# Patient Record
Sex: Male | Born: 1975 | Race: Black or African American | Hispanic: No | Marital: Married | Smoking: Former smoker
Health system: Southern US, Community
[De-identification: ages and names within clinical notes are randomized; demographics above are authoritative.]

## PROBLEM LIST (undated history)

## (undated) DIAGNOSIS — K920 Hematemesis: Secondary | ICD-10-CM

## (undated) DIAGNOSIS — R0602 Shortness of breath: Secondary | ICD-10-CM

## (undated) DIAGNOSIS — K219 Gastro-esophageal reflux disease without esophagitis: Secondary | ICD-10-CM

## (undated) DIAGNOSIS — J439 Emphysema, unspecified: Secondary | ICD-10-CM

## (undated) DIAGNOSIS — K921 Melena: Secondary | ICD-10-CM

## (undated) DIAGNOSIS — G709 Myoneural disorder, unspecified: Secondary | ICD-10-CM

## (undated) DIAGNOSIS — I10 Essential (primary) hypertension: Principal | ICD-10-CM

## (undated) DIAGNOSIS — Q8501 Neurofibromatosis, type 1: Secondary | ICD-10-CM

## (undated) DIAGNOSIS — F1010 Alcohol abuse, uncomplicated: Secondary | ICD-10-CM

## (undated) DIAGNOSIS — J45909 Unspecified asthma, uncomplicated: Secondary | ICD-10-CM

## (undated) DIAGNOSIS — R42 Dizziness and giddiness: Secondary | ICD-10-CM

## (undated) DIAGNOSIS — J449 Chronic obstructive pulmonary disease, unspecified: Secondary | ICD-10-CM

## (undated) DIAGNOSIS — J40 Bronchitis, not specified as acute or chronic: Secondary | ICD-10-CM

## (undated) DIAGNOSIS — Z87891 Personal history of nicotine dependence: Secondary | ICD-10-CM

## (undated) DIAGNOSIS — G479 Sleep disorder, unspecified: Secondary | ICD-10-CM

## (undated) HISTORY — DX: Essential (primary) hypertension: I10

## (undated) HISTORY — DX: Chronic obstructive pulmonary disease, unspecified: J44.9

## (undated) HISTORY — DX: Neurofibromatosis, type 1: Q85.01

## (undated) HISTORY — DX: Bronchitis, not specified as acute or chronic: J40

## (undated) HISTORY — PX: UPPER GASTROINTESTINAL ENDOSCOPY: SHX188

## (undated) HISTORY — DX: Personal history of nicotine dependence: Z87.891

## (undated) HISTORY — DX: Unspecified asthma, uncomplicated: J45.909

## (undated) HISTORY — DX: Emphysema, unspecified: J43.9

## (undated) HISTORY — PX: OTHER SURGICAL HISTORY: SHX169

## (undated) HISTORY — DX: Myoneural disorder, unspecified: G70.9

---

## 1998-10-23 ENCOUNTER — Emergency Department (HOSPITAL_COMMUNITY): Admission: EM | Admit: 1998-10-23 | Discharge: 1998-10-23 | Payer: Self-pay | Admitting: Emergency Medicine

## 1999-06-29 ENCOUNTER — Emergency Department (HOSPITAL_COMMUNITY): Admission: EM | Admit: 1999-06-29 | Discharge: 1999-06-30 | Payer: Self-pay

## 2002-07-30 ENCOUNTER — Emergency Department (HOSPITAL_COMMUNITY): Admission: EM | Admit: 2002-07-30 | Discharge: 2002-07-30 | Payer: Self-pay | Admitting: Emergency Medicine

## 2004-11-05 ENCOUNTER — Emergency Department (HOSPITAL_COMMUNITY): Admission: EM | Admit: 2004-11-05 | Discharge: 2004-11-05 | Payer: Self-pay | Admitting: *Deleted

## 2005-01-30 ENCOUNTER — Emergency Department (HOSPITAL_COMMUNITY): Admission: EM | Admit: 2005-01-30 | Discharge: 2005-01-30 | Payer: Self-pay | Admitting: Emergency Medicine

## 2006-04-04 ENCOUNTER — Emergency Department (HOSPITAL_COMMUNITY): Admission: EM | Admit: 2006-04-04 | Discharge: 2006-04-04 | Payer: Self-pay | Admitting: Emergency Medicine

## 2006-04-06 ENCOUNTER — Emergency Department (HOSPITAL_COMMUNITY): Admission: EM | Admit: 2006-04-06 | Discharge: 2006-04-06 | Payer: Self-pay | Admitting: Emergency Medicine

## 2011-09-04 ENCOUNTER — Other Ambulatory Visit: Payer: Self-pay

## 2011-09-04 ENCOUNTER — Encounter (HOSPITAL_COMMUNITY): Payer: Self-pay

## 2011-09-04 ENCOUNTER — Emergency Department (HOSPITAL_COMMUNITY)
Admission: EM | Admit: 2011-09-04 | Discharge: 2011-09-04 | Disposition: A | Discharge status: Home / Self Care | Payer: Self-pay | Attending: Emergency Medicine | Admitting: Emergency Medicine

## 2011-09-04 ENCOUNTER — Emergency Department (HOSPITAL_COMMUNITY): Payer: Self-pay

## 2011-09-04 DIAGNOSIS — F17200 Nicotine dependence, unspecified, uncomplicated: Secondary | ICD-10-CM | POA: Insufficient documentation

## 2011-09-04 DIAGNOSIS — L909 Atrophic disorder of skin, unspecified: Secondary | ICD-10-CM | POA: Insufficient documentation

## 2011-09-04 DIAGNOSIS — R0789 Other chest pain: Secondary | ICD-10-CM

## 2011-09-04 DIAGNOSIS — R071 Chest pain on breathing: Secondary | ICD-10-CM | POA: Insufficient documentation

## 2011-09-04 LAB — COMPREHENSIVE METABOLIC PANEL
Alkaline Phosphatase: 93 U/L (ref 39–117)
BUN: 8 mg/dL (ref 6–23)
Chloride: 98 mEq/L (ref 96–112)
Creatinine, Ser: 0.83 mg/dL (ref 0.50–1.35)
GFR calc Af Amer: >90 mL/min (ref >90)
GFR calc non Af Amer: >90 mL/min (ref >90)
Glucose, Bld: 105 mg/dL — ABNORMAL HIGH (ref 70–99)
Potassium: 4.7 mEq/L (ref 3.5–5.1)
Total Bilirubin: 0.4 mg/dL (ref 0.3–1.2)

## 2011-09-04 LAB — POCT I-STAT TROPONIN I: Troponin i, poc: 0 ng/mL (ref 0.00–0.08)

## 2011-09-04 LAB — CBC
HCT: 46.6 % (ref 39.0–52.0)
Hemoglobin: 16.2 g/dL (ref 13.0–17.0)
MCH: 32.3 pg (ref 26.0–34.0)
MCHC: 34.8 g/dL (ref 30.0–36.0)
RBC: 5.02 MIL/uL (ref 4.22–5.81)

## 2011-09-04 LAB — DIFFERENTIAL
Lymphs Abs: 1.9 10*3/uL (ref 0.7–4.0)
Monocytes Absolute: 0.8 10*3/uL (ref 0.1–1.0)
Monocytes Relative: 11 % (ref 3–12)
Neutro Abs: 3.9 10*3/uL (ref 1.7–7.7)
Neutrophils Relative %: 55 % (ref 43–77)

## 2011-09-04 MED ORDER — NAPROXEN 500 MG PO TABS
500.0000 mg | ORAL_TABLET | Freq: Once | ORAL | Status: AC
  Administered 2011-09-04: 500 mg via ORAL
  Filled 2011-09-04: qty 1

## 2011-09-04 MED ORDER — NAPROXEN 500 MG PO TABS: 500.0000 mg | ORAL_TABLET | Freq: Two times a day (BID) | ORAL | Status: DC

## 2011-09-04 MED ORDER — HYDROCODONE-ACETAMINOPHEN 5-325 MG PO TABS: ORAL_TABLET | ORAL | Status: AC

## 2011-09-04 NOTE — ED Notes (Signed)
Pt in from home with left side chest pain states ongoing the past 2 days describes as tightness denies n/v denies sob denies pain radiating to the arm

## 2011-09-04 NOTE — ED Provider Notes (Signed)
History     CSN: [id number]  Arrival date & time 09/04/11  1159   First MD Initiated Contact with Patient 09/04/11 1611      Chief Complaint  Patient presents with  . Chest Pain    stataes left side of ribs    (Consider location/radiation/quality/duration/timing/severity/associated sxs/prior treatment) HPI Comments: Patient presents with constant, reproducible, aching chest pain to the middle of his chest for the past 2 days.  No injury or initiating event at onset. The pain is worse with movement and with deep breathing. Patient also complains of left rib pain as well. No history of high blood pressure, high cholesterol, diabetes, family history. Patient smokes cigarettes. Patient denies history of blood clots, recent immobilizations or surgeries, lower extremity edema. No fever, cough, shortness of breath, abdominal pain, nausea/vomiting/diarrhea, urinary symptoms. No blood in urine. Denies elicit drug or cocaine use.  Patient is a 36 y.o. male presenting with chest pain. The history is provided by the patient.  Chest Pain The chest pain began 2 days ago. Chest pain occurs constantly. The chest pain is unchanged. Associated with: nothing. The severity of the pain is mild. The quality of the pain is described as aching. The pain does not radiate. Pertinent negatives for primary symptoms include no fever, no shortness of breath, no cough, no palpitations, no abdominal pain, no nausea and no vomiting.  Pertinent negatives for associated symptoms include no diaphoresis. Risk factors include male gender.     History reviewed. No pertinent past medical history.  History reviewed. No pertinent past surgical history.  No family history on file.  History  Substance Use Topics  . Smoking status: Current Everyday Smoker  . Smokeless tobacco: Not on file  . Alcohol Use: No      Review of Systems  Constitutional: Negative for fever and diaphoresis.  HENT: Negative for neck pain.     Eyes: Negative for redness.  Respiratory: Negative for cough and shortness of breath.   Cardiovascular: Positive for chest pain. Negative for palpitations and leg swelling.  Gastrointestinal: Negative for nausea, vomiting and abdominal pain.  Genitourinary: Negative for dysuria and hematuria.  Musculoskeletal: Negative for back pain.  Skin: Negative for rash.  Neurological: Negative for syncope and light-headedness.    Allergies  Review of patient's allergies indicates no known allergies.  Home Medications  No current outpatient prescriptions on file.  There were no vitals taken for this visit.  Physical Exam  Nursing note and vitals reviewed. Constitutional: He is oriented to person, place, and time. He appears well-developed and well-nourished.  HENT:  Head: Normocephalic and atraumatic.  Mouth/Throat: Mucous membranes are normal. Mucous membranes are not dry.  Eyes: Conjunctivae are normal.  Neck: Trachea normal and normal range of motion. Neck supple. Normal carotid pulses and no JVD present. No muscular tenderness present. Carotid bruit is not present. No tracheal deviation present.  Cardiovascular: Normal rate, regular rhythm, S1 normal, S2 normal, normal heart sounds and intact distal pulses.  Exam reveals no distant heart sounds and no decreased pulses.   No murmur heard. Pulses:      Radial pulses are 2+ on the right side, and 2+ on the left side.  Pulmonary/Chest: Effort normal and breath sounds normal. No respiratory distress. He has no wheezes. He exhibits tenderness.       Reproducible tenderness over sternum. No trauma noted. Multiple skin tags noted.   Abdominal: Soft. Normal aorta and bowel sounds are normal. There is no tenderness. There is no  rebound, no guarding and no CVA tenderness.  Musculoskeletal: He exhibits no edema.  Neurological: He is alert and oriented to person, place, and time.  Skin: Skin is warm and dry. He is not diaphoretic. No cyanosis. No  pallor.  Psychiatric: He has a normal mood and affect.    ED Course  Procedures (including critical care time)  Labs Reviewed  DIFFERENTIAL - Abnormal; Notable for the following:    Eosinophils Relative 6 (*)    All other components within normal limits  COMPREHENSIVE METABOLIC PANEL - Abnormal; Notable for the following:    Sodium 134 (*)    Glucose, Bld 105 (*)    AST 60 (*)    All other components within normal limits  CBC  POCT I-STAT TROPONIN I   Dg Chest 2 View  09/04/2011  *RADIOLOGY REPORT*  Clinical Data: Left-sided chest pain, smoker  CHEST - 2 VIEW  Comparison: None.  Findings: Normal cardiac silhouette and mediastinal contours.  The lungs are mildly hyperinflated.  There is mild diffuse increased conspicuity the pulmonary interstitium. No focal airspace opacities.  No pleural effusion or pneumothorax.  No acute osseous abnormalities.  IMPRESSION: Hyperexpanded lungs with increased interstitial thickening suggestive of airways disease/bronchitis. No focal airspace opacities to suggest pneumonia.  Original Report Authenticated By: Waynard Reeds, M.D.     1. Chest wall pain     4:22 PM Patient seen and examined. Work-up reviewed. CXR ordered. Medications ordered.    Date: 09/04/2011  Rate: 87  Rhythm: normal sinus rhythm  QRS Axis: normal  Intervals: normal  ST/T Wave abnormalities: normal  Conduction Disutrbances:none  Narrative Interpretation:   Old EKG Reviewed: none available  Do not suspect cardiac etiology.   Chest x-ray was reviewed by myself. No pneumothorax. Patient informed of results. Will give trial of anti-inflammatories and pain medication.   Patient was counseled to return with severe chest pain, especially if the pain is crushing or pressure-like and spreads to the arms, back, neck, or jaw, or if they have sweating, nausea, or shortness of breath with the pain. They were encouraged to call 911 with these symptoms.   They were also told to return  if their chest pain gets worse and does not go away with rest, they have an attack of chest pain lasting longer than usual despite rest and treatment with the medications their caregiver has prescribed, if they wake from sleep with chest pain or shortness of breath, if they feel dizzy or faint, if they have chest pain not typical of their usual pain, or if they have any other emergent concerns regarding their health.  The patient verbalized understanding and agreed.    MDM  Patient with chest reproducible chest wall pain, normal EKG, normal vitals.  Pain constant for 2 days. Trop neg. Do not suspect pericarditis or PNA. No concern for cardiac etiology. Do not suspect mild elevation in AST is significant or related.         Renne Crigler, [place] 09/04/11 2023

## 2011-09-04 NOTE — ED Provider Notes (Signed)
Medical screening examination/treatment/procedure(s) were performed by non-physician practitioner and as supervising physician I was immediately available for consultation/collaboration.  Talon Regala R Sher Hellinger, MD 09/04/11 2255 

## 2011-09-04 NOTE — ED Notes (Signed)
EKG GIVEN TO DR. HUNT

## 2011-09-04 NOTE — Discharge Instructions (Signed)
Please read and follow all provided instructions.  Your diagnoses today include:  1. Chest wall pain     Tests performed today include:  An EKG of your heart  A chest x-ray  Cardiac enzymes - a blood test for heart muscle damage  Blood counts and electrolytes  Vital signs. See below for your results today.   Medications prescribed:   Vicodin (hydrocodone/acetaminophen) for pain - narcotic pain medication  If you have been prescribed narcotic pain medication such as Vicodin or Percocet: DO NOT drive or perform any activities that require you to be awake and alert because this medicine can make you drowsy. Do not take any other medications that contain Tylenol (also called acetaminophen) while taking this medication because you might take too much.    Naproxen - anti-inflammatory pain medicine  Do not exceed 2 tablets (1000mg ) in 24 hours  Take any prescribed medications only as directed.  Follow-up instructions: Please follow-up with your primary care provider as soon as you can for further evaluation of your symptoms. If you do not have a primary care doctor -- see below for referral information.   Return instructions:  SEEK IMMEDIATE MEDICAL ATTENTION IF:  You have severe chest pain, especially if the pain is crushing or pressure-like and spreads to the arms, back, neck, or jaw, or if you have sweating, nausea (feeling sick to your stomach), or shortness of breath. THIS IS AN EMERGENCY. Don't wait to see if the pain will go away. Get medical help at once. Call 911 or 0 (operator). DO NOT drive yourself to the hospital.   Your chest pain gets worse and does not go away with rest.   You have an attack of chest pain lasting longer than usual, despite rest and treatment with the medications your caregiver has prescribed.   You wake from sleep with chest pain or shortness of breath.  You feel dizzy or faint.  You have chest pain not typical of your usual pain for which you  originally saw your caregiver.   You have any other emergent concerns regarding your health.  Additional Information: Chest pain comes from many different causes. Your caregiver has diagnosed you as having chest pain that is not specific for one problem, but does not require admission.  You are at low risk for an acute heart condition or other serious illness.   Your vital signs today were: BP 141/80  Pulse 71  Resp 18  SpO2 98% If your blood pressure (BP) was elevated above 135/85 this visit, please have this repeated by your doctor within one month. -------------- No Primary Care Doctor Call [facility]  [phone] Other agencies that provide inexpensive medical care    [facility]  [phone]    [facility]  [phone]    [facility]  [phone]    [facility]  [phone]    [facility]  [phone]    [facility]  [phone] -------------- RESOURCE GUIDE:  Dental Problems  Patients with Medicaid: [facility] [phone] [address]                                            [phone] [address] Phone:  [phone]  Phone:  [phone]  If unable to pay or uninsured, contact:  [facility] or [facility]. to become qualified for the adult dental clinic.  Chronic Pain Problems Contact [facility]  [phone] Patients need to be referred by their primary care doctor.  Insufficient Money for Medicine Contact [employer]:  call "211" or [facility] [phone].  Psychological Services [facility]  [phone] [facility]  [phone] [facility]   [phone] (emergency services [phone])  Substance Abuse Resources [facility]  [phone] [facility] [phone] [facility] [place] [phone] Floydene Flock  [phone] Residential & Outpatient Substance Abuse Program  [phone]  Abuse/Neglect [facility] Child Abuse Hotline [phone] [facility] Child Abuse Hotline [phone] (After Hours)  Emergency Shelter [facility] [phone]  Maternity Homes [facility] at [facility] [phone] [place] [facility] [phone]  [facility] Resources  [facility]     [employer]                          [facility] Dept. [address] [place]                       [address]      [address]  Blondell Reveal Phone:  [phone]                                   Phone:  [phone]                 Phone:  [phone]  [facility] Phone:  [phone]  [facility] Child Abuse Hotline 2482117224 [phone] (After Hours)

## 2011-10-29 ENCOUNTER — Emergency Department (HOSPITAL_COMMUNITY): Payer: Self-pay

## 2011-10-29 ENCOUNTER — Encounter (HOSPITAL_COMMUNITY): Payer: Self-pay

## 2011-10-29 ENCOUNTER — Emergency Department (HOSPITAL_COMMUNITY)
Admission: EM | Admit: 2011-10-29 | Discharge: 2011-10-29 | Disposition: A | Discharge status: Home / Self Care | Payer: Self-pay | Attending: Emergency Medicine | Admitting: Emergency Medicine

## 2011-10-29 DIAGNOSIS — X58XXXA Exposure to other specified factors, initial encounter: Secondary | ICD-10-CM | POA: Insufficient documentation

## 2011-10-29 DIAGNOSIS — Q8500 Neurofibromatosis, unspecified: Secondary | ICD-10-CM | POA: Insufficient documentation

## 2011-10-29 DIAGNOSIS — S4490XA Injury of unspecified nerve at shoulder and upper arm level, unspecified arm, initial encounter: Secondary | ICD-10-CM | POA: Insufficient documentation

## 2011-10-29 DIAGNOSIS — J438 Other emphysema: Secondary | ICD-10-CM | POA: Insufficient documentation

## 2011-10-29 DIAGNOSIS — F17200 Nicotine dependence, unspecified, uncomplicated: Secondary | ICD-10-CM | POA: Insufficient documentation

## 2011-10-29 DIAGNOSIS — R0789 Other chest pain: Secondary | ICD-10-CM

## 2011-10-29 DIAGNOSIS — R071 Chest pain on breathing: Secondary | ICD-10-CM | POA: Insufficient documentation

## 2011-10-29 DIAGNOSIS — R079 Chest pain, unspecified: Secondary | ICD-10-CM | POA: Insufficient documentation

## 2011-10-29 LAB — POCT I-STAT, CHEM 8
Calcium, Ion: 1.23 mmol/L (ref 1.12–1.32)
Glucose, Bld: 85 mg/dL (ref 70–99)
HCT: 48 % (ref 39.0–52.0)
Hemoglobin: 16.3 g/dL (ref 13.0–17.0)
TCO2: 22 mmol/L (ref 0–100)

## 2011-10-29 MED ORDER — GADOBENATE DIMEGLUMINE 529 MG/ML IV SOLN
15.0000 mL | Freq: Once | INTRAVENOUS | Status: AC
  Administered 2011-10-29: 15 mL via INTRAVENOUS

## 2011-10-29 MED ORDER — IOHEXOL 300 MG/ML  SOLN
80.0000 mL | Freq: Once | INTRAMUSCULAR | Status: AC | PRN
  Administered 2011-10-29: 80 mL via INTRAVENOUS

## 2011-10-29 MED ORDER — HYDROCODONE-ACETAMINOPHEN 5-325 MG PO TABS: 1.0000 | ORAL_TABLET | ORAL | Status: AC | PRN

## 2011-10-29 MED ORDER — KETOROLAC TROMETHAMINE 60 MG/2ML IM SOLN
60.0000 mg | Freq: Once | INTRAMUSCULAR | Status: AC
  Administered 2011-10-29: 60 mg via INTRAMUSCULAR
  Filled 2011-10-29: qty 2

## 2011-10-29 NOTE — ED Provider Notes (Addendum)
History  This chart was scribed for James Racer, MD by [name]. This patient was seen in room STRE3/STRE3 and the patient's care was started at 1:21PM.  CSN: [id number]  Arrival date & time 10/29/11  1135   First MD Initiated Contact with Patient 10/29/11 1321      Chief Complaint  Patient presents with  . Chest Pain    Patient is a 36 y.o. male presenting with chest pain. The history is provided by the patient. No language interpreter was used.  Chest Pain The chest pain began 1 - 2 weeks ago. Chest pain occurs constantly. The chest pain is worsening. At its most intense, the pain is at 8/10. The pain is currently at 5/10. The pain does not radiate. Pertinent negatives for primary symptoms include no fever, no shortness of breath, no cough, no nausea and no vomiting.  Associated symptoms include numbness and weakness. Risk factors include smoking/tobacco exposure.  Pertinent negatives for past medical history include no CAD and no MI.     James [name] is a 35 y.o. male with no h/o chronic medical conditions who presents to the Emergency Department complaining of 2 weeks of gradual onset, gradually worsening, constant left-sided chest pain with associated intermittent left arm numbness that pt states he woke up with. The pain is worse with touch and pressure. Pt states that the arm numbness is worse with lifting up his left arm. He states that he was recently diagnosed with bronchitis by his PCP. He denies injury as the cause of the symptoms. Pt states that he was seen at [facility] and by his PCP for the symptoms. He states that he was prescribed Vicodin after trying Naproxen and Prednisone with no improvement in his symptoms. He reports that he has been taking Vicodin with moderate improvement in symptoms.  He denies nausea, diaphoresis and weakness as associated symptoms. He is a current everyday smoker but denies alcohol use.  No past medical history on file.  No past surgical  history on file.  No family history on file.  History  Substance Use Topics  . Smoking status: Current Everyday Smoker  . Smokeless tobacco: Not on file  . Alcohol Use: No      Review of Systems  Constitutional: Negative for fever and chills.  Respiratory: Negative for cough and shortness of breath.   Cardiovascular: Positive for chest pain.  Gastrointestinal: Negative for nausea and vomiting.  Neurological: Positive for weakness and numbness.    Allergies  Review of patient's allergies indicates no known allergies.  Home Medications   Current Outpatient Rx  Name Route Sig Dispense Refill  . HYDROCODONE-ACETAMINOPHEN 5-500 MG PO TABS Oral Take 1 tablet by mouth 2 (two) times daily as needed. For pain      Triage Vitals: BP 139/91  Pulse 85  Temp(Src) 98.2 F (36.8 C) (Oral)  Resp 20  SpO2 96%  Physical Exam  Nursing note and vitals reviewed. Constitutional: He is oriented to person, place, and time. He appears well-developed and well-nourished. No distress.  HENT:  Head: Normocephalic and atraumatic.  Eyes: EOM are normal.  Neck: Neck supple. No tracheal deviation present.  Cardiovascular: Normal rate and regular rhythm.   Pulmonary/Chest: Effort normal and breath sounds normal. No respiratory distress. He exhibits tenderness (left parasternal chest tenderness to palpation).  Musculoskeletal: Normal range of motion.       4/5 weakness in left grip strength   Neurological: He is alert and oriented to person, place, and  time.       Generalized decreased sensation in LUE  Skin: Skin is warm and dry.       Lesions on the arms that appear to be neurofibromas   Psychiatric: He has a normal mood and affect. His behavior is normal.    ED Course  Procedures (including critical care time)  DIAGNOSTIC STUDIES: Oxygen Saturation is 96% on room air, adequate by my interpretation.    COORDINATION OF CARE: 1:30PM-Discussed treatment plan which includes chest x-ray with  pt and pt agreed to plan. Advised pt to try ibuprofen for the symptoms. Pt is requesting medication now for the pain. 2:21PM-Informed pt of CT scan and pt agreed.  2:41PM-Pt rechecked and states that he has a brother with a h/o neurofibromatosis.  5:08PM-Informed pt of radiology results and of radiologist's recommendation to have a MRI performed. Pt agreed to MRI.  7:41PM-Informed pt of MRI results. Discussed discharge plan with pt and pt agreed to plan. Advised pt to follow up with PCP.   Labs Reviewed  POCT I-STAT, CHEM 8 - Abnormal; Notable for the following:    BUN 4 (*)    All other components within normal limits  POCT I-STAT TROPONIN I   Dg Chest 2 View  10/29/2011  *RADIOLOGY REPORT*  Clinical Data: Chest pain.  CHEST - 2 VIEW  Comparison: Chest radiograph 09/04/2011  Findings: Lung expansion is upper normal to mildly increased.  This could be due to good inspiratory effort versus mild hyperinflation. The lungs are clear.  There is no pleural effusion or pneumothorax. The bones appear within normal limits.  IMPRESSION: Mild hyperexpansion versus good inspiratory effort on the part of the patient.  The lungs are clear.  Original Report Authenticated By: Britta Mccreedy, M.D.   Ct Chest W Contrast  10/29/2011  *RADIOLOGY REPORT*  Clinical Data: Chest pain  CT CHEST WITH CONTRAST  Technique:  Multidetector CT imaging of the chest was performed following the standard protocol during bolus administration of intravenous contrast.  Contrast: 80mL OMNIPAQUE IOHEXOL 300 MG/ML  SOLN  Comparison: Chest radiograph 06/03/2013and CT abdomen 04/06/2006  Findings: Bilateral gynecomastia.  Heart size is normal. Negative for visible coronary artery calcifications.  Thoracic aorta is normal in caliber and enhancement.  Negative for aortic dissection. Great vessels, visualized portion of the thyroid gland, and esophagus have normal appearance.  Negative for hiatal hernia.  Negative for pleural pericardial effusion,  or lymphadenopathy in the chest.  On image #13 of the soft tissue windows is a pleural-based and smoothly marginated soft tissue nodule that measures 10 mm greatest diameter.  There is a focal area of skin thickening or focal skin lesion in the upper right chest, inferior to the clavicle.  Question skin mole. This is seen on image #6 of the axial images.  Suggest direct visualization.  There is paraseptal emphysema, with bullous changes at the lung apices, right greater than left. Additionally, there is mild centrilobular emphysema in the upper lobes bilaterally, age advanced.  There is no airspace disease, pulmonary mass, or pulmonary nodule.  There is some paraspinous soft tissue/fat density prominence, predominately to the left of midline, spanning from the T1-T2 disc level through the T2-T3 disc level. The left aspect of the T1 and T2 vertebral bodies have focal erosions with sclerotic margins, that have chronic appearances.  No acute bony abnormality is identified.  The imaged portion of the liver, adrenal glands, spleen, both kidneys, and pancreas are within normal limits.  IMPRESSION: 1.  Mild  centrilobular emphysema and paracentral emphysema. Emphysematous changes are age-advanced with the 35 the patient. 2.  Abnormal paraspinous soft tissue prominence centered at the T2 level.  The adjacent upper thoracic spine vertebral bodies have erosions with sclerotic margins. Findings are nonspecific, but potential considerations include a neurofibroma or schwannoma. Malignancy cannot be excluded.  Further evaluation with thoracic spine MRI with and without contrast is suggested. 3. Small, nonspecific left pleural based nodule adjacent to the third rib anteriorly. Question if this could have a similar etiology as the paraspinous soft tissue density.    3. Gynecomastia. 4.  Focal anterior right upper chest exophytic skin lesion. Suggest direct visualization.  Original Report Authenticated By: Britta Mccreedy, M.D.   Mr  Thoracic Spine W Wo Contrast  10/29/2011  *RADIOLOGY REPORT*  Clinical Data: Chest pain.  Left hand numbness. Multiple cutaneous skin tags.  History of a brother with neurofibromatosis.  MRI THORACIC SPINE WITHOUT AND WITH CONTRAST  Technique:  Multiplanar and multiecho pulse sequences of the thoracic spine were obtained without and with intravenous contrast.  Contrast: 15mL MULTIHANCE GADOBENATE DIMEGLUMINE 529 MG/ML IV SOLN  Comparison: Chest x-ray and CT chest earlier today.  Findings: As  demonstrated on prior imaging, there is a medial left paratracheal/apical lesion with scalloping of the adjacent vertebral bodies T1-T4.  The lesion surrounds the upper ribs but does not clearly result in osseous destruction or remodeling of those ribs. The lesion measures approximately 27 x 37 x 44 mm in cross section and demonstrates avid contrast enhancement. Slight enhancement can be seen in the T1-2 and T2-3 neural foramina, but there is no clear-cut foraminal widening.  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  No evidence for lateral meningocele, intraspinal meningioma, or dumbbell schwannoma.  Slight 2 mm anterolisthesis T1 and T2.  Mild annular bulging T1-2 disc space and T2-3 disc space.  No intraspinal lesions or neural foraminal narrowing. No intraspinal meningioma.  Apical bullous change in both lung fields.  No mediastinal masses.  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  No cord compression or intraspinal mass lesion.  It is possible that due to the proximity of this lesion to the brachial plexus, there may be some tethering of the lower cervical roots or trunks related to involvement of this plexiform  lesion.  Findings discussed with EDP.  Original Report Authenticated By: Elsie Stain, M.D.   Dg Shoulder Left  10/29/2011  *RADIOLOGY REPORT*  Clinical Data: Left chest and shoulder pain.  LEFT SHOULDER - 2+ VIEW  Comparison: Chest radiograph 09/04/2011  Findings: Three views of the left shoulder are negative for acute fracture or dislocation.  There may be a smooth curved pleural- based density near the left lateral 3rd rib.  This was present on the previous chest radiograph.  Negative for a pneumothorax.  IMPRESSION: No acute bony abnormality in the left shoulder.  There is a smooth density near the left lateral 3rd rib. It is unclear if this is extrapleural or pleural-based.  This could represent a lipoma but nonspecific.  If this is an area of clinical concern, recommend chest CT.  Original Report Authenticated By: Richarda Overlie, M.D.     No diagnosis found.   Date: 12/01/2011  Rate: 82  Rhythm: normal sinus rhythm  QRS Axis: normal  Intervals: normal  ST/T Wave abnormalities: nonspecific ST changes  Conduction Disutrbances:none  Narrative Interpretation:   Old EKG Reviewed: none available    MDM  I personally performed the services described in this documentation, which was scribed in my presence. The recorded information has been reviewed and considered.  Pt advised to F/u with PMD for specialist referrals as needed  James Racer, MD 10/29/11 1952  James Racer, MD 12/01/11 [phone]

## 2011-10-29 NOTE — ED Notes (Signed)
Pt complains of chest pain and left side numbness, seen at [facility] and pmd for same and no treatnement is helping.

## 2011-10-29 NOTE — Discharge Instructions (Signed)
Neurofibromatosis Neurofibromatosis is a genetic disorder of the nervous system. They mostly affect the development and growth of nerve cell tissues. This disorder causes tumors to grow on nerves. It can also cause skin changes and bone deformities. Scientists have classified the disorders as neurofibromatosis type 1 (NF1) and neurofibromatosis type 2 (NF2). Of the two, NF1 is the more common type. CAUSES  Many affected persons inherit the disorder. But between 30 and 50 percent of new cases arise through change (mutation) in an individual's genes. Once this change has taken place, the mutant gene can be passed on to future generations. SYMPTOMS  Early symptoms may include:  Ringing noise in the ear (tinnitus).   Poor balance.  Problems caused by pressure from the tumors include:  Headache.   Facial pain or numbness.   Symptoms of NF1, particularly those on the skin, are often evident at birth or during infancy. They almost always appear by the time a child is about 33 years old.   NF2 has bilateral (occurring on both sides of the body) tumors on the eighth cranial nerve. The tumors cause pressure damage to neighboring nerves.  DIAGNOSIS  To diagnose NF1, a caregiver looks for:  Changes in skin appearance.   Tumors.   Bone abnormalities.   A parent, sibling, or child with NF1.  To diagnose NF2, a caregiver looks for:  Bilateral eighth nerve tumors.   Similar signs and symptoms in a parent, sibling, or child. Affected individuals may notice hearing loss as early as the teen years.  TREATMENT  Treatments for both NF1 and NF2 are presently aimed at controlling symptoms.  Surgery can help some NF1 bone malformations. It can remove painful or disfiguring tumors. However, there is a chance that the tumors may grow back and in greater numbers. In the rare instances when tumors become malignant (3 to 5 percent of all cases), treatment may include surgery, radiation, or chemotherapy.    For NF2, improved diagnostic technologies, such as MRI, can reveal tumors as small as a few millimeters in diameter. This allows for early treatment. Surgery to remove tumors completely is one option. But it may result in hearing loss. Other options include partial removal of tumors or radiation. If the tumors are not progressing rapidly, the conservative approach is watchful waiting.   Genetic testing is available for families with documented cases of NF1 and NF2.   New (spontaneous) mutations cannot be confirmed genetically.   Prenatal diagnosis of familial NF1 or NF2 is also possible. Amniocentesis or chorionic villus sampling procedures would be used.  Document Released: 05/04/2002 Document Revised: 05/03/2011 Document Reviewed: 05/14/2005 [facility] Patient Information 2012 [place], [place].

## 2011-10-29 NOTE — ED Notes (Signed)
Also reports htn

## 2011-11-11 ENCOUNTER — Emergency Department (HOSPITAL_COMMUNITY)
Admission: EM | Admit: 2011-11-11 | Discharge: 2011-11-11 | Disposition: A | Discharge status: Home / Self Care | Payer: Medicaid Other | Attending: Emergency Medicine | Admitting: Emergency Medicine

## 2011-11-11 ENCOUNTER — Encounter (HOSPITAL_COMMUNITY): Payer: Self-pay | Admitting: Emergency Medicine

## 2011-11-11 ENCOUNTER — Emergency Department (HOSPITAL_COMMUNITY): Payer: Medicaid Other

## 2011-11-11 DIAGNOSIS — K2970 Gastritis, unspecified, without bleeding: Secondary | ICD-10-CM

## 2011-11-11 DIAGNOSIS — F17200 Nicotine dependence, unspecified, uncomplicated: Secondary | ICD-10-CM | POA: Insufficient documentation

## 2011-11-11 DIAGNOSIS — R109 Unspecified abdominal pain: Secondary | ICD-10-CM | POA: Insufficient documentation

## 2011-11-11 LAB — HEPATIC FUNCTION PANEL
ALT: 61 U/L — ABNORMAL HIGH (ref 0–53)
Alkaline Phosphatase: 89 U/L (ref 39–117)
Bilirubin, Direct: <0.1 mg/dL (ref 0.0–0.3)

## 2011-11-11 LAB — URINALYSIS, ROUTINE W REFLEX MICROSCOPIC
Bilirubin Urine: NEGATIVE
Hgb urine dipstick: NEGATIVE
Ketones, ur: 15 mg/dL — AB
Nitrite: NEGATIVE
Specific Gravity, Urine: 1.007 (ref 1.005–1.030)
Urobilinogen, UA: 0.2 mg/dL (ref 0.0–1.0)

## 2011-11-11 LAB — TYPE AND SCREEN
ABO/RH(D): O POS
Antibody Screen: NEGATIVE

## 2011-11-11 LAB — BASIC METABOLIC PANEL
BUN: 5 mg/dL — ABNORMAL LOW (ref 6–23)
Calcium: 10 mg/dL (ref 8.4–10.5)
GFR calc Af Amer: >90 mL/min (ref >90)
GFR calc non Af Amer: >90 mL/min (ref >90)
Glucose, Bld: 111 mg/dL — ABNORMAL HIGH (ref 70–99)
Sodium: 132 mEq/L — ABNORMAL LOW (ref 135–145)

## 2011-11-11 LAB — DIFFERENTIAL
Basophils Relative: 1 % (ref 0–1)
Eosinophils Absolute: 0.3 10*3/uL (ref 0.0–0.7)
Eosinophils Relative: 4 % (ref 0–5)
Lymphs Abs: 2 10*3/uL (ref 0.7–4.0)
Monocytes Relative: 13 % — ABNORMAL HIGH (ref 3–12)
Neutrophils Relative %: 54 % (ref 43–77)

## 2011-11-11 LAB — CBC
MCH: 32.3 pg (ref 26.0–34.0)
MCHC: 35.6 g/dL (ref 30.0–36.0)
MCV: 90.9 fL (ref 78.0–100.0)
Platelets: 244 10*3/uL (ref 150–400)
RBC: 4.95 MIL/uL (ref 4.22–5.81)

## 2011-11-11 MED ORDER — PANTOPRAZOLE SODIUM 20 MG PO TBEC: 20.0000 mg | DELAYED_RELEASE_TABLET | Freq: Every day | ORAL | Status: DC

## 2011-11-11 MED ORDER — SUCRALFATE 1 G PO TABS: 1.0000 g | ORAL_TABLET | Freq: Four times a day (QID) | ORAL | Status: DC

## 2011-11-11 MED ORDER — PANTOPRAZOLE SODIUM 40 MG IV SOLR
40.0000 mg | Freq: Once | INTRAVENOUS | Status: AC
  Administered 2011-11-11: 40 mg via INTRAVENOUS
  Filled 2011-11-11: qty 40

## 2011-11-11 NOTE — ED Provider Notes (Signed)
History     CSN: [id number]  Arrival date & time 11/11/11  2005   First MD Initiated Contact with Patient 11/11/11 2046      Chief Complaint  Patient presents with  . Hematemesis  . Abdominal Pain    (Consider location/radiation/quality/duration/timing/severity/associated sxs/prior treatment) Patient is a 36 y.o. male presenting with abdominal pain. The history is provided by the patient.  Abdominal Pain The primary symptoms of the illness include abdominal pain.  pt here with vomiting 25cc of bright red blood. Denies coffee ground emesis or dark stools. Admits to heavy daily etoh use. No fever. No prior h/o same. No tx don't prior to arrival. Slight sharp epigastric pain without radiation.   No past medical history on file.  No past surgical history on file.  No family history on file.  History  Substance Use Topics  . Smoking status: Current Everyday Smoker -- 0.5 packs/day  . Smokeless tobacco: Not on file  . Alcohol Use: No      Review of Systems  Gastrointestinal: Positive for abdominal pain.  All other systems reviewed and are negative.    Allergies  Review of patient's allergies indicates no known allergies.  Home Medications   Current Outpatient Rx  Name Route Sig Dispense Refill  . HYDROCODONE-ACETAMINOPHEN 5-500 MG PO TABS Oral Take 1 tablet by mouth 2 (two) times daily as needed. For pain      BP 135/79  Pulse 75  Temp 98.2 F (36.8 C) (Oral)  SpO2 100%  Physical Exam  Nursing note and vitals reviewed. Constitutional: He is oriented to person, place, and time. He appears well-developed and well-nourished.  Non-toxic appearance. No distress.  HENT:  Head: Normocephalic and atraumatic.  Eyes: Conjunctivae, EOM and lids are normal. Pupils are equal, round, and reactive to light.  Neck: Normal range of motion. Neck supple. No tracheal deviation present. No mass present.  Cardiovascular: Normal rate, regular rhythm and normal heart sounds.  Exam  reveals no gallop.   No murmur heard. Pulmonary/Chest: Effort normal and breath sounds normal. No stridor. No respiratory distress. He has no decreased breath sounds. He has no wheezes. He has no rhonchi. He has no rales.  Abdominal: Soft. Normal appearance and bowel sounds are normal. He exhibits no distension. There is no tenderness. There is no rigidity, no rebound, no guarding and no CVA tenderness.  Musculoskeletal: Normal range of motion. He exhibits no edema and no tenderness.  Neurological: He is alert and oriented to person, place, and time. He has normal strength. No cranial nerve deficit or sensory deficit. GCS eye subscore is 4. GCS verbal subscore is 5. GCS motor subscore is 6.  Skin: Skin is warm and dry. No abrasion and no rash noted.  Psychiatric: He has a normal mood and affect. His speech is normal and behavior is normal.    ED Course  Procedures (including critical care time)  Labs Reviewed  DIFFERENTIAL - Abnormal; Notable for the following:    Monocytes Relative 13 (*)     All other components within normal limits  CBC  TYPE AND SCREEN  URINALYSIS, ROUTINE W REFLEX MICROSCOPIC  BASIC METABOLIC PANEL  LIPASE, BLOOD  ABO/RH  HEPATIC FUNCTION PANEL   No results found.   No diagnosis found.    MDM  Pt given iv fluids and protonix--suspect pt has gastritis from his etoh use, will place on ppi and carafate        Toy Baker, MD 11/11/11 2315

## 2011-11-11 NOTE — Discharge Instructions (Signed)
Gastritis Gastritis is an inflammation (the body's way of reacting to injury and/or infection) of the stomach. It is often caused by viral or bacterial (germ) infections. It can also be caused by chemicals (including alcohol) and medications. This illness may be associated with generalized malaise (feeling tired, not well), cramps, and fever. The illness may last 2 to 7 days. If symptoms of gastritis continue, gastroscopy (looking into the stomach with a telescope-like instrument), biopsy (taking tissue samples), and/or blood tests may be necessary to determine the cause. Antibiotics will not affect the illness unless there is a bacterial infection present. One common bacterial cause of gastritis is an organism known as H. Pylori. This can be treated with antibiotics. Other forms of gastritis are caused by too much acid in the stomach. They can be treated with medications such as H2 blockers and antacids. Home treatment is usually all that is needed. Young children will quickly become dehydrated (loss of body fluids) if vomiting and diarrhea are both present. Medications may be given to control nausea. Medications are usually not given for diarrhea unless especially bothersome. Some medications slow the removal of the virus from the gastrointestinal tract. This slows down the healing process. HOME CARE INSTRUCTIONS Home care instructions for nausea and vomiting:  For adults: drink small amounts of fluids often. Drink at least 2 quarts a day. Take sips frequently. Do not drink large amounts of fluid at one time. This may worsen the nausea.   Only take over-the-counter or prescription medicines for pain, discomfort, or fever as directed by your caregiver.   Drink clear liquids only. Those are anything you can see through such as water, broth, or soft drinks.   Once you are keeping clear liquids down, you may start full liquids, soups, juices, and ice cream or sherbet. Slowly add bland (plain, not spicy)  foods to your diet.  Home care instructions for diarrhea:  Diarrhea can be caused by bacterial infections or a virus. Your condition should improve with time, rest, fluids, and/or anti-diarrheal medication.   Until your diarrhea is under control, you should drink clear liquids often in small amounts. Clear liquids include: water, broth, jell-o water and weak tea.  Avoid:  Milk.   Fruits.   Tobacco.   Alcohol.   Extremely hot or cold fluids.   Too much intake of anything at one time.  When your diarrhea stops you may add the following foods, which help the stool to become more formed:  Rice.   Bananas.   Apples without skin.   Dry toast.  Once these foods are tolerated you may add low-fat yogurt and low-fat cottage cheese. They will help to restore the normal bacterial balance in your bowel. Wash your hands well to avoid spreading bacteria (germ) or virus. SEEK IMMEDIATE MEDICAL CARE IF:   You are unable to keep fluids down.   Vomiting or diarrhea become persistent (constant).   Abdominal pain develops, increases, or localizes. (Right sided pain can be appendicitis. Left sided pain in adults can be diverticulitis.)   You develop a fever (an oral temperature above 102 F (38.9 C)).   Diarrhea becomes excessive or contains blood or mucus.   You have excessive weakness, dizziness, fainting or extreme thirst.   You are not improving or you are getting worse.   You have any other questions or concerns.  Document Released: 05/08/2001 Document Revised: 05/03/2011 Document Reviewed: 05/14/2005 [facility] Patient Information 2012 [place], [place].Hematemesis This condition is the vomiting of blood. CAUSES  This can happen if you have a peptic ulcer or an irritation of the throat, stomach, or small bowel. Vomiting over and over again or swallowing blood from a nosebleed, coughing or facial injury can also result in bloody vomit. Anti-inflammatory pain medicines are a common  cause of this potentially dangerous condition. The most serious causes of vomiting blood include:  Ulcers (a bacteria called H. pylori is common cause of ulcers).   Clotting problems.   Alcoholism.   Cirrhosis.  TREATMENT  Treatment depends on the cause and the severity of the bleeding. Small amounts of blood streaks in the vomit is not the same as vomiting large amounts of bloody or dark, coffee grounds-like material. Weakness, fainting, dehydration, anemia, and continued alcohol or drug use increase the risk. Examination may include blood, vomit, or stool tests. The presence of bloody or dark stool that tests positive for blood (Hemoccult) means the bleeding has been going on for some time. Endoscopy and imaging studies may be done. Emergency treatment may include:  IV medicines or fluids.   Blood transfusions.   Surgery.  Hospital care is required for high risk patients or when IV fluids or blood is needed. Upper GI bleeding can cause shock and death if not controlled. HOME CARE INSTRUCTIONS   Your treatment does not require hospital care at this time.   Remain at rest until your condition improves.   Drink clear liquids as tolerated.   Avoid:   Alcohol.   Nicotine.   Aspirin.   Any other anti-inflammatory medicine (ibuprofen, naproxen, and many others).   Medications to suppress stomach acid or vomiting may be needed. Take all your medicine as prescribed.   Be sure to see your caregiver for follow-up as recommended.  SEEK IMMEDIATE MEDICAL CARE IF:   You have repeated vomiting, dehydration, fainting, or extreme weakness.   You are vomiting large amounts of bloody or dark material.   You pass large, dark or bloody stools.  Document Released: 06/21/2004 Document Revised: 05/03/2011 Document Reviewed: 07/07/2008 [facility] Patient Information 2012 [place], [place].     Behavioral Health Resources in the [facility]  Intensive Outpatient Programs: [facility]      [address] [place], [place] [phone] Both a day and evening program       [facility] Outpatient     [address]        [place], [place] [address] [phone]         ADS: [facility] [address] [place] [phone]  [facility] Mental Health ACCESS LINE: [phone] or [phone] [address] [place], [place] [address] [email or web address]  Mobile Crisis Teams:                                        [facility]         Mobile Crisis Care Unit [phone]             [facility] [address] [place]in[place] Otto [phone]                                         [facility] [address] [place] [phone]  Self-Help/Support Groups: [facility] [employer] of support  groups [phone] (call for more info)  Narcotics Anonymous (NA) Caring [facility] [address] [place] - 2 meetings at this location  Residential Treatment Programs:  [facility]      [address]        [place]       [phone]         [facility] [address] [place] [address] [place], [place]  [address] [phone]  [facility]  [address] [place], [place] [address] [phone] Admissions: 8am-3pm M-F  [facility]     [address]        [place], [place] [address]       [phone]         [facility] [address] [place], [place] [phone]  [facility] [address] [place], [place] [phone]  [facility] - Intensive Outpatient      [address]     [place], [place]       [phone]         [facility] ([facility]     [address] [place], [place] [phone] or [phone]  [facility]idential Treatment  Services ([facility])  [address] [place], [place] [phone]  [facility] [address]                                               [address] [place] [phone]  [facility] Resources: [phone]               General Therapy                                                Angie Fava, PhD        [address] [place], [place] [address]         [phone]   Insurance  [facility]al   [address] [place], [place] [address] [phone]  [facility] [address] [place], [place] [address] [phone] Insurance/Medicaid/sponsorship through [facility]                                              [address]                                        [place], [place] [address]    Therapy/tele-psych/case         [phone]          [facility] [address] [place]  [address]  Adolescent/group home/case management [phone]  Creola Corn PhD       General therapy       Insurance   [phone]         Dr. Lolly Mustache Insurance [phone] M-F  [facility], sponsorship [phone]

## 2011-11-11 NOTE — ED Notes (Signed)
AVW:UJWJ<XB> Expected date:11/11/11<BR> Expected time:<BR> Means of arrival:<BR> Comments:<BR> EMS 261 GC - gi bleed/vomiting

## 2011-11-11 NOTE — ED Notes (Signed)
AS per EMS, pt first vomited at 1pm , then vomited at 6 wit about 25cc of bright red blood. General abd pain.

## 2011-11-11 NOTE — ED Notes (Signed)
MD at bedside. 

## 2011-12-04 ENCOUNTER — Encounter (HOSPITAL_COMMUNITY): Payer: Self-pay | Admitting: Physical Medicine and Rehabilitation

## 2011-12-04 DIAGNOSIS — F17200 Nicotine dependence, unspecified, uncomplicated: Secondary | ICD-10-CM | POA: Insufficient documentation

## 2011-12-04 DIAGNOSIS — R079 Chest pain, unspecified: Secondary | ICD-10-CM | POA: Insufficient documentation

## 2011-12-04 DIAGNOSIS — R0602 Shortness of breath: Secondary | ICD-10-CM | POA: Insufficient documentation

## 2011-12-04 DIAGNOSIS — J438 Other emphysema: Secondary | ICD-10-CM | POA: Insufficient documentation

## 2011-12-04 LAB — CBC
MCHC: 34.4 g/dL (ref 30.0–36.0)
Platelets: 211 10*3/uL (ref 150–400)
RDW: 11.2 % — ABNORMAL LOW (ref 11.5–15.5)
WBC: 5.8 10*3/uL (ref 4.0–10.5)

## 2011-12-04 LAB — POCT I-STAT TROPONIN I: Troponin i, poc: 0 ng/mL (ref 0.00–0.08)

## 2011-12-04 NOTE — ED Notes (Signed)
Pt presents to department for evaluation of midsternal non radiating chest pain. Ongoing x1 month. Also states SOB. 6/10 pain, describes as intermittent pressure. Pain becomes worse with movement and lying flat. Skin warm and dry. Respirations unlabored.

## 2011-12-05 ENCOUNTER — Emergency Department (HOSPITAL_COMMUNITY)
Admission: EM | Admit: 2011-12-05 | Discharge: 2011-12-05 | Disposition: A | Discharge status: Home / Self Care | Payer: Medicaid Other | Attending: Emergency Medicine | Admitting: Emergency Medicine

## 2011-12-05 ENCOUNTER — Emergency Department (HOSPITAL_COMMUNITY): Payer: Medicaid Other

## 2011-12-05 ENCOUNTER — Encounter (HOSPITAL_COMMUNITY): Payer: Self-pay | Admitting: Radiology

## 2011-12-05 DIAGNOSIS — R079 Chest pain, unspecified: Secondary | ICD-10-CM

## 2011-12-05 LAB — COMPREHENSIVE METABOLIC PANEL
ALT: 19 U/L (ref 0–53)
AST: 20 U/L (ref 0–37)
Albumin: 3.8 g/dL (ref 3.5–5.2)
Alkaline Phosphatase: 82 U/L (ref 39–117)
Potassium: 3.9 mEq/L (ref 3.5–5.1)
Sodium: 143 mEq/L (ref 135–145)
Total Protein: 7.2 g/dL (ref 6.0–8.3)

## 2011-12-05 MED ORDER — OXYCODONE-ACETAMINOPHEN 5-325 MG PO TABS: 1.0000 | ORAL_TABLET | Freq: Four times a day (QID) | ORAL | Status: DC | PRN

## 2011-12-05 MED ORDER — IOHEXOL 350 MG/ML SOLN
100.0000 mL | Freq: Once | INTRAVENOUS | Status: AC | PRN
  Administered 2011-12-05: 100 mL via INTRAVENOUS

## 2011-12-05 MED ORDER — OXYCODONE-ACETAMINOPHEN 5-325 MG PO TABS
1.0000 | ORAL_TABLET | Freq: Once | ORAL | Status: AC
  Administered 2011-12-05: 1 via ORAL
  Filled 2011-12-05: qty 1

## 2011-12-05 NOTE — ED Notes (Signed)
Patient returned from xray.

## 2011-12-05 NOTE — ED Notes (Signed)
Patient with on and off chest pain for the last month.  Patient does have some shortness of breath associated, worse when lying down.  Patient is CAOx3.

## 2011-12-05 NOTE — ED Provider Notes (Signed)
Medical screening examination/treatment/procedure(s) were conducted as a shared visit with non-physician practitioner(s) and myself.  I personally evaluated the patient during the encounter   James Numbers, MD 12/05/11 [phone]

## 2011-12-05 NOTE — ED Provider Notes (Signed)
CT NG returned with no new significant findings. Patient does not have any pulmonary embolus. No further workup necessary today. He will followup at family practice as previously scheduled on Thursday. A prescription for 10 tabs of Percocet worse given for pain control until that time. Patient was discharged in good condition.  . Diagnosis: Chest pain  Cyndra Numbers, MD 12/05/11 [phone]

## 2011-12-05 NOTE — ED Provider Notes (Addendum)
History     CSN: [id number]  Arrival date & time 12/04/11  2214   First MD Initiated Contact with Patient 12/05/11 0011      Chief Complaint  Patient presents with  . Chest Pain  . Shortness of Breath    (Consider location/radiation/quality/duration/timing/severity/associated sxs/prior treatment) HPI Comments: Patient is a 36 year old male with a history a recent diagnosis of neurofibroma that presents emergency department with a chief complaint of substernal chest pain.  Pain onset began about one month ago when patient was diagnosed and is described as a sharp pleuritic chest pain associated with shortness of breath that does not radiate and is located mid sternum.  Pain is worsened with certain movements including lying flat and is only minimally relieved by ibuprofen.  Severity 6/10.  Patient reports dyspnea on exertion but denies any leg swelling, cough, hemoptysis, claudication, palpitations, lightheadedness, fever, night sweats or chills.  Patient has a followup appointment scheduled with family practice this Thursday.  No other complaints at this time.   Patient is a 36 y.o. male presenting with chest pain and shortness of breath.  Chest Pain Primary symptoms include shortness of breath. Pertinent negatives for primary symptoms include no fever, no abdominal pain and no dizziness.  Pertinent negatives for associated symptoms include no numbness and no weakness.    Shortness of Breath  Associated symptoms include chest pain and shortness of breath. Pertinent negatives include no fever.    No past medical history on file.  No past surgical history on file.  History reviewed. No pertinent family history.  History  Substance Use Topics  . Smoking status: Current Everyday Smoker -- 0.5 packs/day    Types: Cigarettes  . Smokeless tobacco: Not on file  . Alcohol Use: No      Review of Systems  Constitutional: Negative for fever, chills and appetite change.  HENT:  Negative for congestion.   Eyes: Negative for visual disturbance.  Respiratory: Positive for shortness of breath.   Cardiovascular: Positive for chest pain. Negative for leg swelling.  Gastrointestinal: Negative for abdominal pain.  Genitourinary: Negative for dysuria, urgency and frequency.  Neurological: Negative for dizziness, syncope, weakness, light-headedness, numbness and headaches.  Psychiatric/Behavioral: Negative for confusion.    Allergies  Review of patient's allergies indicates no known allergies.  Home Medications  No current outpatient prescriptions on file.  BP 154/96  Pulse 74  Temp 98.6 F (37 C) (Oral)  Resp 19  SpO2 98%  Physical Exam  Nursing note and vitals reviewed. Constitutional: He appears well-developed and well-nourished. No distress.  HENT:  Head: Normocephalic and atraumatic.  Eyes: Conjunctivae and EOM are normal. Pupils are equal, round, and reactive to light.  Neck: Normal range of motion. Neck supple. Normal carotid pulses and no JVD present. Carotid bruit is not present. No rigidity. Normal range of motion present.  Cardiovascular: Normal rate, regular rhythm, S1 normal, S2 normal, normal heart sounds, intact distal pulses and normal pulses.  Exam reveals no gallop and no friction rub.   No murmur heard.      No pitting edema bilaterally, RRR, no aberrant sounds on auscultations, distal pulses intact, no carotid bruit or JVD.   Pulmonary/Chest: Effort normal and breath sounds normal. No accessory muscle usage or stridor. No respiratory distress. He exhibits tenderness. He exhibits no mass, no crepitus and no deformity.         LCAB, no acute respiratory distress. Pt able to speak full sentences without difficulty.   Abdominal: Bowel sounds  are normal.       Soft non tender. Non pulsatile aorta.   Skin: Skin is warm, dry and intact. No rash noted. He is not diaphoretic. No cyanosis. Nails show no clubbing.    ED Course  Procedures  (including critical care time)  Labs Reviewed  CBC - Abnormal; Notable for the following:    RDW 11.2 (*)     All other components within normal limits  COMPREHENSIVE METABOLIC PANEL - Abnormal; Notable for the following:    BUN 5 (*)     Total Bilirubin 0.2 (*)     All other components within normal limits  POCT I-STAT TROPONIN I   No results found.   No diagnosis found.  CT chest 10/29/2011 IMPRESSION:  1. Mild centrilobular emphysema and paracentral emphysema. Emphysematous changes are age-advanced with the 35 the patient.  2. Abnormal paraspinous soft tissue prominence centered at the T2 level. The adjacent upper thoracic spine vertebral bodies have erosions with sclerotic margins. Findings are nonspecific, but  potential considerations include a neurofibroma or schwannoma. Malignancy cannot be excluded. Further evaluation with thoracic spine MRI with and without contrast is suggested.  3. Small, nonspecific left pleural based nodule adjacent to the third rib anteriorly. Question if this could have a similar etiology as the paraspinous soft tissue density. 3. Gynecomastia.  4. Focal anterior right upper chest exophytic skin lesion. Suggest direct visualization.   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 No cord compression or intraspinal mass lesion. It is possible that due to the proximity of this lesion to the brachial plexus, there may be some tethering of the lower cervical roots or trunks related to involvement of this plexiform lesion. Findings discussed with EDP.   Date: 12/04/2011  Rate: 71  Rhythm: normal sinus rhythm and premature ventricular contractions (PVC)  QRS Axis: normal  Intervals: normal  ST/T Wave abnormalities: normal  Conduction Disutrbances:none  Narrative Interpretation: poor R wave progression in pericordial leads  Old EKG  Reviewed: changes noted    MDM  CP, SOB, neurofibroma  Pt care resumed by Dr. Alto Denver. Pt is stable in NAD.  F/u has been scheduled with PMD (family medicain tomorrow, 7/11) for specialist referrals as needed CT angio chest pending dispo.       Jaci Carrel, PA-C 12/05/11 [address] PA-C 01/02/12 [phone]

## 2011-12-06 ENCOUNTER — Encounter: Payer: Self-pay | Admitting: Family Medicine

## 2011-12-06 ENCOUNTER — Ambulatory Visit (INDEPENDENT_AMBULATORY_CARE_PROVIDER_SITE_OTHER): Payer: Self-pay | Admitting: Family Medicine

## 2011-12-06 VITALS — BP 141/90 | HR 68 | Temp 98.0°F | Ht 68.0 in | Wt 152.0 lb

## 2011-12-06 DIAGNOSIS — J449 Chronic obstructive pulmonary disease, unspecified: Secondary | ICD-10-CM

## 2011-12-06 DIAGNOSIS — Q8500 Neurofibromatosis, unspecified: Secondary | ICD-10-CM

## 2011-12-06 DIAGNOSIS — R0602 Shortness of breath: Secondary | ICD-10-CM | POA: Insufficient documentation

## 2011-12-06 DIAGNOSIS — R0789 Other chest pain: Secondary | ICD-10-CM

## 2011-12-06 DIAGNOSIS — Z87891 Personal history of nicotine dependence: Secondary | ICD-10-CM

## 2011-12-06 MED ORDER — ALBUTEROL SULFATE HFA 108 (90 BASE) MCG/ACT IN AERS: 2.0000 | INHALATION_SPRAY | Freq: Four times a day (QID) | RESPIRATORY_TRACT | Status: DC | PRN

## 2011-12-06 MED ORDER — TRAMADOL HCL 50 MG PO TABS: 50.0000 mg | ORAL_TABLET | Freq: Three times a day (TID) | ORAL | Status: DC | PRN

## 2011-12-06 NOTE — Patient Instructions (Addendum)
Dear James [name],   It was great to see you today. Thank you for coming to clinic. Please read below regarding the issues that we discussed.   1. I want you to try albuterol to see if it helps with your chest discomfort and shortness of breath given your COPD.  2. For your pain, you can try tramadol 3. I am going to review your records further including records from your previous doctor.   Please follow up in clinic in 2 weeks (after you have gotten your orange card) . Please call earlier if you have any questions or concerns.   Sincerely,  Dr. Tana Conch

## 2011-12-10 ENCOUNTER — Encounter: Payer: Self-pay | Admitting: Family Medicine

## 2011-12-10 DIAGNOSIS — Q8501 Neurofibromatosis, type 1: Secondary | ICD-10-CM | POA: Insufficient documentation

## 2011-12-10 DIAGNOSIS — F17210 Nicotine dependence, cigarettes, uncomplicated: Secondary | ICD-10-CM | POA: Insufficient documentation

## 2011-12-10 NOTE — Assessment & Plan Note (Signed)
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 Referrals are difficult with orange card only but will discuss with preceptor's most valuable resources for patient.

## 2011-12-10 NOTE — Assessment & Plan Note (Signed)
Trial of albuterol for SOB that is associated with CP. Reassuring that patient had a CTA without PE as well POC troponin one day ago. No recent weight loss.   COPD seems atypical at this age even given 15-20 pack years. Patient praised for his efforts to quit smoking. Will obtain alpha 1 antitrypsin and HIV at next visit due to these being causes of potential early emphysema.

## 2011-12-10 NOTE — Assessment & Plan Note (Signed)
Follow up each visit. Once again, praised quitting efforts.

## 2011-12-10 NOTE — Assessment & Plan Note (Signed)
Patient with a very tender subcutaneous likely neurofibroma. No fevers. Patient says vicodin did not help his pain. Willing to try tramadol. Also told patient to continue use of NSAIDs.   Obtaining records from PCP to see if they will help with workup of any of these problems.

## 2011-12-10 NOTE — Progress Notes (Signed)
Subjective:   Patient presents for new patient visit with primary concern of recurrent chest pain over 2-3 months. History obtained both from patient and through medical record  Patient describes chest pain for the last 3 months. He had it checked out the first time it occurred but states after that it was present but did not bother him as much. Over the last month, the pain has worsened and is concerning him again. Pain up to 8/10. No fevers, chills, weight loss. Quit smoking on Wednesday due to being told he had COPD. No alcohol since mid June. Patient does have some persistent feelings of SOB throughout the day over the last several weeks which worsens on exertion. His chest pain occurs approximately twice a day and lasts an hour up to 8/10 described as a sharp pain which is pleuritic. Can occur in any setting-resting, going up stairs, sleeping. Lasts for 1 hour. Pain best controlled by aspirin or NSAIDs. Patient says no relief with Vicodin.   09/04/11 ED-Presented for CP over 2 days worse with movement, deep breathing. POC troponin negative. EKG NSR no st/t wave changes. Normal vitals. Reproducible chest wall pain noted-sent home on antiinflammatories  10/29/11 ED-CP for 1-2 weeks constant, worsening 8/10 at worst. Assc. Intermittent left arm numbness. Recent diagnosis of bronchitis by PCP. Pt reported already having a trial of vicodin after prednisone and naproxen did not relieve pain. Discovered patient had a family history of neurofibromatosis.  Ct and MRI performed as below. Patient advised to f/u with PCP for referral to appropriate specialists.   CT showed-1. Mild centrilobular emphysema and paracentral emphysema.  2. Abnormal paraspinous soft tissue prominence centered at the T2 level. The adjacent upper thoracic spine vertebral bodies have erosions with sclerotic margins. Findings are nonspecific, but potential considerations include a neurofibroma or schwannoma. Malignancy cannot be excluded.  Further evaluation with thoracic spine MRI with and without contrast is suggested. 3. Small, nonspecific left pleural based nodule adjacent to the third rib anteriorly. Question if this could have a similar etiology as the paraspinous soft tissue density. 3.  Focal anterior right upper chest exophytic skin lesion. Suggest direct visualization.  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 No cord compression or intraspinal mass lesion. It is possible that due to the proximity of this lesion to the brachial plexus, there may be some tethering of the lower cervical roots or trunks related to involvement of this plexiform lesion.   11/11/11 ED- epigastric abdominal pain without radiation, vomited 25 cc of bright red blood. No melena or coffee ground. Heavy ETOH use patient admits to. Never experienced before. IVF, protonix rx, told to avoid alcohol, and given carafate.  Patient states that this pain has not reoccurred despite running out almost 2 weeks ago on meds. Patient reports no alcohol use since this visit.   12/05/11 ED-describes pain for 1 month. Sharp pleuritic 6/10. Reportedly DOE. Also SOB. Chest pain reproducible throughout sternum. CT angio without pulmonary embolism. Emphysematous changes noted.  No specific treatment nor diagnosis-deferred to PCP ans patient stable in NAD.   I have personally entered and updated the following information in epic: Past Medical History  Diagnosis Date  . COPD (chronic obstructive pulmonary disease)   . Bronchitis     11/2011  . Neurofibromatosis     diagnosed 2013    Patient Active Problem List  Diagnosis  . COPD (  chronic obstructive pulmonary disease)  . Chest pain, atypical   Medications: Vicodin 5-325 1 tablet by mouth q6  Allergies: NKDA  Family History  Problem Relation Age of Onset  . Diabetes Mother     and father  .  Hypertension Mother     and father  . Neurofibromatosis Brother     and 2 daughters    Social History  . Marital Status: Married    Number of Children: 2  . Years of Education: Some high school   Occupational History  . unemployed    Social History Main Topics  . Smoking status: Former Smoker -- 0.5 packs/day for 17 years    Types: Cigarettes    Quit date: 12/05/2011   Social History Narrative   Unemployed. Recent patient of Quitman Livings, MD of [facility] transferred to our practice after being in jail and losing insurance. Lives in his parents home wit his wife and 2 kids. Completed some High School. Best contact [phone] and ok to leave a message. Quit smoking early 7/13. Quit ETOH abuse early 203 after being told he had an upset stomach lining.     Objective:  BP 141/90  Pulse 68  Temp 98 F (36.7 C) (Oral)  Ht 5\' 8"  (1.727 m)  Wt 152 lb (68.947 kg)  BMI 23.11 kg/m2 Gen: NAD, appears stated age HEENT: MMM, PERRLA, jaundiced appearance to eyes (patient states baseline) Neck: supple, no lymphadenopathy, no JVD CV: RRR, no murmurs rubs or gallops Lungs: diminished breath sounds bilaterally and throughout. Occasional diffuse wheeze Abdomen: soft, nontender, nondistended Extremities: 2+ pulses, no edema Skin: multiple fleshy skin colored or more deeply pigmented nodules diffusely over skin most prominently on chest and back MSK: subcutaneous firm nodule along the sternum which is exquisitely tender to touch

## 2011-12-20 ENCOUNTER — Telehealth: Payer: Self-pay | Admitting: *Deleted

## 2011-12-20 ENCOUNTER — Telehealth: Payer: Self-pay | Admitting: Family Medicine

## 2011-12-20 NOTE — Telephone Encounter (Signed)
Advised that I did speak with MD and will plan on seeing him tomorrow AM

## 2011-12-20 NOTE — Telephone Encounter (Signed)
Patient calling back.  Call was drop when speaking with you a few minutes ago.

## 2011-12-20 NOTE — Telephone Encounter (Signed)
Patient calls reporting BP reading today with a finger BP monitor was 149/103 pulse 54. Denies any chest pain but states he feels a little weakness.  Appointment scheduled tomorrow for  appointment. Consulted with Dr. Sheffield Slider.

## 2011-12-21 ENCOUNTER — Ambulatory Visit (INDEPENDENT_AMBULATORY_CARE_PROVIDER_SITE_OTHER): Payer: Medicaid Other | Admitting: Family Medicine

## 2011-12-21 ENCOUNTER — Encounter: Payer: Self-pay | Admitting: Family Medicine

## 2011-12-21 VITALS — BP 141/93 | HR 69 | Temp 98.2°F | Ht 67.0 in | Wt 151.6 lb

## 2011-12-21 DIAGNOSIS — F17200 Nicotine dependence, unspecified, uncomplicated: Secondary | ICD-10-CM

## 2011-12-21 DIAGNOSIS — F17210 Nicotine dependence, cigarettes, uncomplicated: Secondary | ICD-10-CM

## 2011-12-21 DIAGNOSIS — I10 Essential (primary) hypertension: Secondary | ICD-10-CM

## 2011-12-21 HISTORY — DX: Essential (primary) hypertension: I10

## 2011-12-21 LAB — LIPID PANEL
HDL: 39 mg/dL — ABNORMAL LOW (ref >39)
LDL Cholesterol: 108 mg/dL — ABNORMAL HIGH (ref 0–99)
Total CHOL/HDL Ratio: 4.1 Ratio
Triglycerides: 69 mg/dL (ref <150)
VLDL: 14 mg/dL (ref 0–40)

## 2011-12-21 LAB — BASIC METABOLIC PANEL
BUN: 6 mg/dL (ref 6–23)
Creat: 0.87 mg/dL (ref 0.50–1.35)
Glucose, Bld: 79 mg/dL (ref 70–99)

## 2011-12-21 LAB — TSH: TSH: 0.52 u[IU]/mL (ref 0.350–4.500)

## 2011-12-21 MED ORDER — HYDROCHLOROTHIAZIDE 12.5 MG PO CAPS: 12.5000 mg | ORAL_CAPSULE | Freq: Every day | ORAL | Status: DC

## 2011-12-21 NOTE — Assessment & Plan Note (Addendum)
A: elevated BP x 1 month. Symptomatic. Risk factors include fam hx, smoking. P: -start HCTZ 12.5 mg -lifestyle modification per AVS -check fasting lipids, TSH and blood sugar today.  -goal LDL (2 known risk factors : smoking and HTN) < 160 if > 190 start statin -f/u with PCP in two weeks.

## 2011-12-21 NOTE — Progress Notes (Signed)
Subjective:     Patient ID: James [name], male   DOB: 1976/05/17, 36 y.o.   MRN: [id number]  HPI 36 yo M presents for same day visit with a complaint of elevated BP associated with weakness. He reports checking his BP at home and it was 149/103. He admits to associated dizziness, generalized weakness, headaches, chest pain and SOB. He denies LE edema. His SOB and chest pains are chronic. Chest pain is non-exertional and related to chronic L shoulder pain for which he take tramadol during the day and a percocet (prescribed by the ED) at night. He denies weakness, chest pain and SOB currently.  He is a smoker of 1 cigarette per day. His mother and father both have hypertension. He lives at home with mom, dad, 2 children (1 and 3) and mother of his children. He denies illicit drug use. He denies weight changes.   Review of Systems As per HPI   Objective:   Physical Exam BP 141/93  Pulse 69  Temp 98.2 F (36.8 C) (Oral)  Ht 5\' 7"  (1.702 m)  Wt 151 lb 9 oz (68.748 kg)  BMI 23.74 kg/m2 Wt Readings from Last 3 Encounters:  12/21/11 151 lb 9 oz (68.748 kg)  12/06/11 152 lb (68.947 kg)   BP Readings from Last 3 Encounters:  12/21/11 141/93  12/06/11 141/90  12/05/11 143/107   General appearance: alert, cooperative and no distress Head: Normocephalic, without obvious abnormality, atraumatic Eyes: conjunctivae/corneas clear. PERRL, EOM's intact. Fundi benign. Neck: no adenopathy, no carotid bruit, no JVD, supple, symmetrical, trachea midline and thyroid not enlarged, symmetric, no tenderness/mass/nodules Lungs: clear to auscultation bilaterally Heart: regular rate and rhythm, S1, S2 normal, no murmur, click, rub or gallop Skin: Skin color, texture, turgor normal. No rashes or lesions Neurologic: no CN deficits, no motor weakness, no sensory deficits, negative romberg.     Assessment and Plan:

## 2011-12-21 NOTE — Assessment & Plan Note (Signed)
A: current light smoker.  P: encouraged cessation as a part of BP control.

## 2011-12-21 NOTE — Patient Instructions (Addendum)
James [name],  Thank you for coming in today. I will call or send a letter with the results of your blood work.   Please start HCTZ 12.5 mg daily. Please limit take out to 2 per week at most, avoid canned foods, avoid salted snacks.  Increase intake of vegetables for the potassium.  Regular moderate exercise of 30 minutes 4 x per week.  F/u in 2 weeks with Dr. Durene Cal please change existing appointment.  Dr. Armen Pickup

## 2011-12-24 ENCOUNTER — Telehealth: Payer: Self-pay | Admitting: Family Medicine

## 2011-12-24 DIAGNOSIS — I10 Essential (primary) hypertension: Secondary | ICD-10-CM

## 2011-12-24 NOTE — Telephone Encounter (Signed)
Advised pt of test results.

## 2011-12-24 NOTE — Telephone Encounter (Signed)
Normal labs. LDL within goal of < 160. Please inform patient.

## 2011-12-27 ENCOUNTER — Encounter: Payer: Self-pay | Admitting: Family Medicine

## 2011-12-27 ENCOUNTER — Ambulatory Visit (INDEPENDENT_AMBULATORY_CARE_PROVIDER_SITE_OTHER): Payer: Medicaid Other | Admitting: Family Medicine

## 2011-12-27 VITALS — BP 109/75 | HR 86 | Ht 67.0 in | Wt 152.5 lb

## 2011-12-27 DIAGNOSIS — Q8500 Neurofibromatosis, unspecified: Secondary | ICD-10-CM

## 2011-12-27 DIAGNOSIS — Z87891 Personal history of nicotine dependence: Secondary | ICD-10-CM

## 2011-12-27 DIAGNOSIS — Z720 Tobacco use: Secondary | ICD-10-CM | POA: Insufficient documentation

## 2011-12-27 DIAGNOSIS — R0789 Other chest pain: Secondary | ICD-10-CM

## 2011-12-27 DIAGNOSIS — I10 Essential (primary) hypertension: Secondary | ICD-10-CM

## 2011-12-27 DIAGNOSIS — M25519 Pain in unspecified shoulder: Secondary | ICD-10-CM | POA: Insufficient documentation

## 2011-12-27 DIAGNOSIS — J449 Chronic obstructive pulmonary disease, unspecified: Secondary | ICD-10-CM

## 2011-12-27 MED ORDER — OXYCODONE-ACETAMINOPHEN 5-325 MG PO TABS: 1.0000 | ORAL_TABLET | Freq: Three times a day (TID) | ORAL | Status: AC | PRN

## 2011-12-27 MED ORDER — ALBUTEROL SULFATE HFA 108 (90 BASE) MCG/ACT IN AERS: 2.0000 | INHALATION_SPRAY | Freq: Four times a day (QID) | RESPIRATORY_TRACT | Status: DC | PRN

## 2011-12-27 NOTE — Assessment & Plan Note (Addendum)
Will have patient in for PFT testing. Refilled albuterol. Deferred salmeterol until PFTs.   Due to early onset, will check alpha 1 antitrypsin and HIV

## 2011-12-27 NOTE — Assessment & Plan Note (Signed)
Will refer to neurology for specific surveillance recs given NF. WOuld also appreciate input if they think a neurosurgery consult would be warranted for possible brachial plexus impingement related to scalloping lesion along spinal column.

## 2011-12-27 NOTE — Patient Instructions (Addendum)
Dear James [name],   It was great to see you today. Thank you for coming to clinic. Please read below regarding the issues that we discussed.   1. I want you to schedule an appointment with Dr. Raymondo Band for pulmonary function testing. Remember to NOT take your albuterol that morning.  2. I am going to get some labs today to look into your early COPD.  3. Congrats on quitting smoking! I am thrilled you made this decision 4. I am going to refer you to neurology for your neurofibromatosis and potentially we may involve neurosurgery at some point.   Please follow up within 6 months so we can see how things are going.   Sincerely,  Dr. Tana Conch  There is not actually a support group that meets in the area. I am sorry I told you otherwise at last appointment. A great resource is The [employer]. ([employer]veltyDoor[employer]no.) They have lots of info about NF. Visit the web site and browse around. For your kids, there are some camps for children. [employer] has a camp for kids with NF, and it starts at age 46. It's in [place], and there are some scholarships for it. There are also other camps at the [employer], including a Research scientist (life sciences) camp in [place]. These camps have themes, such as diabetes camp,cardiac camp, neurology camp, craniofacial anomaly camp. So, if his children have issues that fit in one of the camps that are offered, then they could go to one of those (I think they are free).

## 2011-12-27 NOTE — Assessment & Plan Note (Signed)
At goal on medication. WIll continue HCTZ

## 2011-12-27 NOTE — Assessment & Plan Note (Signed)
Refilled 15 percocet to be used sparingly for breakthrough pain. Would consider starting a medicine like gabapentin at next visit and attempt to transition off as goal is to not have chronic narcotics.

## 2011-12-27 NOTE — Assessment & Plan Note (Signed)
Praised patient's cessation efforts. Big steps for him over last year quitting smoking and drinking.

## 2011-12-27 NOTE — Assessment & Plan Note (Signed)
Percocet for prn breakthrough pain for shoulder and chest pain. [place] pain thought related to possible brachial plexus impingement from neurofibroma.

## 2011-12-27 NOTE — Progress Notes (Signed)
Subjective:   1. Chest Pain-improved from 7-8/10 to 3/10 with albuterol use but has been using it up to twice a day. SOB that he was formerly feeling is also resolved by the albuterol. The baseline chest pain does seem to continue to be MSK and it is worsened when he presses subcutaneous nodule on his chest. Patient did run out of percocet which he uses for breakthrough pain and requests small refill. Patient stopped taking tramadol as he says it does not work at all for him.   2. COPD-as above using albuterol twice daily and would like  A refill as he is about out. Patient is interested in controller medicine but we discussed getting PFTs to confirm diagnosis of COPD before advancing therapy.   3. Tobacco abuse-quit after last visit with Dr. Armen Pickup on the same day and has not smoked since.   4. Hypertension-compliant with HCTZ and without side effects.   ROS--See HPI  Past Medical History-smoking status noted: former 20 pack year smoker.  Reviewed problem list.  Medications- reviewed and updated Chief complaint-noted  Objective: BP 109/75  Pulse 86  Ht 5\' 7"  (1.702 m)  Wt 152 lb 8 oz (69.174 kg)  BMI 23.89 kg/m2  SpO2 98% Gen: NAD CV: RRR, no murmurs rubs or gallops Lungs: diminished breath sounds bilaterally and throughout. No wheeze auscultated although slight intermittent wheeze at last visit.  Abdomen: soft, nontender, nondistended Extremities: 2+ pulses, no edema Skin: multiple fleshy skin colored or more deeply pigmented nodules diffusely over skin most prominently on chest and back MSK: subcutaneous firm nodule along the sternum which is moderately tender to touch   Assessment/Plan: See problem oriented charted

## 2011-12-31 ENCOUNTER — Telehealth: Payer: Self-pay | Admitting: Family Medicine

## 2011-12-31 ENCOUNTER — Encounter: Payer: Self-pay | Admitting: Family Medicine

## 2011-12-31 NOTE — Telephone Encounter (Signed)
Pt just started taking albuterol inhaler - now has bruises inside his mouth - wants to speak with nurse

## 2011-12-31 NOTE — Telephone Encounter (Signed)
Patient states that he was started an an Albuterol inhaler 2 weeks ago and today noticed 2 dime sized bruises inside his mouth.  They are on the inside of each cheek.  They are not painful, no brusing noted anywhere else on his body.  Patient is not on any type of blood thinner, only takes HCTZ for HTN.  Patient also denies any recent injuries to his mouth.  Discussed situation with Dr. [place].  Felt that it would probably be best for him to come in so we could look at these bruises and maybe check a CBC and platelets.  Appointment made for tomorrow.

## 2012-01-01 ENCOUNTER — Encounter: Payer: Self-pay | Admitting: Family Medicine

## 2012-01-01 ENCOUNTER — Ambulatory Visit (INDEPENDENT_AMBULATORY_CARE_PROVIDER_SITE_OTHER): Payer: Medicaid Other | Admitting: Family Medicine

## 2012-01-01 ENCOUNTER — Ambulatory Visit: Payer: Medicaid Other | Admitting: Family Medicine

## 2012-01-01 VITALS — BP 111/77 | HR 88 | Ht 67.0 in | Wt 154.0 lb

## 2012-01-01 DIAGNOSIS — K1370 Unspecified lesions of oral mucosa: Secondary | ICD-10-CM | POA: Insufficient documentation

## 2012-01-01 DIAGNOSIS — T148XXA Other injury of unspecified body region, initial encounter: Secondary | ICD-10-CM

## 2012-01-01 LAB — CBC
HCT: 45.4 % (ref 39.0–52.0)
MCH: 30.6 pg (ref 26.0–34.0)
MCV: 87.3 fL (ref 78.0–100.0)
Platelets: 224 10*3/uL (ref 150–400)
RDW: 11.9 % (ref 11.5–15.5)

## 2012-01-01 NOTE — Progress Notes (Signed)
  Subjective:    Patient ID: James [name], male    DOB: [birth year], 36 y.o.   MRN: [id number]  HPI  1.  Hyperpigmented areas in mouth:  Patient has been using albuterol one inhaler puff every day or so, which is his baseline.. Since using that he states he has noticed two dark areas on the inside of his buccal mucosa bilaterally. He states that these are nonpainful. He has no idea how long they have actually been there but noticed the past week or so. He wanted this checked out.  No fevers or chills. No easy bleeding. No history of nosebleeds. Brushes his teeth regularly. He does have history of neurofibromatosis. He denies any other chronic medical issues.  Review of Systems See HPI above for review of systems.       Objective:   Physical Exam  Gen:  Alert, cooperative patient who appears stated age in no acute distress.  Vital signs reviewed. HEENT:  [place]/AT.  EOMI, PERRL.  Hyper-pigmented areas about 0.5 cm in diameter noted Bilateral buccal mucosa.  MMM, tonsils non-erythematous, non-edematous.  External ears WNL, Bilateral TM's normal without retraction, redness or bulging.  Septum intact, no bleeding noted, nasal turbinates not bleeding or bruised.   Cardiac:  Regular rate and rhythm without murmur auscultated.  Good S1/S2. Pulm:  Clear to auscultation bilaterally with good air movement.  No wheezes or rales noted.   Skin:  No nail or other skin discolorations or lesions noted          Assessment & Plan:

## 2012-01-01 NOTE — Assessment & Plan Note (Signed)
I tend to think that this is a chronic hyperpigmented area that he has just noticed with albuterol usage, especially as they are perfectly symmetrical and he has not other hyperpigmented areas noted throughout.   Patient admits that he doesn't know how long it has been there, states it could have been "forever."   No current or past bleeding episodes.   Will check platelets and liver function today.  CMET normal in July, would not expect major change in 1 month.   If area worsens or enlarges, may need biopsy for definitive diagnosis.

## 2012-01-02 LAB — COMPREHENSIVE METABOLIC PANEL
ALT: 17 U/L (ref 0–53)
AST: 19 U/L (ref 0–37)
BUN: 7 mg/dL (ref 6–23)
Calcium: 9.5 mg/dL (ref 8.4–10.5)
Chloride: 103 mEq/L (ref 96–112)
Creat: 0.84 mg/dL (ref 0.50–1.35)
Total Bilirubin: 0.4 mg/dL (ref 0.3–1.2)

## 2012-01-02 NOTE — ED Provider Notes (Signed)
Medical screening examination/treatment/procedure(s) were conducted as a shared visit with non-physician practitioner(s) and myself.  I personally evaluated the patient during the encounter  Cyndra Numbers, MD 01/02/12 1430

## 2012-01-08 ENCOUNTER — Ambulatory Visit (INDEPENDENT_AMBULATORY_CARE_PROVIDER_SITE_OTHER): Payer: Medicaid Other | Admitting: Pharmacist

## 2012-01-08 ENCOUNTER — Encounter: Payer: Self-pay | Admitting: Pharmacist

## 2012-01-08 VITALS — BP 129/82 | HR 77 | Ht 67.0 in | Wt 154.0 lb

## 2012-01-08 DIAGNOSIS — J449 Chronic obstructive pulmonary disease, unspecified: Secondary | ICD-10-CM

## 2012-01-08 DIAGNOSIS — Z87891 Personal history of nicotine dependence: Secondary | ICD-10-CM

## 2012-01-08 NOTE — Progress Notes (Signed)
  Subjective:    Patient ID: James [name], male    DOB: 07-Dec-1975, 36 y.o.   MRN: [id number]  HPI Patient arrives in good spirits.   Patient reports use of albuterol for the last several months (2013) - NOT previous to 2013.  He states he has been using albuterol 2 puffs twice daily routinely.   He last used albuterol yesterday morning at 10 AM - > 24 hours since last dose.   He expresses concern for "COPD"  He has smoked for 16 of the last 17 years (quit in 2009 while incarcerated).   Smoked [place] 0.5 ppd.    Review of Systems     Objective:   Physical Exam  See Documentation Flowsheet (discrete results - PFTs) for complete Spirometry results. Patient provided good effort while attempting spirometry.    Peak Flow Meter provided In office PEAK flow on meter provided was 600.     Assessment & Plan:  Spirometry evaluation with Pre Bronchodilator reveals normal lung function.  Patient has been experiencing Shortness of Breath for the last few months and taking Albuterol 2 puffs twice daily which seems to help.  Patient willing to utilize Peak Flow meter at home to evaluate breathing when he experiences shortness of breath.   His FVC 141% predicted and FEV1 of 133% predicted are significantly better than average for age and height.   Continue current treatment plan at this time.  Reviewed results of pulmonary function tests.  Pt verbalized understanding of results.  Written pt instructions provided.  F/U Clinic visit with Dr Durene Cal with results of Peak Flow meter in the next 1-2 months.  Total time in face to face counseling 35 minutes.  Patient seen with Bernadene Person, PharmD Candidate and Drue Stager, PharmD, Pharmacy Resident. . History of 16 years of smoking.  Quit for ~ 1 month.  Encouraged continued abstinence from smoking.  Patient appears to be a good candidate for long term smoking cessation at this time.

## 2012-01-08 NOTE — Assessment & Plan Note (Signed)
Spirometry evaluation with Pre Bronchodilator reveals normal lung function.  Patient has been experiencing Shortness of Breath for the last few months and taking Albuterol 2 puffs twice daily which seems to help.  Patient willing to utilize Peak Flow meter at home to evaluate breathing when he experiences shortness of breath.   His FVC 141% predicted and FEV1 of 133% predicted are significantly better than average for age and height.   Continue current treatment plan at this time.  Reviewed results of pulmonary function tests.  Pt verbalized understanding of results.  Written pt instructions provided.  F/U Clinic visit with Dr Durene Cal with results of Peak Flow meter in the next 1-2 months.  Total time in face to face counseling 35 minutes.  Patient seen with Bernadene Person, PharmD Candidate and Drue Stager, PharmD, Pharmacy Resident. . History of 16 years of smoking.  Quit for ~ 1 month.  Encouraged continued abstinence from smoking.  Patient appears to be a good candidate for long term smoking cessation at this time.

## 2012-01-08 NOTE — Assessment & Plan Note (Addendum)
  History of 16 years of smoking.  Quit for ~ 1 month.  Encouraged continued abstinence from smoking.  Patient appears to be a good candidate for long term smoking cessation at this time.

## 2012-01-08 NOTE — Patient Instructions (Addendum)
It was good to see you today.  Your spirometry was normal.  Use your peak flow meter to check your breathing most days, and especially if you are feeling short of breath.  Follow up in 1-2 months with Dr. Durene Cal.

## 2012-01-08 NOTE — Progress Notes (Signed)
Patient ID: James [name], male   DOB: [birth year], 36 y.o.   MRN: [id number] Reviewed and agree with Dr. Macky Lower documentation and management.

## 2012-01-08 NOTE — Progress Notes (Signed)
Patient ID: James [name], male   DOB: 01-13-1976, 36 y.o.   MRN: [id number] Reviewed and agree with Dr. Macky Lower management and documentation.

## 2012-01-09 ENCOUNTER — Encounter: Payer: Self-pay | Admitting: Family Medicine

## 2012-01-09 ENCOUNTER — Telehealth: Payer: Self-pay | Admitting: Family Medicine

## 2012-01-09 ENCOUNTER — Ambulatory Visit (INDEPENDENT_AMBULATORY_CARE_PROVIDER_SITE_OTHER): Payer: Medicaid Other | Admitting: Family Medicine

## 2012-01-09 VITALS — BP 122/75 | HR 77 | Temp 97.9°F | Ht 67.0 in | Wt 153.9 lb

## 2012-01-09 DIAGNOSIS — R042 Hemoptysis: Secondary | ICD-10-CM

## 2012-01-09 DIAGNOSIS — Z111 Encounter for screening for respiratory tuberculosis: Secondary | ICD-10-CM

## 2012-01-09 NOTE — Addendum Note (Signed)
Addended by: Majel Homer D on: 01/09/2012 05:33 PM   Modules accepted: Orders

## 2012-01-09 NOTE — Telephone Encounter (Signed)
Patient states  He has not had any cold symptoms. Just all of a sudden this AM started  expectorating dark sputum, now lighter. States sputum was streaked with blood. No fever. Appointment scheduled  for work in

## 2012-01-09 NOTE — Telephone Encounter (Signed)
Pt called to say that he has been spitting up blood since this morning - wants to speak to nurse

## 2012-01-09 NOTE — Progress Notes (Signed)
Patient ID: MAKAR SLATTER, male   DOB: 04/29/1976, 36 y.o.   MRN: [id number] Subjective: The patient is a 36 y.o. year old male who presents today for hemoptysis.  1. Hemoptysis: Began this morning.  Just with coughing.  Has been coughing for last 2-3 days.  Former smoker, last time several weeks ago.  No fevers/chills.  This is a new complaint.  Has never had this problem before.  He does have neurofibromatosis, which does complicate he medical care.  He reports no other symptoms, no nose-bleeds, no weight loss, good appetite, no fevers/chills.  Patient's past medical, social, and family history were reviewed and updated as appropriate. History  Substance Use Topics  . Smoking status: Former Smoker -- 0.5 packs/day for 17 years    Types: Cigarettes    Quit date: 12/05/2011  . Smokeless tobacco: Not on file  . Alcohol Use: No   Objective:  Filed Vitals:   01/09/12 1640  BP: 122/75  Pulse: 77  Temp: 97.9 F (36.6 C)   Gen: NAD, well developed HEENT: MMM, posterior pharynx demonstrates slight cobblestoning, no adenopathy Resp: CTABL  Assessment/Plan:  Please also see individual problems in problem list for problem-specific plans.

## 2012-01-09 NOTE — Assessment & Plan Note (Signed)
Hemoptysis in a former smoker with neurofibromatosis.  Patient had unremarkable chest CT about 1 month ago.  Differential would include neoplasm, AVM, neurofibroma in the nasal cavity, sinusitis, or pharyngeal irritation from post-nasal drip.  Given comorbidities will refer to ENT for evaluation and possible scope.  PPD placed today to rule out TB as cause, although I feel this is very unlikely.

## 2012-01-11 ENCOUNTER — Ambulatory Visit (INDEPENDENT_AMBULATORY_CARE_PROVIDER_SITE_OTHER): Payer: Medicaid Other | Admitting: *Deleted

## 2012-01-11 DIAGNOSIS — Z111 Encounter for screening for respiratory tuberculosis: Secondary | ICD-10-CM

## 2012-01-11 DIAGNOSIS — IMO0001 Reserved for inherently not codable concepts without codable children: Secondary | ICD-10-CM

## 2012-01-11 LAB — TB SKIN TEST: TB Skin Test: NEGATIVE

## 2012-01-21 ENCOUNTER — Telehealth: Payer: Self-pay | Admitting: *Deleted

## 2012-01-21 NOTE — Telephone Encounter (Signed)
Due to patient having Medicaid, office calling to request NPI #  to authorize appt.  NPI # given.  Melaina Howerton Ann, RN   

## 2012-01-24 ENCOUNTER — Other Ambulatory Visit (HOSPITAL_COMMUNITY): Payer: Self-pay | Admitting: Diagnostic Neuroimaging

## 2012-01-24 DIAGNOSIS — Q8501 Neurofibromatosis, type 1: Secondary | ICD-10-CM

## 2012-01-27 ENCOUNTER — Encounter: Payer: Self-pay | Admitting: Family Medicine

## 2012-01-29 ENCOUNTER — Other Ambulatory Visit: Payer: Self-pay | Admitting: Family Medicine

## 2012-01-29 NOTE — Telephone Encounter (Signed)
Refilled albuterol 

## 2012-02-02 ENCOUNTER — Emergency Department (HOSPITAL_COMMUNITY)
Admission: EM | Admit: 2012-02-02 | Discharge: 2012-02-03 | Disposition: A | Discharge status: Home / Self Care | Payer: Medicaid Other | Attending: Emergency Medicine | Admitting: Emergency Medicine

## 2012-02-02 ENCOUNTER — Other Ambulatory Visit: Payer: Self-pay

## 2012-02-02 ENCOUNTER — Encounter (HOSPITAL_COMMUNITY): Payer: Self-pay

## 2012-02-02 DIAGNOSIS — I10 Essential (primary) hypertension: Secondary | ICD-10-CM | POA: Insufficient documentation

## 2012-02-02 DIAGNOSIS — Z87891 Personal history of nicotine dependence: Secondary | ICD-10-CM | POA: Insufficient documentation

## 2012-02-02 DIAGNOSIS — K219 Gastro-esophageal reflux disease without esophagitis: Secondary | ICD-10-CM

## 2012-02-02 DIAGNOSIS — J449 Chronic obstructive pulmonary disease, unspecified: Secondary | ICD-10-CM | POA: Insufficient documentation

## 2012-02-02 DIAGNOSIS — Q8500 Neurofibromatosis, unspecified: Secondary | ICD-10-CM | POA: Insufficient documentation

## 2012-02-02 DIAGNOSIS — J4489 Other specified chronic obstructive pulmonary disease: Secondary | ICD-10-CM | POA: Insufficient documentation

## 2012-02-02 LAB — COMPREHENSIVE METABOLIC PANEL
ALT: 14 U/L (ref 0–53)
Alkaline Phosphatase: 97 U/L (ref 39–117)
BUN: 11 mg/dL (ref 6–23)
CO2: 26 mEq/L (ref 19–32)
GFR calc Af Amer: >90 mL/min (ref >90)
GFR calc non Af Amer: >90 mL/min (ref >90)
Glucose, Bld: 81 mg/dL (ref 70–99)
Potassium: 3.6 mEq/L (ref 3.5–5.1)
Sodium: 134 mEq/L — ABNORMAL LOW (ref 135–145)
Total Bilirubin: 0.3 mg/dL (ref 0.3–1.2)

## 2012-02-02 LAB — CBC WITH DIFFERENTIAL/PLATELET
Hemoglobin: 15.7 g/dL (ref 13.0–17.0)
Lymphocytes Relative: 24 % (ref 12–46)
Lymphs Abs: 2.4 10*3/uL (ref 0.7–4.0)
MCH: 30.3 pg (ref 26.0–34.0)
MCV: 86.9 fL (ref 78.0–100.0)
Monocytes Relative: 7 % (ref 3–12)
Neutrophils Relative %: 63 % (ref 43–77)
Platelets: 269 10*3/uL (ref 150–400)
RBC: 5.18 MIL/uL (ref 4.22–5.81)
WBC: 10.2 10*3/uL (ref 4.0–10.5)

## 2012-02-02 LAB — POCT I-STAT TROPONIN I: Troponin i, poc: 0 ng/mL (ref 0.00–0.08)

## 2012-02-02 MED ORDER — ONDANSETRON HCL 4 MG/2ML IJ SOLN
INTRAMUSCULAR | Status: AC
  Filled 2012-02-02: qty 2

## 2012-02-02 MED ORDER — ONDANSETRON HCL 4 MG/2ML IJ SOLN
4.0000 mg | Freq: Once | INTRAMUSCULAR | Status: AC
  Administered 2012-02-02: 4 mg via INTRAVENOUS

## 2012-02-02 MED ORDER — SODIUM CHLORIDE 0.9 % IV BOLUS (SEPSIS)
1000.0000 mL | Freq: Once | INTRAVENOUS | Status: AC
  Administered 2012-02-02: 1000 mL via INTRAVENOUS

## 2012-02-02 MED ORDER — KETOROLAC TROMETHAMINE 30 MG/ML IJ SOLN
30.0000 mg | Freq: Once | INTRAMUSCULAR | Status: AC
  Administered 2012-02-02: 30 mg via INTRAVENOUS
  Filled 2012-02-02: qty 1

## 2012-02-02 MED ORDER — GI COCKTAIL ~~LOC~~
30.0000 mL | Freq: Once | ORAL | Status: AC
  Administered 2012-02-02: 30 mL via ORAL
  Filled 2012-02-02: qty 30

## 2012-02-02 NOTE — ED Notes (Signed)
Pt here for chest pain on left side, and vomiting and lightheaded, onset at 1030 this morning, sts vomited 3 times today. Pt with hx of neurofiborosis, htn.

## 2012-02-03 ENCOUNTER — Emergency Department (HOSPITAL_COMMUNITY): Payer: Medicaid Other

## 2012-02-03 LAB — URINALYSIS, ROUTINE W REFLEX MICROSCOPIC
Bilirubin Urine: NEGATIVE
Glucose, UA: NEGATIVE mg/dL
Hgb urine dipstick: NEGATIVE
Ketones, ur: 40 mg/dL — AB
Leukocytes, UA: NEGATIVE
pH: 6 (ref 5.0–8.0)

## 2012-02-03 MED ORDER — TRAMADOL HCL 50 MG PO TABS: 50.0000 mg | ORAL_TABLET | Freq: Four times a day (QID) | ORAL | Status: AC | PRN

## 2012-02-03 MED ORDER — ONDANSETRON 8 MG PO TBDP: ORAL_TABLET | ORAL | Status: AC

## 2012-02-03 MED ORDER — OMEPRAZOLE 20 MG PO CPDR: 20.0000 mg | DELAYED_RELEASE_CAPSULE | Freq: Every day | ORAL | Status: DC

## 2012-02-03 NOTE — ED Notes (Signed)
PT ambulated with baseline gait; VSS; A&Ox3; no signs of distress; respirations even and unlabored; skin warm and dry; no questions upon discharge.  

## 2012-02-03 NOTE — ED Provider Notes (Signed)
History     CSN: [id number]  Arrival date & time 02/02/12  1630   First MD Initiated Contact with Patient 02/02/12 2301      Chief Complaint  Patient presents with  . Emesis    (Consider location/radiation/quality/duration/timing/severity/associated sxs/prior treatment) Patient is a 36 y.o. male presenting with vomiting. The history is provided by the patient. No language interpreter was used.  Emesis  This is a recurrent problem. The current episode started yesterday. The problem occurs 2 to 4 times per day. The problem has been resolved. The emesis has an appearance of stomach contents. There has been no fever. Pertinent negatives include no arthralgias, no chills, no cough, no diarrhea, no fever, no headaches, no myalgias, no sweats and no URI. Risk factors: none.  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  No DOE, no SOB.  No long car trips or plane trips no swelling in the lower extremities.    Past Medical History  Diagnosis Date  . COPD (chronic obstructive pulmonary disease)   . Bronchitis     11/2011  . Neurofibromatosis     diagnosed 2013  . Hypertension goal BP (blood pressure) < 140/90 12/21/2011    Past Surgical History  Procedure Date  . None     Family History  Problem Relation Age of Onset  . Diabetes Mother     and father  . Hypertension Mother     and father  . Neurofibromatosis Brother     and 2 daughters    History  Substance Use Topics  . Smoking status: Former Smoker -- 0.5 packs/day for 17 years    Types: Cigarettes    Quit date: 12/05/2011  . Smokeless tobacco: Not on file  . Alcohol Use: No      Review of Systems  Constitutional: Negative for fever and chills.  Respiratory: Negative for cough and shortness of breath.   Cardiovascular: Negative for palpitations and leg swelling.  Gastrointestinal: Positive for vomiting. Negative for diarrhea.  Musculoskeletal: Negative for  myalgias and arthralgias.  Neurological: Negative for headaches.  All other systems reviewed and are negative.    Allergies  Review of patient's allergies indicates no known allergies.  Home Medications   Current Outpatient Rx  Name Route Sig Dispense Refill  . ALBUTEROL SULFATE HFA 108 (90 BASE) MCG/ACT IN AERS Inhalation Inhale 2 puffs into the lungs every 6 (six) hours as needed. For shortness of breath/wheezing    . HYDROCHLOROTHIAZIDE 12.5 MG PO CAPS Oral Take 1 capsule (12.5 mg total) by mouth daily. 30 capsule 1    BP 141/72  Pulse 65  Temp 98.5 F (36.9 C) (Oral)  Resp 22  SpO2 95%  Physical Exam  Constitutional: He is oriented to person, place, and time. He appears well-developed and well-nourished.  HENT:  Head: Normocephalic and atraumatic.  Mouth/Throat: Oropharynx is clear and moist.  Eyes: Conjunctivae and EOM are normal. Pupils are equal, round, and reactive to light.  Neck: Normal range of motion. Neck supple.  Cardiovascular: Normal rate, regular rhythm and intact distal pulses.   Pulmonary/Chest: Effort normal and breath sounds normal. He has no wheezes. He has no rales.  Abdominal: Soft. Bowel sounds are normal. There is no tenderness. There is no rebound and no guarding.  Musculoskeletal: Normal range of motion.  Neurological: He is alert and oriented to person, place, and time.  Skin: Skin is warm and dry.  ED Course  Procedures (including critical care time)  Labs Reviewed  COMPREHENSIVE METABOLIC PANEL - Abnormal; Notable for the following:    Sodium 134 (*)     All other components within normal limits  URINALYSIS, ROUTINE W REFLEX MICROSCOPIC - Abnormal; Notable for the following:    Ketones, ur 40 (*)     All other components within normal limits  CBC WITH DIFFERENTIAL  POCT I-STAT TROPONIN I  LIPASE, BLOOD   Dg Chest 2 View  02/03/2012  *RADIOLOGY REPORT*  Clinical Data: Left-sided chest pain. COPD.  Vomiting. Neurofibromatosis.   CHEST - 2 VIEW  Comparison: 12/05/2011  Findings: Pulmonary hyperinflation again seen, consistent with COPD.  Both lungs are clear.  No evidence of pleural effusion or pneumothorax.  Heart size is normal.  No mass or lymphadenopathy identified.  IMPRESSION: COPD.  No active disease.   Original Report Authenticated By: Danae Orleans, M.D.      No diagnosis found.    MDM  PERC negative Wells 0 symptoms have been going on for years.  No suspicion for cardiac ischemia.  Negative troponin and negative EKG.  Suspect MSK pain and GERD.  Will treat.  Follow up with your family doctor for ongoing care   Date: 02/03/2012  Rate: 85  Rhythm: normal sinus rhythm  QRS Axis: normal  Intervals: normal  ST/T Wave abnormalities: normal  Conduction Disutrbances:none  Narrative Interpretation: LVH  Old EKG Reviewed: changes noted          Kamrin Spath Smitty Cords, MD 02/03/12 [phone]

## 2012-02-07 ENCOUNTER — Other Ambulatory Visit: Payer: Self-pay | Admitting: Family Medicine

## 2012-02-25 ENCOUNTER — Ambulatory Visit (INDEPENDENT_AMBULATORY_CARE_PROVIDER_SITE_OTHER): Payer: Medicaid Other | Admitting: Family Medicine

## 2012-02-25 ENCOUNTER — Encounter: Payer: Self-pay | Admitting: Family Medicine

## 2012-02-25 VITALS — BP 128/75 | HR 81 | Temp 97.8°F | Ht 67.0 in | Wt 153.8 lb

## 2012-02-25 DIAGNOSIS — R0602 Shortness of breath: Secondary | ICD-10-CM

## 2012-02-25 DIAGNOSIS — I10 Essential (primary) hypertension: Secondary | ICD-10-CM

## 2012-02-25 DIAGNOSIS — Z23 Encounter for immunization: Secondary | ICD-10-CM

## 2012-02-25 DIAGNOSIS — F17200 Nicotine dependence, unspecified, uncomplicated: Secondary | ICD-10-CM

## 2012-02-25 DIAGNOSIS — Z720 Tobacco use: Secondary | ICD-10-CM

## 2012-02-25 NOTE — Patient Instructions (Signed)
All of your lab work looked ok.  It does not look like you have COPD, but you will develop it if you continue to smoke. Great job on quitting. There will be some bumps in the road but let us know if we can help. 1-800-quit-now is a [employer] Officer.  For the possible reflux, you can try the Prilosec you were prescribed.

## 2012-02-26 NOTE — Assessment & Plan Note (Signed)
Well-controlled. Continue HCTZ

## 2012-02-26 NOTE — Assessment & Plan Note (Signed)
Recurrence of smoking x1 cigarette. Encouraged cessation. Patient at high risk for resuming given this failure. Will continue to follow up.

## 2012-02-26 NOTE — Progress Notes (Signed)
Subjective:   1. GERD-patient diagnosed with possible GERD as cause of chest pain. He reports trying omeprazole in past and not tolerating. He has been given a script for this. Encouraged patient to try medication to see if it will ease pain.   2. TObacco abuse-patient had 1 cigarette yesterday after being cigarette free for over a month  3. COPD-patient reports albuterol still helping chest pain and SOB when they occur. Discussed with patient normal PFTs and that despite hyperinflated lungs, COPD not most likely diagnosis and that we would resolve from problem list.   4.  Hypertension- BP Readings from Last 3 Encounters:  02/25/12 128/75  02/03/12 117/65  01/09/12 122/75   Home BP monitoring-no Compliant with medications-yes without side effects Denies any changes in CP or SOB. Denies blurry vision, LE edema, transient weakness, orthopnea, PND.        ROS--See HPI  Past Medical History-smoking status noted: tobacco abuse-had quit for over a month but had cigarette yesterday.  Reviewed problem list.  Medications- reviewed and updated Chief complaint-noted  Objective: BP 128/75  Pulse 81  Temp 97.8 F (36.6 C) (Oral)  Ht 5\' 7"  (1.702 m)  Wt 153 lb 12.8 oz (69.763 kg)  BMI 24.09 kg/m2  SpO2 94% Gen: NAD CV: RRR no mrg Lungs: CTAB MSK: tenderness to palpation with severe sensitivity of neurofibroma to right of sternum.  Skin: multiple neurofibroma noted.   Assessment/Plan: See problem oriented charted

## 2012-02-26 NOTE — Assessment & Plan Note (Addendum)
Have encouraged d/c of albuterol as not COPD given normal PFTs. Told patient COPD could develop with smoking so encouraged cessation.   Would consider referral to pulmonology at next visit.

## 2012-02-27 ENCOUNTER — Other Ambulatory Visit: Payer: Self-pay | Admitting: Family Medicine

## 2012-03-17 ENCOUNTER — Ambulatory Visit (INDEPENDENT_AMBULATORY_CARE_PROVIDER_SITE_OTHER): Payer: Medicaid Other | Admitting: Family Medicine

## 2012-03-17 ENCOUNTER — Encounter: Payer: Self-pay | Admitting: Family Medicine

## 2012-03-17 VITALS — BP 135/91 | HR 64 | Temp 98.0°F | Ht 67.0 in | Wt 155.0 lb

## 2012-03-17 DIAGNOSIS — M549 Dorsalgia, unspecified: Secondary | ICD-10-CM

## 2012-03-17 MED ORDER — ACETAMINOPHEN-CODEINE #3 300-30 MG PO TABS: 1.0000 | ORAL_TABLET | Freq: Four times a day (QID) | ORAL | Status: DC | PRN

## 2012-03-17 NOTE — Progress Notes (Signed)
  Subjective:    Patient ID: James [name], male    DOB: [birth year], 36 y.o.   MRN: [id number]  HPI  1.  Back pain:  Thoracic back pain over the weekend, described as sharp shooting pain that radiates to Left arm.  Has had trouble with back pain before as well.  Concerned this is secondary to neurofibromatosis.  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  No numbness or tingling, loss of strength, dropping things with either hand or arm.    Has been taking Tramadol without relief of his pain.  No fevers or chills.  No bladder or bowel incontinence.    Review of Systems See HPI above for review of systems.       Objective:   Physical Exam Gen:  Alert, cooperative patient who appears stated age in no acute distress.  Vital signs reviewed. Back:  Normal skin, Spine with normal alignment and no deformity.  No tenderness to vertebral process palpation.  Paraspinous muscles are tender bilateral thoracic region T5 - T8 but without spasm.   Range of motion is full at neck and lumbar sacral regions.   Neuro:  Sensation and motor function 5/5 bilateral upper and lower extremities.  Patellar and Achilles  DTR's +2 patellar BL Skin:  Multiple nodules consistent with neurofibromas noted scattered throughout skin.           Assessment & Plan:

## 2012-03-17 NOTE — Assessment & Plan Note (Signed)
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   He would likely benefit from neurologist referral in future now that he has Medicaid, will defer to PCP. Tylenol w/ codeine for relief.  Recommended to follow up with PCP next week, appointment made.   No red flags by history or physical today. I provided the patient with explicit warnings and red flags that would prompt return to clinic or the ED.

## 2012-03-17 NOTE — Patient Instructions (Addendum)
Come back in about 2 weeks so we can see how you're doing.  Fri November 1 Dr. Durene Cal has an opening.  Ask the ladies up front to schedule you at that time.

## 2012-03-28 ENCOUNTER — Ambulatory Visit (INDEPENDENT_AMBULATORY_CARE_PROVIDER_SITE_OTHER): Payer: Medicaid Other | Admitting: Family Medicine

## 2012-03-28 ENCOUNTER — Encounter: Payer: Self-pay | Admitting: Family Medicine

## 2012-03-28 VITALS — BP 128/79 | HR 93 | Temp 98.1°F | Ht 67.0 in | Wt 159.9 lb

## 2012-03-28 DIAGNOSIS — Z720 Tobacco use: Secondary | ICD-10-CM

## 2012-03-28 DIAGNOSIS — F17200 Nicotine dependence, unspecified, uncomplicated: Secondary | ICD-10-CM

## 2012-03-28 DIAGNOSIS — M549 Dorsalgia, unspecified: Secondary | ICD-10-CM

## 2012-03-28 DIAGNOSIS — R042 Hemoptysis: Secondary | ICD-10-CM

## 2012-03-28 NOTE — Assessment & Plan Note (Addendum)
Normal ENT eval. Omnaris didn't help. No active signs of bleeding. PPD negative. Have given patient list of dentists who accept medicaid. Oral likely source given no melena/BRBPR. Would consider GI referral if develops these. No anemia noted in September on CBC.   Talked about UDS with patient who denies drug use but will consider at future visit.

## 2012-03-28 NOTE — Assessment & Plan Note (Addendum)
Patient had already been referred to neurology on 8/1. Has follow up MRI scheduled through neurology. Attempting to obtain records. Depending on recommendations, will strongly consider neurosurgery referral. Possible pain related to painful neurofibroma on neck as pain is reproced when pressed on but given large neurofibroma adjacent to vertebrae and apparent affecting brachial plexus as well as new numbness/tingling in hands, think patient may potentially need surgery.   Concerning pain control, patient to bring all medications to next visit. He thinks neurology may have started gabapentin which I was going to start today so will await records.

## 2012-03-28 NOTE — Assessment & Plan Note (Signed)
Praised on cessation, will change to former smoker if cigarette free for 6 months.

## 2012-03-28 NOTE — Progress Notes (Signed)
Subjective:   1. Back pain follow up-patient seen in office a little over a week ago by Dr. Gwendolyn Grant for left upper back pain radiating to left arm. Seemed to be centered around a very tender neurofibroma on his neck. Started tylenol #3 instead of tramadol with some improvement in pain from 7/10 to 5/10. Worse with pressing on lesion but will flare at other random times throughout the day. Stopping him from playing with his kids at times. Patient still concerned about neurofibroma adjacent to spine. Has seen neurology after referral on 8/1 but we do not have records back yet. Patient does say he will have occasional tingling in fingers if pain flares. No loss of strength, dropping of objects, fever, chills, nausea. Does say he will occasionally have some pin prick feelings in bilateral heels. No bladder or bowel incontinence.   2. Blood in mouth-has been seen by ENT with normal nasopharyngolaryngoscopy in mid August. Advised omnaris spray BID which patient states has not helped. Formerly hemoptysis, now just blood pooling in his mouth in AM. Not sure if he bites his mouth overnight. Never happens in day time. Does have history of cavities and one currently painful tooth. Has not seen a dentist in year.   3. Tobacco abuse-has not smoked since last visit with me.   ROS--See HPI  Past Medical History-smoking status noted: former smoekr.  Reviewed problem list.  Medications- reviewed and updated Chief complaint-noted  Objective: BP 128/79  Pulse 93  Temp 98.1 F (36.7 C) (Oral)  Ht 5\' 7"  (1.702 m)  Wt 159 lb 14.4 oz (72.53 kg)  BMI 25.04 kg/m2 Gen: NAD CV: RRR no mrg Neck: tender subcutaneous neurofibroma near base of anterior portion of sternocleidomastoid.  HEENT: pharynx nonerythematous, no areas of scarring or irritation. Caries noted in left upper teeth. No signs of bleeding or potential sources of bleeding noted Lungs: CTAB Neuro:  CN II-XII intact, sensation and reflexes normal  throughout, 5/5 muscle strength in bilateral upper and lower extremities. Normal finger to nose. Normal rapid alternating movements.   Back -  Spine with normal alignment and no deformity.  No tenderness to vertebral process palpation.  Paraspinous muscles are not tender and without spasm.   Range of motion is full at neck and lumbar sacral regions Skin: diffuse neurofibromas  Assessment/Plan: See problem oriented charted

## 2012-03-28 NOTE — Patient Instructions (Signed)
For your back pain, I need to get records from the neurologist. We will work on that and if there is something that helps guide your therapy, we may be able to stop there, but we may need to refer you to neurosugery.   For the bleeding in your mouth, you have been evaluated by Ear nose and throat doctors and we could not find a source. I think your best bet would be to see a dentist. If you develop blood in your stool or dark tarry stool, let us know as we may need to send you to the stomach doctor.   GREAT JOB ON QUITTING SMOKING!!!!!!!!!!!!!!!!!!!!!!!!!!! Dr. Durene Cal

## 2012-04-11 ENCOUNTER — Other Ambulatory Visit: Payer: Self-pay | Admitting: Family Medicine

## 2012-04-15 ENCOUNTER — Other Ambulatory Visit: Payer: Self-pay | Admitting: Family Medicine

## 2012-04-15 NOTE — Telephone Encounter (Signed)
Pt is needing refill on his Tylenol #3 - he says he only has 2-3 left...   [facility] [address]

## 2012-04-16 ENCOUNTER — Emergency Department (HOSPITAL_COMMUNITY)
Admission: EM | Admit: 2012-04-16 | Discharge: 2012-04-16 | Disposition: A | Discharge status: Home / Self Care | Payer: Medicaid Other | Attending: Emergency Medicine | Admitting: Emergency Medicine

## 2012-04-16 ENCOUNTER — Encounter (HOSPITAL_COMMUNITY): Payer: Self-pay | Admitting: *Deleted

## 2012-04-16 DIAGNOSIS — J189 Pneumonia, unspecified organism: Secondary | ICD-10-CM | POA: Insufficient documentation

## 2012-04-16 DIAGNOSIS — I10 Essential (primary) hypertension: Secondary | ICD-10-CM | POA: Insufficient documentation

## 2012-04-16 DIAGNOSIS — Z87891 Personal history of nicotine dependence: Secondary | ICD-10-CM | POA: Insufficient documentation

## 2012-04-16 DIAGNOSIS — Z79899 Other long term (current) drug therapy: Secondary | ICD-10-CM | POA: Insufficient documentation

## 2012-04-16 DIAGNOSIS — L509 Urticaria, unspecified: Secondary | ICD-10-CM | POA: Insufficient documentation

## 2012-04-16 DIAGNOSIS — Q8500 Neurofibromatosis, unspecified: Secondary | ICD-10-CM | POA: Insufficient documentation

## 2012-04-16 DIAGNOSIS — L299 Pruritus, unspecified: Secondary | ICD-10-CM | POA: Insufficient documentation

## 2012-04-16 MED ORDER — PREDNISONE 50 MG PO TABS: ORAL_TABLET | ORAL | Status: DC

## 2012-04-16 MED ORDER — ACETAMINOPHEN-CODEINE #3 300-30 MG PO TABS: 1.0000 | ORAL_TABLET | Freq: Four times a day (QID) | ORAL | Status: DC | PRN

## 2012-04-16 MED ORDER — PREDNISONE 20 MG PO TABS
60.0000 mg | ORAL_TABLET | Freq: Once | ORAL | Status: AC
  Administered 2012-04-16: 60 mg via ORAL
  Filled 2012-04-16: qty 3

## 2012-04-16 NOTE — ED Notes (Signed)
Family at bedside. 

## 2012-04-16 NOTE — ED Notes (Signed)
WUJ:[id number]<JY> Expected date:<BR> Expected time:<BR> Means of arrival:Ambulance<BR> Comments:<BR> ems- allergic reaction

## 2012-04-16 NOTE — ED Provider Notes (Signed)
History     CSN: [id number]  Arrival date & time 04/16/12  1830   First MD Initiated Contact with Patient 04/16/12 1950      Chief Complaint  Patient presents with  . Allergic Reaction    (Consider location/radiation/quality/duration/timing/severity/associated sxs/prior treatment) HPI Pt presents with c/o itching rash and hives which began this afternoon approx 4pm.  He also felt that his tongue was itching.  No lip or tongue swelling, no difficulty breathing.  No vomiting.  He took benadryl shortly after 4pm- the itching worsened initially, but then began to resolve.  He states he has had similar reactions in the past but has not been diagnosed with any type of allergy before. He denies new foods, no new medications or exposures.  In ED he c/o some mild continued itching but is overall improving.  There are no other associated systemic symptoms, there are no other alleviating or modifying factors.   Past Medical History  Diagnosis Date  . COPD (chronic obstructive pulmonary disease)   . Bronchitis     11/2011  . Neurofibromatosis     diagnosed 2013  . Hypertension goal BP (blood pressure) < 140/90 12/21/2011    Past Surgical History  Procedure Date  . None     Family History  Problem Relation Age of Onset  . Diabetes Mother     and father  . Hypertension Mother     and father  . Neurofibromatosis Brother     and 2 daughters    History  Substance Use Topics  . Smoking status: Former Smoker -- 0.5 packs/day for 17 years    Types: Cigarettes    Quit date: 12/05/2011  . Smokeless tobacco: Not on file  . Alcohol Use: No      Review of Systems ROS reviewed and all otherwise negative except for mentioned in HPI  Allergies  Review of patient's allergies indicates no known allergies.  Home Medications   Current Outpatient Rx  Name  Route  Sig  Dispense  Refill  . ACETAMINOPHEN-CODEINE #3 300-30 MG PO TABS   Oral   Take 1-2 tablets by mouth every 6 (six) hours  as needed. Pain         . ALBUTEROL SULFATE HFA 108 (90 BASE) MCG/ACT IN AERS   Inhalation   Inhale 2 puffs into the lungs every 6 (six) hours as needed. For shortness of breath/wheezing         . DIPHENHYDRAMINE HCL 25 MG PO TABS   Oral   Take 25 mg by mouth every 6 (six) hours as needed. Allergies         . GABAPENTIN 300 MG PO CAPS   Oral   Take 300 mg by mouth 2 (two) times daily.         Marland Kitchen HYDROCHLOROTHIAZIDE 12.5 MG PO CAPS               . OMEPRAZOLE 20 MG PO CPDR   Oral   Take 20 mg by mouth daily.         Marland Kitchen PREDNISONE 50 MG PO TABS      Take 1 po qD x 4 days   4 tablet   0     BP 131/82  Pulse 82  Temp 98.3 F (36.8 C) (Oral)  Resp 18  SpO2 97% Vitals reviewed Physical Exam Physical Examination: General appearance - alert, well appearing, and in no distress Mental status - alert, oriented to person, place, and time Eyes -  no conjunctival injection or scleral icterus Mouth - mucous membranes moist, pharynx normal without lesions, no lip or tongue swelling Chest - clear to auscultation, no wheezes, rales or rhonchi, symmetric air entry, no increased respiratory effort Heart - normal rate, regular rhythm, normal S1, S2, no murmurs, rubs, clicks or gallops Abdomen - soft, nontender, nondistended, no masses or organomegaly Extremities - peripheral pulses normal, no pedal edema, no clubbing or cyanosis Skin - normal coloration and turgor, no rashes, several nodular lesions on extremities- chronic, no hives present ED Course  Procedures (including critical care time)  Labs Reviewed - No data to display No results found.   1. Hives       MDM  Pt presenting with pruritic rash over face and arms.  No signs or symptoms of anaphylaxis.  Pt had taken benadryl prior to arrival, was started on prednisone in ED.  Discharged with strict return precautions.  Pt agreeable with plan. He was advised to f/u with PMD and may need allergy testing due to having  similar reactions in the past with unknown trigger        Ethelda Chick, MD 04/16/12 2320

## 2012-04-16 NOTE — ED Notes (Addendum)
Per EMS: around 1630 pt started breaking in hives, took 50mg  of benadryl, felt no relief, pt stated he feels oral swelling too. Per EMS wife reported he is having allergic reactions since 2006 intermittently, does not know what's causing them

## 2012-04-16 NOTE — Telephone Encounter (Signed)
Will have Rx for Tylenol #3 faxed.

## 2012-04-28 ENCOUNTER — Encounter: Payer: Self-pay | Admitting: Family Medicine

## 2012-04-28 ENCOUNTER — Ambulatory Visit (HOSPITAL_COMMUNITY)
Admission: RE | Admit: 2012-04-28 | Discharge: 2012-04-28 | Payer: Medicaid Other | Source: Ambulatory Visit | Attending: Diagnostic Neuroimaging | Admitting: Diagnostic Neuroimaging

## 2012-04-28 ENCOUNTER — Ambulatory Visit (HOSPITAL_COMMUNITY): Admission: RE | Admit: 2012-04-28 | Payer: Medicaid Other | Source: Ambulatory Visit

## 2012-04-30 ENCOUNTER — Ambulatory Visit (HOSPITAL_COMMUNITY): Payer: Medicaid Other

## 2012-04-30 ENCOUNTER — Ambulatory Visit (HOSPITAL_COMMUNITY)
Admission: RE | Admit: 2012-04-30 | Discharge: 2012-04-30 | Disposition: A | Discharge status: Home / Self Care | Payer: Medicaid Other | Source: Ambulatory Visit | Attending: Diagnostic Neuroimaging | Admitting: Diagnostic Neuroimaging

## 2012-04-30 DIAGNOSIS — Q8501 Neurofibromatosis, type 1: Secondary | ICD-10-CM | POA: Insufficient documentation

## 2012-04-30 LAB — CREATININE, SERUM
Creatinine, Ser: 0.99 mg/dL (ref 0.50–1.35)
GFR calc non Af Amer: >90 mL/min (ref >90)

## 2012-04-30 LAB — BUN: BUN: 10 mg/dL (ref 6–23)

## 2012-04-30 MED ORDER — GADOBENATE DIMEGLUMINE 529 MG/ML IV SOLN
15.0000 mL | Freq: Once | INTRAVENOUS | Status: AC | PRN
  Administered 2012-04-30: 15 mL via INTRAVENOUS

## 2012-05-05 ENCOUNTER — Encounter: Payer: Self-pay | Admitting: Family Medicine

## 2012-05-05 ENCOUNTER — Ambulatory Visit (INDEPENDENT_AMBULATORY_CARE_PROVIDER_SITE_OTHER): Payer: Medicaid Other | Admitting: Family Medicine

## 2012-05-05 VITALS — BP 136/83 | HR 79 | Ht 66.0 in | Wt 164.0 lb

## 2012-05-05 DIAGNOSIS — R0789 Other chest pain: Secondary | ICD-10-CM

## 2012-05-05 DIAGNOSIS — R042 Hemoptysis: Secondary | ICD-10-CM

## 2012-05-05 DIAGNOSIS — F17200 Nicotine dependence, unspecified, uncomplicated: Secondary | ICD-10-CM

## 2012-05-05 DIAGNOSIS — Z720 Tobacco use: Secondary | ICD-10-CM

## 2012-05-05 LAB — CBC
MCH: 30.4 pg (ref 26.0–34.0)
MCHC: 32.3 g/dL (ref 30.0–36.0)
Platelets: 226 10*3/uL (ref 150–400)
RBC: 4.96 MIL/uL (ref 4.22–5.81)

## 2012-05-05 NOTE — Progress Notes (Signed)
Subjective:  1. Blood in mouth/hemoptypsis-Patient continues to complain of waking up in AM with blood in his mouth after he coughs since mid-august. has been seen by ENT with normal nasopharyngolaryngoscopy in mid August. Advised omnaris spray BID which patient states has not helped. He took a full bottle of this. Does not note biting his mouth or tender spots in his mucosa. Has had negative TB skin test. Denies elicit drug use/snorting. Once again never happens in day time. Does have history of GERD but on omeprazole. Patient does have intermittent SOB but mainly he takes his albuterol before bedtime. Patient does deny BRBPR or melena. Denies nausea/vomiting/fever/chills/fatigue/overall sick feelings/weight loss.   2. Neurofibromatosis with related Chest pain and back pain-patient tolerating 300mg  BID gabapentin well. States overall pain much better controlled. Uses tylenol #3 maybe once per day. He is to see neurology within the month with possible titration. No refills needed.Chest pain mainly in T1-T4 distribution of chest in similar area to neurofibroma scalloping along spine.   3. Tobacco abuse-patient now smoke free for 6 months. Congratulated patient.   ROS--See HPI  Past Medical History-smoking status noted: former smoker. ? COPD on CXR but normal PFTs.  Reviewed problem list.  Medications- reviewed and updated Chief complaint-noted  Objective: BP 136/83  Pulse 79  Ht 5\' 6"  (1.676 m)  Wt 164 lb (74.39 kg)  BMI 26.47 kg/m2 Gen: NAD HEENT: pharynx nonerythematous, no areas of scarring or irritation. Caries noted in left upper teeth. No signs of bleeding or potential sources of bleeding noted CV: RRR no mrg Lungs: CTAB Skin: diffuse neurofibromas Ext: No edema, moves all extremities  Assessment/Plan: See problem oriented charted

## 2012-05-05 NOTE — Assessment & Plan Note (Addendum)
Difficult to tell if hemoptysis/just blood pooling in mouth/if from upper or lower source. Will obtain records from previous PCP (previously attempted). Will also obtain CBC (to see if significant Hgb change), cmet (to monitor LFTs), PT/INR/PTT (coags but no history of bleeding disorder) as well as repeat CXR. Will ask for input from patient's neurologist for further decision. Will await results of testing but patient with possible repeat ENT referral, GI referral for endoscopy, hematology referral, or pulmonary referral (with addition of possible dentistry). Patient with possible small aneurysm but indeterminate location. Also cannot guaic "blood" as patient never has in daytime.

## 2012-05-05 NOTE — Patient Instructions (Signed)
I am glad your pain is better. Keep taking the gabapentin. We can go up on it later if your pain becomes worse.  Great job on quitting smoking!  We are going to get some lab tests to try to figure out why you are having blood in your mouth in the mornings.  Go to [facility] to get your chest x-ray.  If I discover anything that points me in one direction off your labs, I will refer you at that time.  Ask your neurologist about the following options (ENT repeat as you have had normal mouth and throat scope, GI -stomach doctors, Pulmonary-lung doctors, hematology-blood doctors but you have no history of bleeding easily in family).

## 2012-05-05 NOTE — Assessment & Plan Note (Signed)
Improved on gabapentin (neurology may titrate up further) with prn use of tylenol #3. No refills needed today. Patient with stable MRI with plexiform neurofibroma. Patient to see neurology later this month. Presume plan will be to continue monitoring q6-q12 month MRIs.

## 2012-05-05 NOTE — Assessment & Plan Note (Signed)
Praised patient on cessation. Will add former smoker to history and remove from problem list as >6 months.

## 2012-05-06 LAB — DRUG SCR UR, PAIN MGMT, REFLEX CONF
Benzodiazepines.: NEGATIVE
Marijuana Metabolite: NEGATIVE
Methadone: NEGATIVE
Opiates: NEGATIVE
Propoxyphene: NEGATIVE

## 2012-05-06 LAB — COMPREHENSIVE METABOLIC PANEL
ALT: 17 U/L (ref 0–53)
AST: 18 U/L (ref 0–37)
Alkaline Phosphatase: 75 U/L (ref 39–117)
Sodium: 141 mEq/L (ref 135–145)
Total Bilirubin: 0.4 mg/dL (ref 0.3–1.2)
Total Protein: 6.9 g/dL (ref 6.0–8.3)

## 2012-05-11 ENCOUNTER — Encounter (HOSPITAL_COMMUNITY): Payer: Self-pay | Admitting: *Deleted

## 2012-05-11 ENCOUNTER — Emergency Department (HOSPITAL_COMMUNITY)
Admission: EM | Admit: 2012-05-11 | Discharge: 2012-05-11 | Disposition: A | Discharge status: Home / Self Care | Payer: Medicaid Other | Attending: Emergency Medicine | Admitting: Emergency Medicine

## 2012-05-11 DIAGNOSIS — K922 Gastrointestinal hemorrhage, unspecified: Secondary | ICD-10-CM | POA: Insufficient documentation

## 2012-05-11 DIAGNOSIS — J4489 Other specified chronic obstructive pulmonary disease: Secondary | ICD-10-CM | POA: Insufficient documentation

## 2012-05-11 DIAGNOSIS — Z87891 Personal history of nicotine dependence: Secondary | ICD-10-CM | POA: Insufficient documentation

## 2012-05-11 DIAGNOSIS — I10 Essential (primary) hypertension: Secondary | ICD-10-CM | POA: Insufficient documentation

## 2012-05-11 DIAGNOSIS — J449 Chronic obstructive pulmonary disease, unspecified: Secondary | ICD-10-CM | POA: Insufficient documentation

## 2012-05-11 DIAGNOSIS — Z79899 Other long term (current) drug therapy: Secondary | ICD-10-CM | POA: Insufficient documentation

## 2012-05-11 LAB — CBC WITH DIFFERENTIAL/PLATELET
Eosinophils Absolute: 0.8 10*3/uL — ABNORMAL HIGH (ref 0.0–0.7)
Eosinophils Relative: 9 % — ABNORMAL HIGH (ref 0–5)
HCT: 43.9 % (ref 39.0–52.0)
Hemoglobin: 15.4 g/dL (ref 13.0–17.0)
Lymphs Abs: 3 10*3/uL (ref 0.7–4.0)
MCH: 31.2 pg (ref 26.0–34.0)
MCV: 88.9 fL (ref 78.0–100.0)
Monocytes Relative: 8 % (ref 3–12)
Neutrophils Relative %: 48 % (ref 43–77)
RBC: 4.94 MIL/uL (ref 4.22–5.81)

## 2012-05-11 LAB — COMPREHENSIVE METABOLIC PANEL
Alkaline Phosphatase: 98 U/L (ref 39–117)
BUN: 8 mg/dL (ref 6–23)
GFR calc Af Amer: >90 mL/min (ref >90)
Glucose, Bld: 76 mg/dL (ref 70–99)
Potassium: 3.6 mEq/L (ref 3.5–5.1)
Total Protein: 7.6 g/dL (ref 6.0–8.3)

## 2012-05-11 LAB — LIPASE, BLOOD: Lipase: 15 U/L (ref 11–59)

## 2012-05-11 LAB — APTT: aPTT: 32 seconds (ref 24–37)

## 2012-05-11 MED ORDER — ONDANSETRON HCL 4 MG/2ML IJ SOLN
4.0000 mg | Freq: Once | INTRAMUSCULAR | Status: AC
  Administered 2012-05-11: 4 mg via INTRAVENOUS
  Filled 2012-05-11: qty 2

## 2012-05-11 MED ORDER — SUCRALFATE 1 G PO TABS: 1.0000 g | ORAL_TABLET | Freq: Four times a day (QID) | ORAL | Status: DC

## 2012-05-11 MED ORDER — PANTOPRAZOLE SODIUM 40 MG IV SOLR
40.0000 mg | Freq: Once | INTRAVENOUS | Status: AC
  Administered 2012-05-11: 40 mg via INTRAVENOUS
  Filled 2012-05-11: qty 40

## 2012-05-11 MED ORDER — PANTOPRAZOLE SODIUM 20 MG PO TBEC: 40.0000 mg | DELAYED_RELEASE_TABLET | Freq: Every day | ORAL | Status: DC

## 2012-05-11 MED ORDER — SODIUM CHLORIDE 0.9 % IV SOLN
INTRAVENOUS | Status: DC
  Administered 2012-05-11: 18:00:00 via INTRAVENOUS

## 2012-05-11 NOTE — ED Provider Notes (Signed)
History     CSN: [id number]  Arrival date & time 05/11/12  1617   First MD Initiated Contact with Patient 05/11/12 1627      Chief Complaint  Patient presents with  . Hematemesis    (Consider location/radiation/quality/duration/timing/severity/associated sxs/prior treatment) The history is provided by the patient and a friend.   Patient here complaining of vomiting blood x2 days. States that he has had nonbilious emesis with streaks of blood in it. Also notes epigastric pain. He has not been compliant with his Prilosec and does admit to tobacco use. Denies any excessive NSAID use. No syncope or near-syncope. No prior history of same. Past Medical History  Diagnosis Date  . COPD (chronic obstructive pulmonary disease)   . Bronchitis     11/2011  . Neurofibromatosis     diagnosed 2013  . Hypertension goal BP (blood pressure) < 140/90 12/21/2011  . Former smoker     Past Surgical History  Procedure Date  . None     Family History  Problem Relation Age of Onset  . Diabetes Mother     and father  . Hypertension Mother     and father  . Neurofibromatosis Brother     and 2 daughters    History  Substance Use Topics  . Smoking status: Former Smoker -- 0.5 packs/day for 17 years    Types: Cigarettes    Quit date: 12/05/2011  . Smokeless tobacco: Not on file  . Alcohol Use: No      Review of Systems  All other systems reviewed and are negative.    Allergies  Review of patient's allergies indicates no known allergies.  Home Medications   Current Outpatient Rx  Name  Route  Sig  Dispense  Refill  . ACETAMINOPHEN-CODEINE #3 300-30 MG PO TABS   Oral   Take 1-2 tablets by mouth every 6 (six) hours as needed. Pain         . ALBUTEROL SULFATE HFA 108 (90 BASE) MCG/ACT IN AERS   Inhalation   Inhale 2 puffs into the lungs every 6 (six) hours as needed. For shortness of breath/wheezing         . GABAPENTIN 300 MG PO CAPS   Oral   Take 300 mg by mouth 2  (two) times daily.         Marland Kitchen HYDROCHLOROTHIAZIDE 12.5 MG PO CAPS               . OMEPRAZOLE 20 MG PO CPDR   Oral   Take 20 mg by mouth daily.           BP 131/77  Pulse 77  Temp 97.8 F (36.6 C) (Oral)  Resp 18  SpO2 98%  Physical Exam  Nursing note and vitals reviewed. Constitutional: He is oriented to person, place, and time. He appears well-developed and well-nourished.  Non-toxic appearance. No distress.  HENT:  Head: Normocephalic and atraumatic.  Eyes: Conjunctivae normal, EOM and lids are normal. Pupils are equal, round, and reactive to light.  Neck: Normal range of motion. Neck supple. No tracheal deviation present. No mass present.  Cardiovascular: Normal rate, regular rhythm and normal heart sounds.  Exam reveals no gallop.   No murmur heard. Pulmonary/Chest: Effort normal and breath sounds normal. No stridor. No respiratory distress. He has no decreased breath sounds. He has no wheezes. He has no rhonchi. He has no rales.  Abdominal: Soft. Normal appearance and bowel sounds are normal. He exhibits no distension. There  is tenderness in the epigastric area. There is no rigidity, no rebound, no guarding and no CVA tenderness.  Musculoskeletal: Normal range of motion. He exhibits no edema and no tenderness.  Neurological: He is alert and oriented to person, place, and time. He has normal strength. No cranial nerve deficit or sensory deficit. GCS eye subscore is 4. GCS verbal subscore is 5. GCS motor subscore is 6.  Skin: Skin is warm and dry. No abrasion and no rash noted.  Psychiatric: He has a normal mood and affect. His speech is normal and behavior is normal.    ED Course  Procedures (including critical care time)   Labs Reviewed  CBC WITH DIFFERENTIAL  COMPREHENSIVE METABOLIC PANEL  LIPASE, BLOOD  PROTIME-INR  APTT   No results found.   No diagnosis found.    MDM  pts hemoglobin stable. Patient treated for likely peptic ulcer disease. He has no  signs of hemodynamic instability at this time. I will place patient on Carafate and place him back on his PPI. He was given GI referral and return instructions concerning his upper GI bleeding       Toy Baker, MD 05/11/12 1746

## 2012-05-11 NOTE — ED Notes (Signed)
Pt reports vomiting small amounts of bright red blood x 2 days and has sore throat, pain when swallowing. Airway intact.

## 2012-05-13 ENCOUNTER — Encounter (HOSPITAL_COMMUNITY): Payer: Self-pay | Admitting: Pharmacy Technician

## 2012-05-13 ENCOUNTER — Encounter (HOSPITAL_COMMUNITY): Payer: Self-pay | Admitting: *Deleted

## 2012-05-14 ENCOUNTER — Ambulatory Visit (HOSPITAL_COMMUNITY)
Admission: RE | Admit: 2012-05-14 | Discharge: 2012-05-14 | Disposition: A | Discharge status: Home / Self Care | Payer: Medicaid Other | Source: Ambulatory Visit | Attending: Gastroenterology | Admitting: Gastroenterology

## 2012-05-14 ENCOUNTER — Ambulatory Visit (HOSPITAL_COMMUNITY): Payer: Medicaid Other | Admitting: Anesthesiology

## 2012-05-14 ENCOUNTER — Encounter (HOSPITAL_COMMUNITY): Payer: Self-pay | Admitting: Anesthesiology

## 2012-05-14 ENCOUNTER — Encounter (HOSPITAL_COMMUNITY)
Admission: RE | Disposition: A | Discharge status: Home / Self Care | Payer: Self-pay | Source: Ambulatory Visit | Attending: Gastroenterology

## 2012-05-14 DIAGNOSIS — K449 Diaphragmatic hernia without obstruction or gangrene: Secondary | ICD-10-CM | POA: Insufficient documentation

## 2012-05-14 DIAGNOSIS — R1310 Dysphagia, unspecified: Secondary | ICD-10-CM | POA: Insufficient documentation

## 2012-05-14 DIAGNOSIS — K920 Hematemesis: Secondary | ICD-10-CM | POA: Insufficient documentation

## 2012-05-14 DIAGNOSIS — R109 Unspecified abdominal pain: Secondary | ICD-10-CM | POA: Insufficient documentation

## 2012-05-14 HISTORY — PX: ESOPHAGOGASTRODUODENOSCOPY (EGD) WITH PROPOFOL: SHX5813

## 2012-05-14 HISTORY — DX: Hematemesis: K92.0

## 2012-05-14 HISTORY — DX: Dizziness and giddiness: R42

## 2012-05-14 HISTORY — DX: Shortness of breath: R06.02

## 2012-05-14 HISTORY — DX: Sleep disorder, unspecified: G47.9

## 2012-05-14 HISTORY — DX: Gastro-esophageal reflux disease without esophagitis: K21.9

## 2012-05-14 HISTORY — DX: Melena: K92.1

## 2012-05-14 SURGERY — ESOPHAGOGASTRODUODENOSCOPY (EGD) WITH PROPOFOL
Anesthesia: Monitor Anesthesia Care

## 2012-05-14 MED ORDER — MIDAZOLAM HCL 5 MG/5ML IJ SOLN
INTRAMUSCULAR | Status: DC | PRN
  Administered 2012-05-14 (×2): 1 mg via INTRAVENOUS

## 2012-05-14 MED ORDER — PROMETHAZINE HCL 25 MG/ML IJ SOLN: 6.2500 mg | INTRAMUSCULAR | Status: DC | PRN

## 2012-05-14 MED ORDER — BUTAMBEN-TETRACAINE-BENZOCAINE 2-2-14 % EX AERO
INHALATION_SPRAY | CUTANEOUS | Status: DC | PRN
  Administered 2012-05-14: 2 via TOPICAL

## 2012-05-14 MED ORDER — SODIUM CHLORIDE 0.9 % IV SOLN: INTRAVENOUS | Status: DC

## 2012-05-14 MED ORDER — KETAMINE HCL 10 MG/ML IJ SOLN
INTRAMUSCULAR | Status: DC | PRN
  Administered 2012-05-14: 10 mg via INTRAVENOUS

## 2012-05-14 MED ORDER — FENTANYL CITRATE 0.05 MG/ML IJ SOLN
INTRAMUSCULAR | Status: DC | PRN
  Administered 2012-05-14 (×2): 50 ug via INTRAVENOUS

## 2012-05-14 MED ORDER — PROPOFOL 10 MG/ML IV EMUL
INTRAVENOUS | Status: DC | PRN
  Administered 2012-05-14: 120 ug/kg/min via INTRAVENOUS

## 2012-05-14 MED ORDER — LACTATED RINGERS IV SOLN
INTRAVENOUS | Status: DC
  Administered 2012-05-14: 1000 mL via INTRAVENOUS

## 2012-05-14 MED ORDER — LACTATED RINGERS IV SOLN
INTRAVENOUS | Status: DC | PRN
  Administered 2012-05-14: 09:00:00 via INTRAVENOUS

## 2012-05-14 SURGICAL SUPPLY — 15 items

## 2012-05-14 NOTE — Anesthesia Preprocedure Evaluation (Signed)
Anesthesia Evaluation  Patient identified by MRN, date of birth, ID band Patient awake    Reviewed: Allergy & Precautions, H&P , NPO status , Patient's Chart, lab work & pertinent test results  Airway Mallampati: II TM Distance: >3 FB Neck ROM: Full    Dental No notable dental hx.    Pulmonary COPD breath sounds clear to auscultation  Pulmonary exam normal       Cardiovascular hypertension, Pt. on medications Rhythm:Regular Rate:Normal     Neuro/Psych Neurofibromatosis  negative psych ROS   GI/Hepatic negative GI ROS, Neg liver ROS,   Endo/Other  negative endocrine ROS  Renal/GU negative Renal ROS  negative genitourinary   Musculoskeletal negative musculoskeletal ROS (+)   Abdominal   Peds negative pediatric ROS (+)  Hematology negative hematology ROS (+)   Anesthesia Other Findings   Reproductive/Obstetrics negative OB ROS                           Anesthesia Physical Anesthesia Plan  ASA: II  Anesthesia Plan: MAC   Post-op Pain Management:    Induction: Intravenous  Airway Management Planned: Nasal Cannula  Additional Equipment:   Intra-op Plan:   Post-operative Plan:   Informed Consent: I have reviewed the patients History and Physical, chart, labs and discussed the procedure including the risks, benefits and alternatives for the proposed anesthesia with the patient or authorized representative who has indicated his/her understanding and acceptance.     Plan Discussed with: CRNA and Surgeon  Anesthesia Plan Comments:         Anesthesia Quick Evaluation

## 2012-05-14 NOTE — H&P (Signed)
Patient interval history reviewed.  Patient examined again.  There has been no change from documented H/P dated 05/13/12  (scanned into chart from our office) except as documented above.  Assessment:  1.  Abdominal pain. 2.  Hematemesis. 3.  Odynophagia.  Plan:  1.  Endoscopy. 2.  Risks (bleeding, infection, bowel perforation that could require surgery, sedation-related changes in cardiopulmonary systems), benefits (identification and possible treatment of source of symptoms, exclusion of certain causes of symptoms), and alternatives (watchful waiting, radiographic imaging studies, empiric medical treatment) of upper endoscopy (EGD) were explained to patient in detail and he wishes to proceed.

## 2012-05-14 NOTE — Anesthesia Postprocedure Evaluation (Signed)
  Anesthesia Post-op Note  Patient: James [name]  Procedure(s) Performed: Procedure(s) (LRB): ESOPHAGOGASTRODUODENOSCOPY (EGD) WITH PROPOFOL (N/A)  Patient Location: PACU  Anesthesia Type: MAC  Level of Consciousness: awake and alert   Airway and Oxygen Therapy: Patient Spontanous Breathing  Post-op Pain: mild  Post-op Assessment: Post-op Vital signs reviewed, Patient's Cardiovascular Status Stable, Respiratory Function Stable, Patent Airway and No signs of Nausea or vomiting  Last Vitals:  Filed Vitals:   05/14/12 0930  BP: 131/92  Pulse:   Temp:   Resp: 14    Post-op Vital Signs: stable   Complications: No apparent anesthesia complications

## 2012-05-14 NOTE — Preoperative (Signed)
Beta Blockers   Reason not to administer Beta Blockers:Not Applicable 

## 2012-05-14 NOTE — Transfer of Care (Signed)
Immediate Anesthesia Transfer of Care Note  Patient: James [name]  Procedure(s) Performed: Procedure(s) (LRB) with comments: ESOPHAGOGASTRODUODENOSCOPY (EGD) WITH PROPOFOL (N/A)  Patient Location: PACU and Endoscopy Unit  Anesthesia Type:MAC  Level of Consciousness: awake, alert , oriented and patient cooperative  Airway & Oxygen Therapy: Patient Spontanous Breathing and Patient connected to nasal cannula oxygen  Post-op Assessment: Report given to PACU RN, Post -op Vital signs reviewed and stable and Patient moving all extremities  Post vital signs: Reviewed and stable  Complications: No apparent anesthesia complications

## 2012-05-14 NOTE — Op Note (Signed)
[facility] [address] [place], [address]   ENDOSCOPY PROCEDURE REPORT  PATIENT: James [name], James [name]  MR#: [id number] BIRTHDATE: March 11, 1976 , 36  yrs. old GENDER: Male ENDOSCOPIST: Willis Modena, MD REFERRED BY:  Lavonne Chick, MD PROCEDURE DATE:  05/14/2012 PROCEDURE:  EGD, diagnostic ASA CLASS:     Class II INDICATIONS:  odynophagia, abdominal pain, hematemesis. MEDICATIONS: MAC sedation, administered by CRNA TOPICAL ANESTHETIC: Cetacaine Spray  DESCRIPTION OF PROCEDURE: After the risks benefits and alternatives of the procedure were thoroughly explained, informed consent was obtained.  The [id number] endoscope was introduced through the mouth and advanced to the second portion of the duodenum. Without limitations.  The instrument was slowly withdrawn as the mucosa was fully examined.    Findings:  Small hiatal hernia with widely patent Schatzki's ring (not dilated, as patient is not having dysphagia).  Irregular but otherwise normal appearing Z-line.  Otherwise normal esophagus, specifically no esophagitis, Mallory-Weiss  tear, or mucosal features of eosinophilic esophagitis.  Normal stomach, pylorus, and duodenum to the second portion.            The scope was then withdrawn from the patient and the procedure completed.  ENDOSCOPIC IMPRESSION:     As above.  No explanation for patient's symptoms identified.  Wonder if "hematemesis" is actually hemoptysis.  RECOMMENDATIONS:     1.  Watch for potential complications  of procedure. 2.  Continue sucralfate and pantoprazole for 6 weeks, then stop. 3.  No further GI tract evaluation needed at this time. 4.  Follow-up with [facility] on as-needed basis.  eSigned:  Willis Modena, MD 05/14/2012 9:17 AM   CC:

## 2012-05-15 ENCOUNTER — Encounter (HOSPITAL_COMMUNITY): Payer: Self-pay | Admitting: Gastroenterology

## 2012-05-27 ENCOUNTER — Encounter: Payer: Self-pay | Admitting: Family Medicine

## 2012-05-28 DIAGNOSIS — G709 Myoneural disorder, unspecified: Secondary | ICD-10-CM

## 2012-05-28 HISTORY — DX: Myoneural disorder, unspecified: G70.9

## 2012-06-10 ENCOUNTER — Other Ambulatory Visit: Payer: Self-pay | Admitting: Family Medicine

## 2012-06-10 MED ORDER — ACETAMINOPHEN-CODEINE #3 300-30 MG PO TABS: 1.0000 | ORAL_TABLET | Freq: Four times a day (QID) | ORAL | Status: DC | PRN

## 2012-06-10 NOTE — Telephone Encounter (Signed)
Given 2-3 months tylenol #3 for pain from neurofibromatosis.

## 2012-06-13 ENCOUNTER — Other Ambulatory Visit: Payer: Self-pay | Admitting: Family Medicine

## 2012-06-17 NOTE — Telephone Encounter (Signed)
Pt is now out - needs this called in asap

## 2012-06-23 ENCOUNTER — Other Ambulatory Visit: Payer: Self-pay | Admitting: *Deleted

## 2012-06-23 MED ORDER — PANTOPRAZOLE SODIUM 20 MG PO TBEC: 40.0000 mg | DELAYED_RELEASE_TABLET | Freq: Every day | ORAL | Status: DC

## 2012-07-23 ENCOUNTER — Telehealth: Payer: Self-pay | Admitting: Family Medicine

## 2012-07-23 MED ORDER — ACETAMINOPHEN-CODEINE #3 300-30 MG PO TABS: 1.0000 | ORAL_TABLET | Freq: Four times a day (QID) | ORAL | Status: DC | PRN

## 2012-07-23 NOTE — Telephone Encounter (Signed)
Faxed in rx tylenol #3. Needs appt before future refills.

## 2012-07-31 ENCOUNTER — Ambulatory Visit (INDEPENDENT_AMBULATORY_CARE_PROVIDER_SITE_OTHER): Payer: Medicaid Other | Admitting: Family Medicine

## 2012-07-31 ENCOUNTER — Encounter: Payer: Self-pay | Admitting: Family Medicine

## 2012-07-31 VITALS — BP 133/82 | HR 90 | Temp 98.4°F | Ht 66.0 in | Wt 167.0 lb

## 2012-07-31 DIAGNOSIS — M549 Dorsalgia, unspecified: Secondary | ICD-10-CM

## 2012-07-31 DIAGNOSIS — R042 Hemoptysis: Secondary | ICD-10-CM

## 2012-07-31 MED ORDER — ACETAMINOPHEN-CODEINE #3 300-30 MG PO TABS: 1.0000 | ORAL_TABLET | Freq: Three times a day (TID) | ORAL | Status: DC | PRN

## 2012-07-31 NOTE — Assessment & Plan Note (Signed)
Well-controlled. Continue HCTZ

## 2012-07-31 NOTE — Patient Instructions (Addendum)
For your neurofibromatosis and the lesion near your spine, we have refilled your pain meds for 6 months. We will see you in 6 months and do refills. Yearly we will get a MRI as you had to evaluate the lesion. We will send you to neurosurgery if needed but you do not need to see neurology unless you desire to do so. I would also like you to start back on gabapentin 300 mg nightly. We can work our way up on this medicine if you arent having the belly issues.   The hernia they found was very minor fortunately.   Your blood pressure looks great today. Keep taking the hydrochlorothiazide.   Thanks and see you in 6 months,  Dr. Durene Cal

## 2012-07-31 NOTE — Assessment & Plan Note (Signed)
Resolved with use of PPI after seeing GI.

## 2012-07-31 NOTE — Progress Notes (Signed)
Subjective:   1. Neurofibromatosis with related Chest pain-stopped taking gabapentin at BID dosing due to some upset stomach. Using tylenol #3 once per day. Improves pain to 2/10 from 5/10 when using (mainly at night to help sleep). Neurology did think pain was related to large lesion along spine. Chest pain mainly in T1-T4 distribution of chest in similar area to neurofibroma scalloping along spine. Not keeping him from doing his normal tasks as long as he can take tylenol #3 to help him sleep.   2. Hypertension- BP Readings from Last 3 Encounters:  07/31/12 133/82  05/14/12 137/79  05/14/12 137/79   Home BP monitoring-no Compliant with medications-yes without side effects HCTZ only Denies any HA, SOB (except baseline very occasional use albuterol), blurry vision, LE edema, transient weakness, orthopnea, PND. CP is noted but as above.    3. Hematemesis/blood in mouth-saw GI. Placed on PPI after negative EGD with exception of slight hiatal hernia. Patient states he has had no other episodes of blood in his mouth in AM.    ROS--See HPI  Past Medical History Patient Active Problem List  Diagnosis  . Shortness of breath  . 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 Neurofibromatosis  . Hypertension goal BP (blood pressure) < 140/90  . Oral mucosal lesion  . Hemoptysis  . Back pain    Reviewed problem list.  Medications- reviewed and updated Chief complaint-noted  Objective: BP 133/82  Pulse 90  Temp(Src) 98.4 F (36.9 C) (Oral)  Ht 5\' 6"  (1.676 m)  Wt 167 lb (75.751 kg)  BMI 26.97 kg/m2 Gen: NAD CV: RRR no mrg Chest: mild tenderness to palpation along upper left chest.  Lungs: CTAB Skin: diffuse neurofibromas Ext: No edema, moves all extremities  Assessment/Plan:  Follow up next visit to ensure continued smoking cessation

## 2012-07-31 NOTE — Assessment & Plan Note (Signed)
Per review of neurology notes-patient will need q6-q12 month MRI of cervical spine. Will refill tylenol #3 q6 months due to pain related to large lesion. . Encouraged use of gabapentin at least once nightly.

## 2012-08-12 ENCOUNTER — Other Ambulatory Visit: Payer: Self-pay | Admitting: Family Medicine

## 2012-09-22 IMAGING — CR DG CHEST 2V
2 series · 2 of 2 positions shown · non-contrast
Comparison: 12/05/2011

CLINICAL DATA: Left-sided chest pain. COPD.  Vomiting.
Neurofibromatosis.

CHEST - 2 VIEW

[w chest pa]
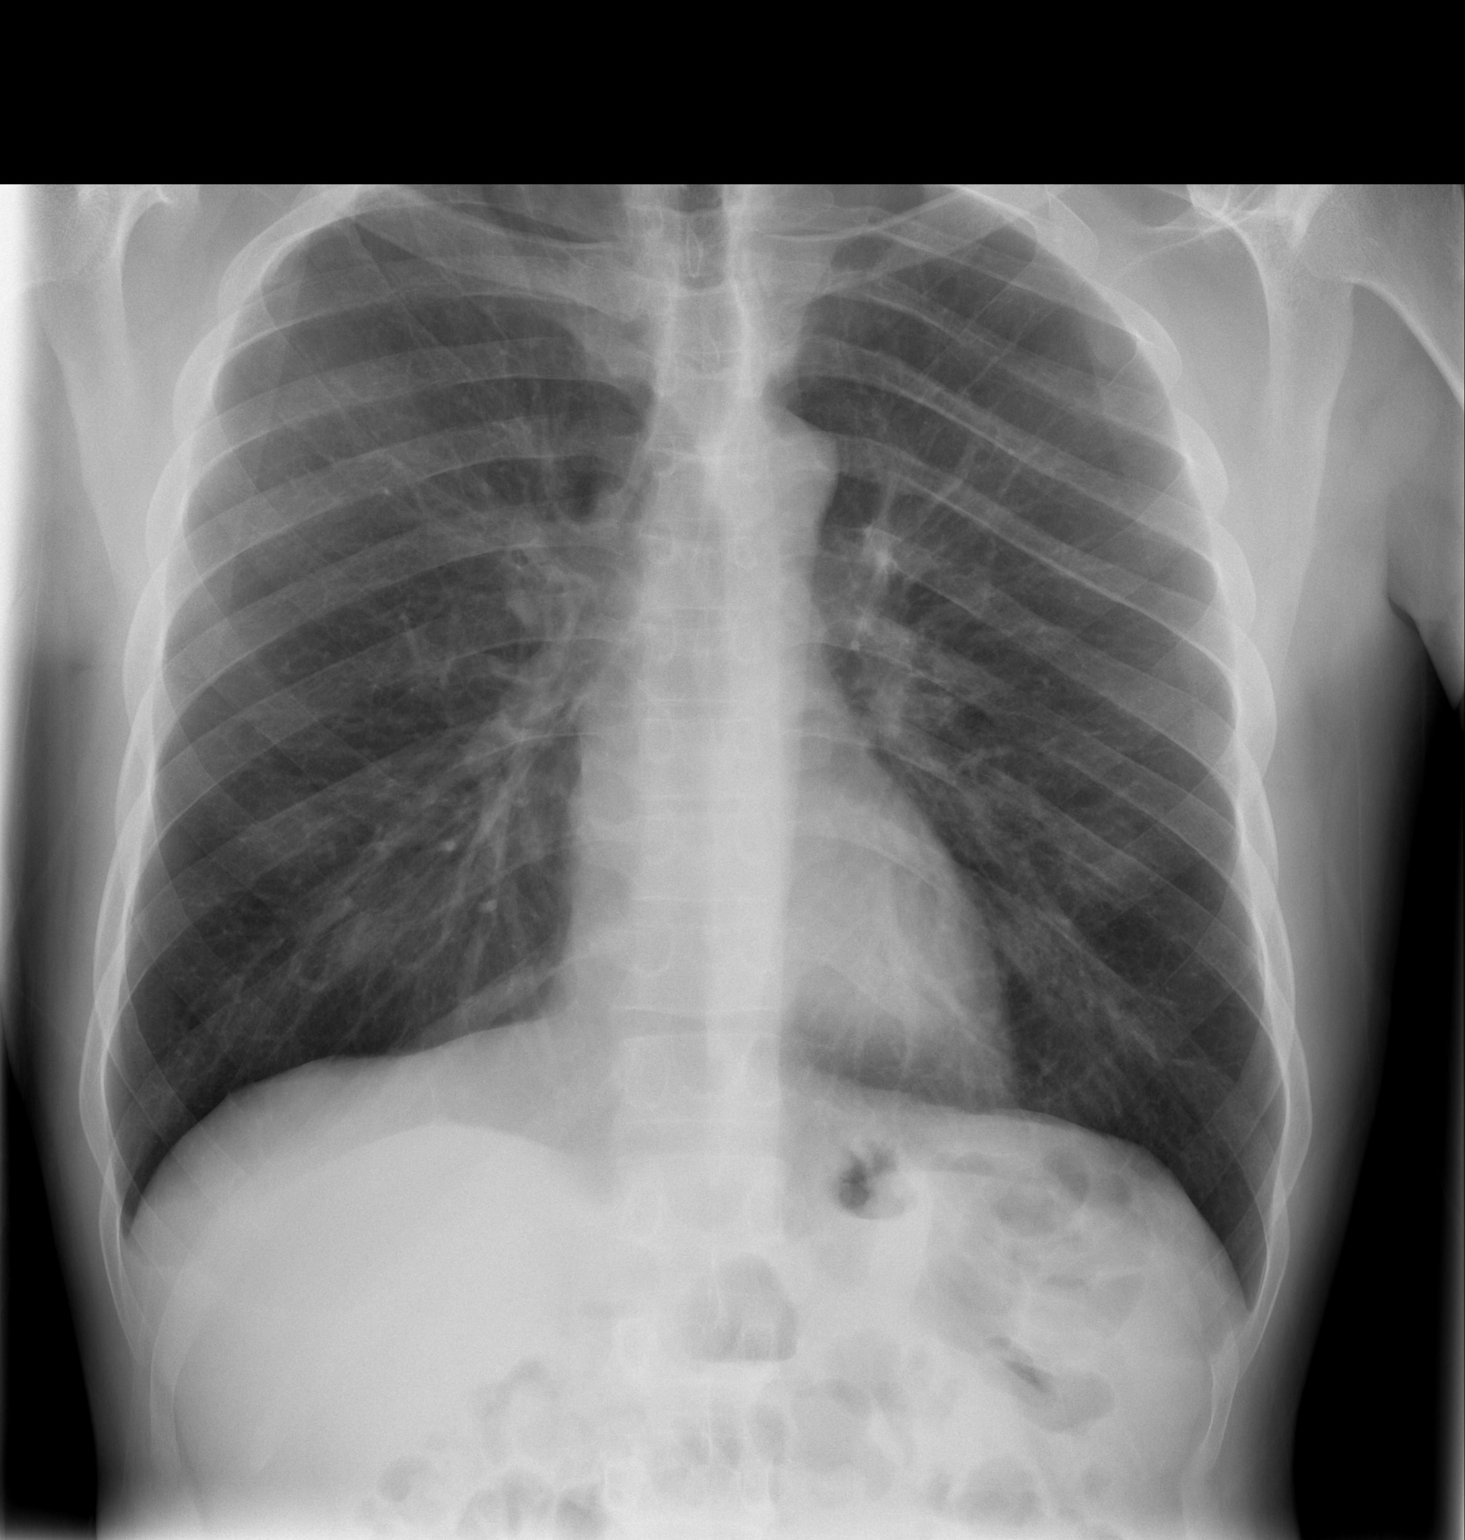

[w chest lat]
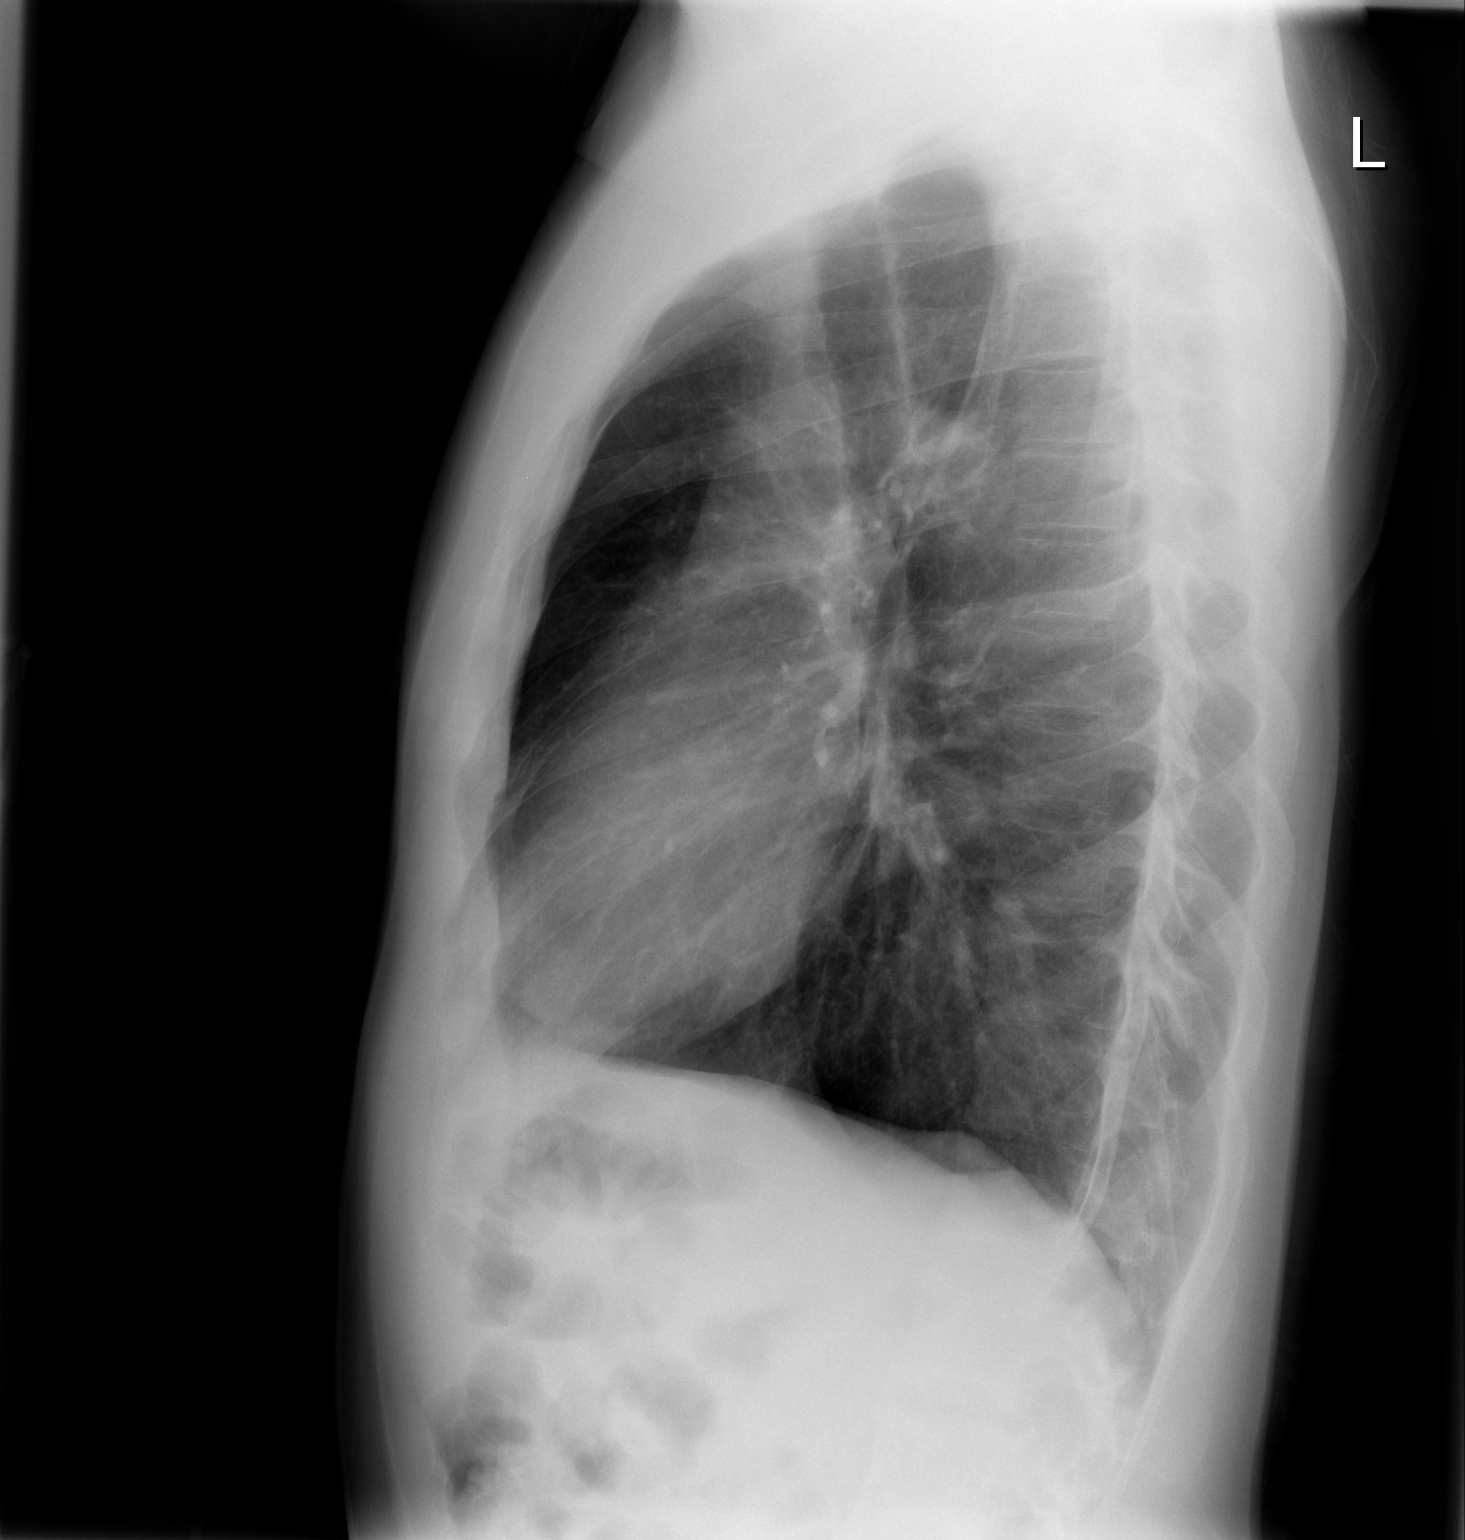

[2 of 2 positions shown; findings below may reference images not displayed]

FINDINGS: Pulmonary hyperinflation again seen, consistent with
COPD.  Both lungs are clear.  No evidence of pleural effusion or
pneumothorax.  Heart size is normal.  No mass or lymphadenopathy
identified.
IMPRESSION: COPD.  No active disease.

## 2012-10-01 ENCOUNTER — Ambulatory Visit (INDEPENDENT_AMBULATORY_CARE_PROVIDER_SITE_OTHER): Payer: Medicaid Other | Admitting: Diagnostic Neuroimaging

## 2012-10-01 ENCOUNTER — Encounter: Payer: Self-pay | Admitting: Diagnostic Neuroimaging

## 2012-10-01 ENCOUNTER — Ambulatory Visit (INDEPENDENT_AMBULATORY_CARE_PROVIDER_SITE_OTHER): Payer: Medicaid Other | Admitting: Family Medicine

## 2012-10-01 ENCOUNTER — Encounter: Payer: Self-pay | Admitting: Family Medicine

## 2012-10-01 VITALS — BP 111/68 | HR 86 | Temp 97.9°F | Ht 69.0 in | Wt 175.0 lb

## 2012-10-01 VITALS — BP 127/85 | HR 94 | Temp 98.0°F | Ht 66.0 in | Wt 175.0 lb

## 2012-10-01 DIAGNOSIS — H5712 Ocular pain, left eye: Secondary | ICD-10-CM

## 2012-10-01 DIAGNOSIS — H5710 Ocular pain, unspecified eye: Secondary | ICD-10-CM

## 2012-10-01 DIAGNOSIS — I10 Essential (primary) hypertension: Secondary | ICD-10-CM

## 2012-10-01 DIAGNOSIS — Q8501 Neurofibromatosis, type 1: Secondary | ICD-10-CM | POA: Insufficient documentation

## 2012-10-01 DIAGNOSIS — R51 Headache: Secondary | ICD-10-CM

## 2012-10-01 DIAGNOSIS — Q8500 Neurofibromatosis, unspecified: Secondary | ICD-10-CM

## 2012-10-01 NOTE — Progress Notes (Signed)
[facility]  PATIENT: James [name] DOB: [birth year]  REFERRING CLINICIAN:  HISTORY FROM: patient and wife REASON FOR VISIT: follow up   HISTORICAL  CHIEF COMPLAINT:  Chief Complaint  Patient presents with  . Follow-up    headache    HISTORY OF PRESENT ILLNESS:   UPDATE 10/01/12: Since last visit, patient has noted dermal neurofibromas are increasing in size, especially in his right chest and right arm. In the last week he has developed new-onset headache, with left retro-orbital eye pain. No nausea or vomiting. No photophobia or phonophobia. Headache is dull and frontal. He is using Tylenol No. 3 which seems to control his symptoms.  No new symptoms with his left axillary, left upper extremity region.  PRIOR HPI (01/21/12): 37 year old left-handed male with history of hypertension, here for evaluation of neurofibromatosis.  For past 4 months, patient has been having left chest pain, with numbness radiating in his axillary region down to his left arm. 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  Patient has multiple caf au lait spots, cutaneous neurofibromas as well. He has a brother who passed away from neurofibromatosis 2 years ago, after malignant transformation of a peripheral nerve tumor. Patient's mother likely has neurofibromatosis. Patient's 2 daughters also have neurofibromatosis, followed by pediatric neurology here in [place].  Patient denies any vision changes, bone problems, cognitive difficulties.  REVIEW OF SYSTEMS: Full 14 system review of systems performed and notable only for weight gain chest pain blurred vision shortness of breath snoring feeling of feeling cold increased thirst headache numbness weakness dizziness sleepiness snoring not to sleep disinterest in activities.  ALLERGIES: No Known  Allergies  HOME MEDICATIONS: Outpatient Prescriptions Prior to Visit  Medication Sig Dispense Refill  . acetaminophen-codeine (TYLENOL #3) 300-30 MG per tablet Take 1 tablet by mouth every 8 (eight) hours as needed for pain (Next refill needs appointment. 6 month supply.).  40 tablet  5  . albuterol (PROVENTIL HFA;VENTOLIN HFA) 108 (90 BASE) MCG/ACT inhaler Inhale 2 puffs into the lungs every 6 (six) hours as needed. For shortness of breath/wheezing      . hydrochlorothiazide (MICROZIDE) 12.5 MG capsule Take 12.5 mg by mouth daily before breakfast.        No facility-administered medications prior to visit.    PAST MEDICAL HISTORY: Past Medical History  Diagnosis Date  . COPD (chronic obstructive pulmonary disease)   . Bronchitis     11/2011  . Hypertension goal BP (blood pressure) < 140/90 12/21/2011  . Former smoker   . Shortness of breath     with exertion  . Dizziness   . GERD (gastroesophageal reflux disease)   . Black stools   . Vomiting blood   . Difficulty sleeping   . Neurofibromatosis, type 1     diagnosed 2013    PAST SURGICAL HISTORY: Past Surgical History  Procedure Laterality Date  . None    . No past surgeries    . Esophagogastroduodenoscopy (egd) with propofol  05/14/2012    Procedure: ESOPHAGOGASTRODUODENOSCOPY (EGD) WITH PROPOFOL;  Surgeon: Willis Modena, MD;  Location: [facility] ENDOSCOPY;  Service: Endoscopy;  Laterality: N/A;    FAMILY HISTORY: Family History  Problem Relation Age of Onset  . Diabetes Mother     and father  . Hypertension Mother     and father  .  Neurofibromatosis Brother     deceased  . Hypertension Father   . Diabetes Father   . Neurofibromatosis Daughter   . Neurofibromatosis Daughter     SOCIAL HISTORY:  History   Social History  . Marital Status: Single    Spouse Name: Asher Muir    Number of Children: 2  . Years of Education: 11th   Occupational History  . unemployed    Social History Main Topics  . Smoking status:  Former Smoker -- 0.15 packs/day for 17 years    Types: Cigarettes    Quit date: 12/05/2011  . Smokeless tobacco: Never Used  . Alcohol Use: No     Comment: quit: 05/2012  . Drug Use: No  . Sexually Active: Not on file   Other Topics Concern  . Not on file   Social History Narrative   Unemployed. Recent patient of Quitman Livings, MD of [facility] transferred to our practice after being in jail and losing insurance.       Lives in his parents home with his wife and 2 kids. Completed some High School.    Best contact [phone] and ok to leave a message.       Quit smoking early 7/13. Quit ETOH abuse early 203 after being told he had an upset stomach lining.       Started back smoking 05/2012. 3 cigarettes daily      Caffeine use: very little     PHYSICAL EXAM  Filed Vitals:   10/01/12 1512  BP: 111/68  Pulse: 86  Temp: 97.9 F (36.6 C)  TempSrc: Oral  Height: 5\' 9"  (1.753 m)  Weight: 175 lb (79.379 kg)   Body mass index is 25.83 kg/(m^2).  EXAM: General: Patient is awake, alert and in no acute distress.  Well developed and groomed. Neck: Neck is supple. Cardiovascular: No carotid artery bruits.  Heart is regular rate and rhythm with no murmurs. Skin: NUMEROUS CAFE AU LAIT SPOTS AND CUTANEOUS FIBROMAS.  Neurologic Exam  Mental Status: Awake, alert. Language is fluent and comprehension intact. Cranial Nerves: No evidence of papilledema on funduscopic exam.  Pupils are equal and reactive to light.  Visual fields are full to confrontation.  Conjugate eye movements are full and symmetric.  Facial sensation and strength are symmetric.  Hearing is intact.  Palate elevated symmetrically and uvula is midline.  Shoulder shrug is symmetric.  Tongue is midline. Motor: Normal bulk and tone.  Full strength in the upper and lower extremities.  No pronator drift. Mild decr left finger abd strength. Sensory: Intact and symmetric to light touch, pinprick, temperature,  vibration. Coordination: No ataxia or dysmetria on finger-nose or rapid alternating movement testing. Gait and Station: Narrow based gait, able to walk on heels and toes.  Tandem gait is stable.  Romberg is negative. Reflexes: Deep tendon reflexes in the upper and lower extremity are present and symmetric.   DIAGNOSTIC DATA (LABS, IMAGING, TESTING) - I reviewed patient records, labs, notes, testing and imaging myself where available.  Lab Results  Component Value Date   WBC 8.5 05/11/2012   HGB 15.4 05/11/2012   HCT 43.9 05/11/2012   MCV 88.9 05/11/2012   PLT 204 05/11/2012      Component Value Date/Time   NA 136 05/11/2012 1643   K 3.6 05/11/2012 1643   CL 99 05/11/2012 1643   CO2 25 05/11/2012 1643   GLUCOSE 76 05/11/2012 1643   BUN 8 05/11/2012 1643   CREATININE 0.96 05/11/2012  1643   CREATININE 0.89 05/05/2012 1536   CALCIUM 9.5 05/11/2012 1643   PROT 7.6 05/11/2012 1643   ALBUMIN 4.1 05/11/2012 1643   AST 20 05/11/2012 1643   ALT 16 05/11/2012 1643   ALKPHOS 98 05/11/2012 1643   BILITOT 0.4 05/11/2012 1643   GFRNONAA >90 05/11/2012 1643   GFRAA >90 05/11/2012 1643   Lab Results  Component Value Date   CHOL 161 12/21/2011   HDL 39* 12/21/2011   LDLCALC 108* 12/21/2011   TRIG 69 12/21/2011   CHOLHDL 4.1 12/21/2011   No results found for this basename: HGBA1C   No results found for this basename: VITAMINB12   Lab Results  Component Value Date   TSH 0.520 12/21/2011    01/24/12 MRI thoracic spine - Findings compatible with neurofibroma on the left involving T2 and T3 nerve roots. Extrapleural mass is stable. There is a mild amount of tumor extending into the canal on the left at T2-3 without any cord compression. There is no significant change from prior study on 10/29/11.   ASSESSMENT AND PLAN  37 y.o. year old male  has a past medical history of COPD (chronic obstructive pulmonary disease); Bronchitis; Hypertension goal BP (blood pressure) < 140/90 (12/21/2011);  Former smoker; Shortness of breath; Dizziness; GERD (gastroesophageal reflux disease); Black stools; Vomiting blood; Difficulty sleeping; and Neurofibromatosis, type 1. here with NF1 follow up. Now with new onset headaches and left retro-orbital eye pain since past one week. I will check MRI of the brain and orbits to evaluate for left optic nerve glioma or other intracranial abnormalities.   Orders Placed This Encounter  Procedures  . MR Brain W Wo Contrast  . MR Orbits WO/W Cm     Suanne Marker, MD 10/01/2012, 3:39 PM Certified in Neurology, Neurophysiology and Neuroimaging  [facility] [address] [place], [place] [address] [phone]

## 2012-10-01 NOTE — Progress Notes (Signed)
Subjective:   # Headache middle of forehead to both left and right does not radiate. Moderate-severe in intensity. Pounding sensation. Lasts for 1 hour. Occurs once a day around noon. Been going on for 1 week. Relieved by tylenol #3 and does not come back for a day. Not becoming more severe. Associated with sleep deprivation-children with colds over last week and up at night. No weakness or numbness in extremities or face. No blurry vision. No fever/chills/nausea/vomiting. No caffeine intake. Stops him from playing with his kids when it happens. No unilateral lacrimation  # Neurofibromatosis Lesion on right chest and right arm have approximately doubled in size over the last few months. Moderately more tender. Just wanted to have them checked out. No fever/redness around areas/drainage. Not taking tylenol #3 more regularly and doesn't need refills.   # Hypertension  BP Readings from Last 3 Encounters:  10/01/12 127/85  07/31/12 133/82  05/14/12 137/79   Home BP monitoring-no Compliant with medications-yes without side effects Denies any CP,  SOB, blurry vision, LE edema, transient weakness, orthopnea, PND. Headache as above  ROS--See HPI  Past Medical History-neurofibromatosis, hypertension, hypertension Reviewed problem list.  Medications- reviewed and updated Chief complaint-noted  Objective: BP 127/85  Pulse 94  Temp(Src) 98 F (36.7 C) (Oral)  Ht 5\' 6"  (1.676 m)  Wt 175 lb (79.379 kg)  BMI 28.26 kg/m2 Gen: NAD, resting comfortably on table CV: RRR no murmurs rubs or gallops HEENT: no sinus tenderness Lungs: CTAB no crackles, wheeze, rhonchi Skin: warm, dry, 2cm  Neurofibroma noted on right chest and right upper arm with no surrounding erythema. Mildly tender to palpation.  Neuro: CN II-XII intact, sensation and reflexes normal throughout, 5/5 muscle strength in bilateral upper and lower extremities. Normal finger to nose. Normal rapid alternating movements. Neck supple and no  meningeal signs.   Assessment/Plan: # Headache- no focal neurological deficits or meningeal signs. Only features of migraine are pounding and disabling. No history of headaches. Likely due to sleep deprivation. Follow up in 2 weeks if stable and not improving. Follow up sooner if red flags. Ibuprofen can be used for pain.

## 2012-10-01 NOTE — Patient Instructions (Signed)
I think your headache is related to lack of sleep with your kids being sick recently. Try ibuprofen 400mg  as needed. Try to get more rest at night and nap in the day if needed. If the headaches are not improving, see me back in 2 weeks. If they get worse, see [place] in about a week or if you develop any of the things we discussed.   For your skin lesions, it is normal for them to get larger and since they are related to nerves they can be painful. Let us know if you need any more pain medication.   Headache Headaches are caused by many different problems. Most commonly, headache is caused by muscle tension from an injury, fatigue, or emotional upset. Excessive muscle contractions in the scalp and neck result in a headache that often feels like a tight band around the head. Tension headaches often have areas of tenderness over the scalp and the back of the neck. These headaches may last for hours, days, or longer, and some may contribute to migraines in those who have migraine problems. Migraines usually cause a throbbing headache, which is made worse by activity. Sometimes only one side of the head hurts. Nausea, vomiting, eye pain, and avoidance of food are common with migraines. Visual symptoms such as light sensitivity, blind spots, or flashing lights may also occur. Loud noises may worsen migraine headaches. Many factors may cause migraine headaches:  Emotional stress, lack of sleep, and menstrual periods.   Alcohol and some drugs (such as birth control pills).   Diet factors (fasting, caffeine, food preservatives, chocolate).   Environmental factors (weather changes, bright lights, odors, smoke).  Other causes of headaches include minor injuries to the head. Arthritis in the neck; problems with the jaw, eyes, ears, or nose are also causes of headaches. Allergies, drugs, alcohol, and exposure to smoke can also cause moderate headaches. Rebound headaches can occur if someone uses pain medications for a  long period of time and then stops. Less commonly, blood vessel problems in the neck and brain (including stroke) can cause various types of headache. Treatment of headaches includes medicines for pain and relaxation. Ice packs or heat applied to the back of the head and neck help some people. Massaging the shoulders, neck and scalp are often very useful. Relaxation techniques and stretching can help prevent these headaches. Avoid alcohol and cigarette smoking as these tend to make headaches worse. Please see your caregiver if your headache is not better in 2 days.  SEEK IMMEDIATE MEDICAL CARE IF:   You develop a high fever, chills, or repeated vomiting.   You faint or have difficulty with vision.   You develop unusual numbness or weakness of your arms or legs.   Relief of pain is inadequate with medication, or you develop severe pain.   You develop confusion, or neck stiffness.   You have a worsening of a headache or do not obtain relief.  Document Released: 05/14/2005 Document Revised: 05/03/2011 Document Reviewed: 11/07/2006 [facility] Patient Information 2012 [place], [place].

## 2012-10-01 NOTE — Assessment & Plan Note (Signed)
Growth of lesion noted on right chest. Discussed normal progression of disease. Patient can call if needs refill on meds. Stable pain medicine wise.

## 2012-10-01 NOTE — Patient Instructions (Signed)
I will check MRI brain.  

## 2012-10-01 NOTE — Assessment & Plan Note (Signed)
Well controlled on current medications. Continue meds. Follow up yearly at least.

## 2012-10-06 ENCOUNTER — Ambulatory Visit
Admission: RE | Admit: 2012-10-06 | Discharge: 2012-10-06 | Disposition: A | Discharge status: Home / Self Care | Payer: Medicaid Other | Source: Ambulatory Visit | Attending: Diagnostic Neuroimaging | Admitting: Diagnostic Neuroimaging

## 2012-10-06 DIAGNOSIS — R51 Headache: Secondary | ICD-10-CM

## 2012-10-06 DIAGNOSIS — Q8501 Neurofibromatosis, type 1: Secondary | ICD-10-CM

## 2012-10-06 DIAGNOSIS — H5712 Ocular pain, left eye: Secondary | ICD-10-CM

## 2012-10-06 DIAGNOSIS — H5710 Ocular pain, unspecified eye: Secondary | ICD-10-CM

## 2012-10-06 MED ORDER — GADOBENATE DIMEGLUMINE 529 MG/ML IV SOLN
15.0000 mL | Freq: Once | INTRAVENOUS | Status: AC | PRN
  Administered 2012-10-06: 15 mL via INTRAVENOUS

## 2012-10-07 ENCOUNTER — Telehealth: Payer: Self-pay | Admitting: Diagnostic Neuroimaging

## 2012-10-07 NOTE — Telephone Encounter (Signed)
Called patient and  let him know his scans where in process and once they have been read someone will call him with results. Patient showed understanding.

## 2012-10-08 ENCOUNTER — Other Ambulatory Visit: Payer: Medicaid Other

## 2012-10-10 ENCOUNTER — Other Ambulatory Visit: Payer: Medicaid Other

## 2012-10-10 ENCOUNTER — Encounter: Payer: Self-pay | Admitting: *Deleted

## 2012-10-10 NOTE — Telephone Encounter (Signed)
Patient need mri results 

## 2012-10-12 ENCOUNTER — Other Ambulatory Visit: Payer: Self-pay | Admitting: Family Medicine

## 2012-10-13 ENCOUNTER — Telehealth: Payer: Self-pay

## 2012-10-13 NOTE — Telephone Encounter (Signed)
This encounter was created in error - please disregard.

## 2012-10-13 NOTE — Telephone Encounter (Signed)
Spoked to pt, gave MRI results.

## 2012-10-13 NOTE — Telephone Encounter (Signed)
Message copied by Doree Barthel on Mon Oct 13, 2012  9:24 AM ------      Message from: Joycelyn Schmid      Created: Fri Oct 10, 2012  5:28 PM       Call pt with normal MRI brain and orbits.             ----- Message -----         From: Suanne Marker, MD         Sent: 10/09/2012   1:32 PM           To: Suanne Marker, MD                   ------

## 2012-10-23 ENCOUNTER — Encounter (HOSPITAL_COMMUNITY): Payer: Self-pay | Admitting: *Deleted

## 2012-10-23 ENCOUNTER — Emergency Department (HOSPITAL_COMMUNITY)
Admission: EM | Admit: 2012-10-23 | Discharge: 2012-10-24 | Disposition: A | Discharge status: Home / Self Care | Payer: Medicaid Other | Attending: Emergency Medicine | Admitting: Emergency Medicine

## 2012-10-23 DIAGNOSIS — I10 Essential (primary) hypertension: Secondary | ICD-10-CM | POA: Insufficient documentation

## 2012-10-23 DIAGNOSIS — J449 Chronic obstructive pulmonary disease, unspecified: Secondary | ICD-10-CM | POA: Insufficient documentation

## 2012-10-23 DIAGNOSIS — Z8719 Personal history of other diseases of the digestive system: Secondary | ICD-10-CM | POA: Insufficient documentation

## 2012-10-23 DIAGNOSIS — Z8709 Personal history of other diseases of the respiratory system: Secondary | ICD-10-CM | POA: Insufficient documentation

## 2012-10-23 DIAGNOSIS — Z79899 Other long term (current) drug therapy: Secondary | ICD-10-CM | POA: Insufficient documentation

## 2012-10-23 DIAGNOSIS — R093 Abnormal sputum: Secondary | ICD-10-CM | POA: Insufficient documentation

## 2012-10-23 DIAGNOSIS — R042 Hemoptysis: Secondary | ICD-10-CM

## 2012-10-23 DIAGNOSIS — Q8501 Neurofibromatosis, type 1: Secondary | ICD-10-CM | POA: Insufficient documentation

## 2012-10-23 DIAGNOSIS — Z8669 Personal history of other diseases of the nervous system and sense organs: Secondary | ICD-10-CM | POA: Insufficient documentation

## 2012-10-23 DIAGNOSIS — J4489 Other specified chronic obstructive pulmonary disease: Secondary | ICD-10-CM | POA: Insufficient documentation

## 2012-10-23 DIAGNOSIS — Z87891 Personal history of nicotine dependence: Secondary | ICD-10-CM | POA: Insufficient documentation

## 2012-10-23 LAB — CBC WITH DIFFERENTIAL/PLATELET
Basophils Absolute: 0.1 10*3/uL (ref 0.0–0.1)
Basophils Relative: 1 % (ref 0–1)
Hemoglobin: 15.5 g/dL (ref 13.0–17.0)
Lymphocytes Relative: 34 % (ref 12–46)
MCHC: 34.5 g/dL (ref 30.0–36.0)
Neutro Abs: 4.1 10*3/uL (ref 1.7–7.7)
Neutrophils Relative %: 48 % (ref 43–77)
RDW: 11.8 % (ref 11.5–15.5)
WBC: 8.7 10*3/uL (ref 4.0–10.5)

## 2012-10-23 LAB — COMPREHENSIVE METABOLIC PANEL
ALT: 41 U/L (ref 0–53)
AST: 20 U/L (ref 0–37)
Albumin: 4 g/dL (ref 3.5–5.2)
Alkaline Phosphatase: 107 U/L (ref 39–117)
Chloride: 101 mEq/L (ref 96–112)
Potassium: 3.7 mEq/L (ref 3.5–5.1)
Total Bilirubin: 0.3 mg/dL (ref 0.3–1.2)

## 2012-10-23 MED ORDER — FAMOTIDINE IN NACL 20-0.9 MG/50ML-% IV SOLN
20.0000 mg | Freq: Once | INTRAVENOUS | Status: AC
  Administered 2012-10-23: 20 mg via INTRAVENOUS
  Filled 2012-10-23: qty 50

## 2012-10-23 MED ORDER — SODIUM CHLORIDE 0.9 % IV BOLUS (SEPSIS)
1000.0000 mL | Freq: Once | INTRAVENOUS | Status: AC
Start: 2012-10-23 — End: 2012-10-24
  Administered 2012-10-23: 1000 mL via INTRAVENOUS

## 2012-10-23 MED ORDER — ONDANSETRON HCL 4 MG/2ML IJ SOLN
4.0000 mg | Freq: Once | INTRAMUSCULAR | Status: AC
  Administered 2012-10-23: 4 mg via INTRAVENOUS
  Filled 2012-10-23: qty 2

## 2012-10-23 NOTE — ED Notes (Signed)
NURSE FIRST ROUNDS : NURSE UPDATED PT. ON WAIT TIME , EXPLAINED PROCESS AND DELAY , DENIES PAIN AT THIS TIME , RESPIRATIONS UNLABORED .

## 2012-10-23 NOTE — ED Notes (Signed)
EKG given to Dr. Lavella Lemons. Copy placed in pt chart.

## 2012-10-23 NOTE — ED Notes (Signed)
Patient brought by [facility] for vomiting x 1 week.  Patient now reports bright red blood in his emesis.

## 2012-10-24 NOTE — ED Provider Notes (Signed)
History     CSN: [id number]  Arrival date & time 10/23/12  1654   First MD Initiated Contact with Patient 10/23/12 2343      Chief Complaint  Patient presents with  . Emesis    (Consider location/radiation/quality/duration/timing/severity/associated sxs/prior treatment) HPI  Patient is a 37 yo man with COPD, HTN, neurofibromatosis and recently diagnosed hiatal hernia.   Patient presents stating that he is spitting blood x 1 week. He denies hematemesis. Denies hemoptysis. When he wakes up in the morning he has streaks of blood in saliva. This happens to a much lesser extent throughout the day but more after he has been laying supine for a while. No mouth or throat pain.   Patient notes history of similar sx x 1 earlier in 2014. Patient was referred to GI, Dr. Dulce Sellar, by his FP for further evaluation. He reports having had an EGD which was non-diagnostic for source of bloody saliva but notable only for hiatal hernia.   Patient denies oral lesions.   Past Medical History  Diagnosis Date  . COPD (chronic obstructive pulmonary disease)   . Bronchitis     11/2011  . Hypertension goal BP (blood pressure) < 140/90 12/21/2011  . Former smoker   . Shortness of breath     with exertion  . Dizziness   . GERD (gastroesophageal reflux disease)   . Black stools   . Vomiting blood   . Difficulty sleeping   . Neurofibromatosis, type 1     diagnosed 2013    Past Surgical History  Procedure Laterality Date  . None    . No past surgeries    . Esophagogastroduodenoscopy (egd) with propofol  05/14/2012    Procedure: ESOPHAGOGASTRODUODENOSCOPY (EGD) WITH PROPOFOL;  Surgeon: Willis Modena, MD;  Location: [facility];  Service: Endoscopy;  Laterality: N/A;    Family History  Problem Relation Age of Onset  . Diabetes Mother     and father  . Hypertension Mother     and father  . Neurofibromatosis Brother     deceased  . Hypertension Father   . Diabetes Father   . Neurofibromatosis  Daughter   . Neurofibromatosis Daughter     History  Substance Use Topics  . Smoking status: Former Smoker -- 0.15 packs/day for 17 years    Types: Cigarettes    Quit date: 12/05/2011  . Smokeless tobacco: Never Used  . Alcohol Use: No     Comment: quit: 05/2012   .   Review of Systems Gen: no weight loss, fevers, chills, night sweats Eyes: no discharge or drainage, no occular pain or visual changes Nose: no epistaxis or rhinorrhea Mouth: no dental pain, no sore throat him as per history of present illness, otherwise negative, no lesions. Neck: no neck pain Lungs: no SOB, cough, wheezing CV: no chest pain, palpitations, dependent edema or orthopnea Abd: no abdominal pain, nausea, vomiting GU: no dysuria or gross hematuria MSK: no myalgias or arthralgias Neuro: no headache, no focal neurologic deficits Skin: no rash Psyche: negative.  Allergies  Review of patient's allergies indicates no known allergies.  Home Medications   Current Outpatient Rx  Name  Route  Sig  Dispense  Refill  . acetaminophen-codeine (TYLENOL #3) 300-30 MG per tablet   Oral   Take 1 tablet by mouth 2 (two) times daily.         Marland Kitchen albuterol (PROVENTIL HFA;VENTOLIN HFA) 108 (90 BASE) MCG/ACT inhaler   Inhalation   Inhale 2 puffs into the  lungs 2 (two) times daily as needed for wheezing or shortness of breath.          . gabapentin (NEURONTIN) 300 MG capsule   Oral   Take 300 mg by mouth 2 (two) times daily.         . hydrochlorothiazide (MICROZIDE) 12.5 MG capsule   Oral   Take 12.5 mg by mouth daily.           BP 123/72  Pulse 70  Temp(Src) 97.9 F (36.6 C) (Oral)  Resp 14  SpO2 99%  Physical Exam Gen: well developed and well nourished appearing Head: NCAT Eyes: PERL, EOMI Nose: no epistaixis or rhinorrhea Mouth/throat: mucosa is moist and pink no oral or pharyngeal lesions appreciated.  Neck: supple, no stridor no adenopathy and no masses. Lungs: CTA B, no wheezing,  rhonchi or rales Avascular: Regular rate and rhythm, no murmur, extremities appear well perfused Abd: soft, notender, nondistended Back: no ttp, no cva ttp Skin: no rashese, wnl Neuro: CN ii-xii grossly intact, no focal deficits Psyche:  Normal affect, calm cooperative good insight. Psyche; normal affect,  calm and cooperative.   ED Course  Procedures (including critical care time)  Results for orders placed during the hospital encounter of 10/23/12 (from the past 24 hour(s))  CBC WITH DIFFERENTIAL     Status: Abnormal   Collection Time    10/23/12  5:41 PM      Result Value Range   WBC 8.7  4.0 - 10.5 K/uL   RBC 5.15  4.22 - 5.81 MIL/uL   Hemoglobin 15.5  13.0 - 17.0 g/dL   HCT 16.1  09.6 - 04.5 %   MCV 87.2  78.0 - 100.0 fL   MCH 30.1  26.0 - 34.0 pg   MCHC 34.5  30.0 - 36.0 g/dL   RDW 40.9  81.1 - 91.4 %   Platelets 199  150 - 400 K/uL   Neutrophils Relative % 48  43 - 77 %   Neutro Abs 4.1  1.7 - 7.7 K/uL   Lymphocytes Relative 34  12 - 46 %   Lymphs Abs 2.9  0.7 - 4.0 K/uL   Monocytes Relative 7  3 - 12 %   Monocytes Absolute 0.6  0.1 - 1.0 K/uL   Eosinophils Relative 11 (*) 0 - 5 %   Eosinophils Absolute 1.0 (*) 0.0 - 0.7 K/uL   Basophils Relative 1  0 - 1 %   Basophils Absolute 0.1  0.0 - 0.1 K/uL  COMPREHENSIVE METABOLIC PANEL     Status: None   Collection Time    10/23/12  5:41 PM      Result Value Range   Sodium 138  135 - 145 mEq/L   Potassium 3.7  3.5 - 5.1 mEq/L   Chloride 101  96 - 112 mEq/L   CO2 24  19 - 32 mEq/L   Glucose, Bld 91  70 - 99 mg/dL   BUN 7  6 - 23 mg/dL   Creatinine, Ser 7.82  0.50 - 1.35 mg/dL   Calcium 9.4  8.4 - 95.6 mg/dL   Total Protein 7.7  6.0 - 8.3 g/dL   Albumin 4.0  3.5 - 5.2 g/dL   AST 20  0 - 37 U/L   ALT 41  0 - 53 U/L   Alkaline Phosphatase 107  39 - 117 U/L   Total Bilirubin 0.3  0.3 - 1.2 mg/dL   GFR calc non Af Amer >90  >  90 mL/min   GFR calc Af Amer >90  >90 mL/min  LIPASE, BLOOD     Status: None   Collection  Time    10/23/12 11:59 PM      Result Value Range   Lipase 23  11 - 59 U/L  POCT I-STAT TROPONIN I     Status: None   Collection Time    10/24/12 12:04 AM      Result Value Range   Troponin i, poc 0.01  0.00 - 0.08 ng/mL   Comment 3             MDM  Department workup is nondiagnostic for source of patient's bloody sputum. He denies hemoptysis and hematemesis and has a history of similar symptoms x1. His vital signs are within normal limits and his H&H is within normal limits. As noted above, the patient hasn't received extensive evaluation from otolaryngology as well as gastroenterology for similar presentation. I have directed him to followup with his otolaryngologist to discuss the second episode.   The patient states understanding of this plan and is comfortable with plan to discharge home. He agrees to return to the emergency department for significant worsening symptoms or any urgent health concerns.        Brandt Loosen, MD 10/24/12 [phone]

## 2012-11-05 ENCOUNTER — Other Ambulatory Visit: Payer: Self-pay | Admitting: Diagnostic Neuroimaging

## 2012-12-13 ENCOUNTER — Other Ambulatory Visit: Payer: Self-pay | Admitting: Family Medicine

## 2012-12-13 DIAGNOSIS — I10 Essential (primary) hypertension: Secondary | ICD-10-CM

## 2013-01-28 ENCOUNTER — Encounter: Payer: Self-pay | Admitting: Family Medicine

## 2013-01-28 ENCOUNTER — Ambulatory Visit (INDEPENDENT_AMBULATORY_CARE_PROVIDER_SITE_OTHER): Payer: Medicaid Other | Admitting: Family Medicine

## 2013-01-28 VITALS — BP 114/73 | HR 89 | Temp 98.5°F | Ht 69.0 in | Wt 166.0 lb

## 2013-01-28 DIAGNOSIS — I10 Essential (primary) hypertension: Secondary | ICD-10-CM

## 2013-01-28 DIAGNOSIS — Q8501 Neurofibromatosis, type 1: Secondary | ICD-10-CM

## 2013-01-28 DIAGNOSIS — M549 Dorsalgia, unspecified: Secondary | ICD-10-CM

## 2013-01-28 MED ORDER — ALBUTEROL SULFATE HFA 108 (90 BASE) MCG/ACT IN AERS: 2.0000 | INHALATION_SPRAY | Freq: Two times a day (BID) | RESPIRATORY_TRACT | Status: DC | PRN

## 2013-01-28 MED ORDER — OXYCODONE HCL 5 MG PO TABS: 2.5000 mg | ORAL_TABLET | Freq: Four times a day (QID) | ORAL | Status: DC | PRN

## 2013-01-28 NOTE — Progress Notes (Signed)
[facility] Tana Conch, MD Phone: [phone]  Subjective:  Chief complaint-noted  # Back Pain  Patient states states for about a month he has noted worsening back pain starting approximately between the shoulder blades radiating down his back along paraspinous muscles(L>R) without midline tenderness then going down into bilateral legs. Occasionally will feel numb and weak on entire left side. This mainly happens when he is out playing with his kids. Pain typically minimal to moderate but becomes severe (7/10)when walking or playing with kids. Better with laying on his side. No pain with palpation over back and massage does not help. States he feels weak in his legs mainly due to level of pain. Has some tylenol #3 but only gives moderate relief. Taking his gabapentin. Today, patient endorses mild to moderate pain but denies any weakness or numbness or tingling.   Denies saddle anesthesia, fecal or urinary incontinence.   ROS--See HPI  Past Medical History Patient Active Problem List   Diagnosis Date Noted  . Back pain 03/17/2012    Extensive Plexifrom neurofibroma. Seen Neurology Altamese Cabal III, MD 01/21/12 with plan for q6-12 month MRI and gabapentin for pain.   MRI 10/29/11  27 x 37 x 44 mm plexiform neurofibroma scalloping the medial and anterior aspects of the left T1-T4  vertebral bodies. No cord compression or intraspinal mass lesion.  MRI 04/30/12 (stable compared to prior)Findings compatible with neurofibroma on the left involving T2 and  T3 nerve roots. Extrapleural mass is stable. There is a mild  amount of tumor extending into the canal on the left at T2-3  without any cord compression. There is no significant change from  prior study.    . 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 Hypertension goal BP (blood pressure) < 140/90 12/21/2011    Priority: Medium  . Hemoptysis 01/09/2012    Priority:  Low  . Shortness of breath 12/06/2011    Priority: Low  . Oral mucosal lesion 01/01/2012   Reviewed problem list.  Medications- reviewed and updated Current Outpatient Prescriptions on File Prior to Visit  Medication Sig Dispense Refill  . acetaminophen-codeine (TYLENOL #3) 300-30 MG per tablet Take 1 tablet by mouth 2 (two) times daily.      Marland Kitchen albuterol (PROVENTIL HFA;VENTOLIN HFA) 108 (90 BASE) MCG/ACT inhaler Inhale 2 puffs into the lungs 2 (two) times daily as needed for wheezing or shortness of breath.       . gabapentin (NEURONTIN) 300 MG capsule TAKE 1 CAPSULE AT BEDTIME IN CREASE TO 1 CAPSULE 3 TIMES A DAY AS TOLERATED  90 capsule  3  . hydrochlorothiazide (MICROZIDE) 12.5 MG capsule TAKE 1 CAPSULE DAILY.  30 capsule  11   No current facility-administered medications on file prior to visit.    Objective: BP 114/73  Pulse 89  Temp(Src) 98.5 F (36.9 C) (Oral)  Ht 5\' 9"  (1.753 m)  Wt 166 lb (75.297 kg)  BMI 24.5 kg/m2  SpO2 98% Gen: NAD, resting comfortably on table CV: RRR no murmurs rubs or gallops Lungs: CTAB no crackles, wheeze, rhonchi Skin: warm, dry Neuro: grossly normal, moves all extremities, 5/5 strength in upper and lower extremities, intact distal sensation. CN II-XII intact.  MSK: Back - Normal skin, Spine with normal alignment and no deformity.  No tenderness to vertebral process palpation.  Paraspinous muscles are not tender and without spasm (patient states pain is "deeper" than where I am pressing.   Range of  motion is full at neck and lumbar sacral regions. No saddle anesthesia.    Assessment/Plan:  Patient has been worked up for intermittent SOB in past and was unremarkable. Will give albuterol again which seems to help.

## 2013-01-28 NOTE — Assessment & Plan Note (Addendum)
Check bmet then repeat MRI of spine to evaluate plexiform neurofibroma from T1-T4. Would plan on considering neurosurgery referral at this point if any further progression of lesions. Even without progression, may be reasonable to have patient plugged in. Given intermittent symptoms over a month of the numbness/weakness and no weakness or numbness on exam today will not emergently refer.

## 2013-01-28 NOTE — Patient Instructions (Signed)
We are going to get an MRI of your spine again.  We may need to refer you to a neurosurgeon.  Use your tylenol #3 and then use the oxycodone sparingly for pain.  Follow up with me in about 2 weeks.   Dr. Durene Cal  Health Maintenance Due  Topic Date Due  . Influenza Vaccine -you can get this today if you want 12/26/2012

## 2013-01-30 ENCOUNTER — Other Ambulatory Visit: Payer: Medicaid Other

## 2013-01-30 DIAGNOSIS — I10 Essential (primary) hypertension: Secondary | ICD-10-CM

## 2013-01-30 LAB — BASIC METABOLIC PANEL
CO2: 26 mEq/L (ref 19–32)
Glucose, Bld: 85 mg/dL (ref 70–99)
Potassium: 3.7 mEq/L (ref 3.5–5.3)
Sodium: 139 mEq/L (ref 135–145)

## 2013-01-30 NOTE — Progress Notes (Signed)
BMP DONE TODAY [name] 

## 2013-02-04 ENCOUNTER — Ambulatory Visit (HOSPITAL_COMMUNITY)
Admission: RE | Admit: 2013-02-04 | Discharge: 2013-02-04 | Disposition: A | Discharge status: Home / Self Care | Payer: Medicaid Other | Source: Ambulatory Visit | Attending: Family Medicine | Admitting: Family Medicine

## 2013-02-04 DIAGNOSIS — M5124 Other intervertebral disc displacement, thoracic region: Secondary | ICD-10-CM | POA: Insufficient documentation

## 2013-02-04 DIAGNOSIS — D166 Benign neoplasm of vertebral column: Secondary | ICD-10-CM | POA: Insufficient documentation

## 2013-02-04 DIAGNOSIS — Q8501 Neurofibromatosis, type 1: Secondary | ICD-10-CM

## 2013-02-04 DIAGNOSIS — M549 Dorsalgia, unspecified: Secondary | ICD-10-CM

## 2013-02-04 MED ORDER — GADOBENATE DIMEGLUMINE 529 MG/ML IV SOLN
15.0000 mL | Freq: Once | INTRAVENOUS | Status: AC
  Administered 2013-02-04: 15 mL via INTRAVENOUS

## 2013-02-05 ENCOUNTER — Telehealth: Payer: Self-pay | Admitting: Family Medicine

## 2013-02-05 NOTE — Telephone Encounter (Signed)
Patient left message on medical records line wanting someone to call him back regarding the results of the MRI that he had yesterday.

## 2013-02-06 NOTE — Telephone Encounter (Signed)
Please let patient know I will call him on Monday Night or Tuesday morning as I am off site on Monday.   You may let him know that results were essentially unchanged but I will discuss them in detail with him as well as where we go from here.

## 2013-02-09 NOTE — Telephone Encounter (Signed)
Pt informed. James [name]  

## 2013-02-13 ENCOUNTER — Telehealth: Payer: Self-pay | Admitting: Family Medicine

## 2013-02-13 DIAGNOSIS — Q8500 Neurofibromatosis, unspecified: Secondary | ICD-10-CM

## 2013-02-13 DIAGNOSIS — M549 Dorsalgia, unspecified: Secondary | ICD-10-CM

## 2013-02-13 NOTE — Telephone Encounter (Signed)
Will ask blue team to call for neurosurgery referral. Precepted with Dr. Gwendolyn Grant then discussed with patient given erosion of neurofibroma onto spinal column.

## 2013-02-19 ENCOUNTER — Telehealth: Payer: Self-pay | Admitting: *Deleted

## 2013-02-19 ENCOUNTER — Telehealth: Payer: Self-pay | Admitting: Family Medicine

## 2013-02-19 NOTE — Telephone Encounter (Signed)
Thayer Ohm, from [facility] office called regarding the referral for this patient.  She states that Dr. Jeral Fruit does not feel they can offer the patient anything but suggests the Neurofibromatosis Clinic at [facility].  Will fwd this to PCP.  Maddelyn Rocca, Darlyne Russian, CMA

## 2013-02-19 NOTE — Telephone Encounter (Signed)
Pt informed that we sent referral.  They will review and contact either him or [place]. Pt agreeable. Mylen Mangan, Maryjo Rochester

## 2013-02-19 NOTE — Telephone Encounter (Signed)
Baxter Hire, do you know if this is a neurosurgery group at [facility]? The reason I am referring him to neurosurgery is because of the lesion near his spinal cord eroding into the bone.  Was the physician aware that the referral was due to this lesion?  Thanks, Tana Conch

## 2013-02-19 NOTE — Telephone Encounter (Signed)
Pt came by wanting to know about the neurosurgery referral he hasn't heard anything concerning it, Westen states the best number to reach him at is [phone].

## 2013-02-20 ENCOUNTER — Other Ambulatory Visit: Payer: Self-pay | Admitting: Family Medicine

## 2013-02-20 MED ORDER — ACETAMINOPHEN-CODEINE #3 300-30 MG PO TABS: 1.0000 | ORAL_TABLET | Freq: Two times a day (BID) | ORAL | Status: DC

## 2013-02-20 NOTE — Telephone Encounter (Signed)
Rx called in verbally. James [name] [name]  

## 2013-02-20 NOTE — Telephone Encounter (Signed)
Blue team-can you please call this in for me? Thanks!

## 2013-02-20 NOTE — Telephone Encounter (Signed)
Dr. Durene Cal, I did not make the referral, but did see that on the referral the diagnosis was neurofibromatosis.  Is that the diagnosis?  Dioselina Brumbaugh, Darlyne Russian, CMA

## 2013-02-23 NOTE — Telephone Encounter (Signed)
Ellwood Handler,   Yes the diagnosis is neurofibromatosis. Our question is does he need surgery for one of his neurofibromas that is near his spinal cord and presumed to be causing him significant pain. It is eroding into the bone as noted on last MRI. Could you call back and clarify please? My apologies for not making that clear on initial referral.   Thanks! Jeannett Senior

## 2013-02-24 NOTE — Telephone Encounter (Signed)
Phone called made back to [place], at [facility] [phone]).  Message per Dr. Durene Cal (see below) given to [facility].  She will relay and discuss with Dr. Jeral Fruit and call back.  Tarini Carrier, Darlyne Russian, CMA

## 2013-02-26 ENCOUNTER — Encounter: Payer: Self-pay | Admitting: Family Medicine

## 2013-02-26 ENCOUNTER — Ambulatory Visit (INDEPENDENT_AMBULATORY_CARE_PROVIDER_SITE_OTHER): Payer: Medicaid Other | Admitting: Family Medicine

## 2013-02-26 VITALS — BP 121/83 | HR 76 | Temp 98.0°F | Wt 167.0 lb

## 2013-02-26 DIAGNOSIS — Q8501 Neurofibromatosis, type 1: Secondary | ICD-10-CM

## 2013-02-26 DIAGNOSIS — M549 Dorsalgia, unspecified: Secondary | ICD-10-CM

## 2013-02-26 MED ORDER — NORTRIPTYLINE HCL 25 MG PO CAPS: 25.0000 mg | ORAL_CAPSULE | Freq: Every day | ORAL | Status: DC

## 2013-02-26 MED ORDER — OXYCODONE HCL 5 MG PO TABS: 2.5000 mg | ORAL_TABLET | Freq: Four times a day (QID) | ORAL | Status: DC | PRN

## 2013-02-26 NOTE — Assessment & Plan Note (Signed)
See back pain section-pain due to neurofibroma

## 2013-02-26 NOTE — Assessment & Plan Note (Addendum)
Pain likely due to extensive plexiform neurofibroma-though stable on recent MRI.   Pain contract reviewed and signed. UDS ordered and pending results. 2 months of 2.5mg  oxycodone given (#20 of 5mg  pills).   Attempting to see where we are in referral process for surgery.   Started nortriptyline 25mg  at night to add to neuropathic pain relief component with gabapentin.

## 2013-02-26 NOTE — Progress Notes (Signed)
  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 Patient states pain was well controlled in upper back and chest with oxycodone 2.5mg  down to 2/10 from 10/10. He has no weakness when pain is controlled. Pain 8/10 with tylenol #3. Pain along paraspinous muscles L >> R. No midline tenderness. Pain into bilaterally legs but not numbness. Today, patient endorses mild to moderate pain but denies any weakness or numbness or tingling. Can only tolerate 300mg  gabapentin due to nausea.  ROS-Denies saddle anesthesia, fecal or urinary incontinence.   Past Medical History Patient Active Problem List   Diagnosis Date Noted  . Back pain 03/17/2012    Extensive Plexifrom neurofibroma. Seen Neurology Altamese Cabal III, MD 01/21/12 with plan for q6-12 month MRI and gabapentin for pain.   MRI 10/29/11  27 x 37 x 44 mm plexiform neurofibroma scalloping the medial and anterior aspects of the left T1-T4  vertebral bodies. No cord compression or intraspinal mass lesion.  MRI 04/30/12 (stable compared to prior)Findings compatible with neurofibroma on the left involving T2 and  T3 nerve roots. Extrapleural mass is stable. There is a mild  amount of tumor extending into the canal on the left at T2-3  without any cord compression. There is no significant change from  prior study.    . 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 Hypertension goal BP (blood pressure) < 140/90 12/21/2011    Priority: Medium   Reviewed problem list.  Medications- reviewed and updated Current Outpatient Prescriptions on File Prior to Visit  Medication Sig Dispense Refill  . acetaminophen-codeine (TYLENOL #3) 300-30 MG per tablet Take 1 tablet by mouth 2 (two) times daily.  30 tablet  1   . albuterol (PROVENTIL HFA;VENTOLIN HFA) 108 (90 BASE) MCG/ACT inhaler Inhale 2 puffs into the lungs 2 (two) times daily as needed for wheezing or shortness of breath.  1 Inhaler  3  . gabapentin (NEURONTIN) 300 MG capsule TAKE 1 CAPSULE AT BEDTIME IN CREASE TO 1 CAPSULE 3 TIMES A DAY AS TOLERATED  90 capsule  3  . hydrochlorothiazide (MICROZIDE) 12.5 MG capsule TAKE 1 CAPSULE DAILY.  30 capsule  11   No current facility-administered medications on file prior to visit.    Objective: BP 121/83  Pulse 76  Temp(Src) 98 F (36.7 C) (Oral)  Wt 167 lb (75.751 kg)  BMI 24.65 kg/m2 Gen: NAD, resting comfortably on table Neuro: grossly normal, moves all extremities, 5/5 strength in upper and lower extremities, intact distal sensation. CN II-XII intact.  MSK: Back - Normal skin, Spine with normal alignment and no deformity.  No tenderness to vertebral process palpation.  Paraspinous muscles are not tender and without spasm (patient states pain is "deeper" than where I am pressing.   Range of motion is full at neck and lumbar sacral regions. No saddle anesthesia.    Assessment/Plan:

## 2013-02-26 NOTE — Patient Instructions (Signed)
James [name],   We are still trying to get you into neurosurgery. In the meantime, I want to try to control your pain a little better. I want you to take tylenol #3 first then if pain not controlled, I want you to take 1/2 a tab of the oxycodone. i have given you 2 months worth. Thank you for signing the pain contract and doing the drug screen.   I also want you to take the new medicine nortriptyline to see if that helps with the nerve pain.   See me in 2 months or sooner as needed,  Dr. Durene Cal

## 2013-02-27 LAB — DRUG SCR UR, PAIN MGMT, REFLEX CONF
Amphetamine Screen, Ur: NEGATIVE
Benzodiazepines.: NEGATIVE
Marijuana Metabolite: NEGATIVE
Methadone: NEGATIVE

## 2013-02-27 NOTE — Telephone Encounter (Signed)
Thayer Ohm, from Dr. Cassandria Santee office called again today.  Dr. Jeral Fruit reviewed patients chart and MRIs and still states the he does not treat what Dr. Durene Cal needs him seen for.  Dr. Jeral Fruit states that Dr. Durene Cal may call him to discuss.  Will fwd to Dr. Durene Cal.  Waldo Damian, Darlyne Russian, CMA

## 2013-03-02 NOTE — Telephone Encounter (Signed)
Kristen,   Since Dr. Jeral Fruit has reviewed the MRI and does not think he can help patient, we will pursue the alternative he gave [place].   Can you get the # for the neurofibromatosis clinic at [facility]? Can you see if they would be willing to see the patient under the understanding that we are hoping for information on whether it can be removed?  Thanks, Jeannett Senior

## 2013-03-02 NOTE — Telephone Encounter (Signed)
Message left with neurofibromatosis clinic for a call back to discuss this patient's case for a referral.  Called [phone].  Shanese Riemenschneider, Darlyne Russian, CMA

## 2013-03-03 NOTE — Telephone Encounter (Signed)
Great! Can we inform the patient that neurosurgery here is not going to see him but that we are exploring other options? Thanks

## 2013-03-05 NOTE — Telephone Encounter (Signed)
Pt informed. James [name]  

## 2013-03-05 NOTE — Telephone Encounter (Signed)
Great! Can we inform the patient that neurosurgery here is not going to see him but that we are exploring other options? Thanks  

## 2013-03-05 NOTE — Telephone Encounter (Signed)
I called [facility] again at [phone].  Unable to reach a live person, it is an automated system.  I left a message again asking for a call back to speak to [place] or Shanda Bumps regarding referring this patient.  Rhyen Mazariego, Darlyne Russian, CMA

## 2013-03-12 NOTE — Telephone Encounter (Signed)
Dr. Durene Cal, [facility] called back and asked for [place] to fax referral and OV notes to [phone].  I plan on sending OV 02/26/2013, is there anything else I can fax?  Rhaelyn Giron, Darlyne Russian, CMA

## 2013-03-12 NOTE — Telephone Encounter (Signed)
Please fax all MRI's from last 2 years. Also, please fax all neurology office notes (only one in epic is 10/01/12) as well as note from media from 05/06/12 from [facility].   Thanks so much, Jeannett Senior

## 2013-03-13 NOTE — Telephone Encounter (Signed)
Fax sent this morning per MD request.  Radene Ou, CMA

## 2013-03-20 ENCOUNTER — Telehealth: Payer: Self-pay | Admitting: Family Medicine

## 2013-03-20 ENCOUNTER — Other Ambulatory Visit: Payer: Self-pay | Admitting: Diagnostic Neuroimaging

## 2013-03-20 NOTE — Telephone Encounter (Signed)
Pt called about referral to [place] for his back. Please advise

## 2013-03-26 NOTE — Telephone Encounter (Signed)
Referral for neurosurgery and neurology (seeing local neurologist) had previously been placed. I do not know what neurofibromatosis clinic falls under. What referral would you need placed James [name]?

## 2013-03-26 NOTE — Telephone Encounter (Signed)
Lets see what [employer] says.  Kareem Aul, Darlyne Russian, CMA

## 2013-03-26 NOTE — Telephone Encounter (Signed)
Will fwd to [employer], who does all referrals now. Dr. Durene Cal, was a referral placed in EPIC for [facility]? Viyaan Champine, Darlyne Russian, CMA

## 2013-03-27 ENCOUNTER — Encounter: Payer: Self-pay | Admitting: Family Medicine

## 2013-03-27 ENCOUNTER — Ambulatory Visit (INDEPENDENT_AMBULATORY_CARE_PROVIDER_SITE_OTHER): Payer: Medicaid Other | Admitting: Family Medicine

## 2013-03-27 VITALS — BP 128/84 | HR 100 | Temp 98.0°F | Ht 69.0 in | Wt 169.0 lb

## 2013-03-27 DIAGNOSIS — J069 Acute upper respiratory infection, unspecified: Secondary | ICD-10-CM | POA: Insufficient documentation

## 2013-03-27 MED ORDER — BENZONATATE 200 MG PO CAPS: 200.0000 mg | ORAL_CAPSULE | Freq: Two times a day (BID) | ORAL | Status: DC | PRN

## 2013-03-27 MED ORDER — CETIRIZINE HCL 10 MG PO TABS: 10.0000 mg | ORAL_TABLET | Freq: Every day | ORAL | Status: DC

## 2013-03-27 NOTE — Patient Instructions (Signed)

## 2013-03-27 NOTE — Progress Notes (Signed)
Patient ID: TAISHAUN LEVELS, male   DOB: [birth year], 37 y.o.   MRN: [id number]  [facility] Amber M. Hairford, MD Phone: [phone]   Subjective: HPI: Patient is a 37 y.o. male presenting to clinic today for same day appointment for cough.  Cough Patient complains of productive cough with sputum described as green. Symptoms began 2 weeks ago. Symptoms have been unchanged since that time.The cough is painful and is aggravated by reclining position. Associated symptoms include: postnasal drip and sputum production. Patient does not have new pets. Patient does not have a history of asthma. Patient does not have a history of environmental allergens. Patient has not traveled recently. Patient does have a history of smoking. Patient has not had a previous chest x-ray. Tried Mucinex which did not help. Also has an inhaler at home for dyspnea which does help some. States his kids have been sick with a could.  History Reviewed: Daily smoker. Health Maintenance: UTD on flu shot  ROS: Please see HPI above.  Objective: Office vital signs reviewed. BP 128/84  Pulse 100  Temp(Src) 98 F (36.7 C) (Oral)  Ht 5\' 9"  (1.753 m)  Wt 169 lb (76.658 kg)  BMI 24.95 kg/m2  Physical Examination:  General: Awake, alert. NAD.  HEENT: Atraumatic, normocephalic. MMM. Posterior pharynx erythematous without edema or exudate. Some cobblestoning. Pulm: CTAB, no wheezes. No respiratory distress Cardio: RRR, no murmurs appreciated Abdomen:+BS, soft, nontender, nondistended Extremities: No edema Neuro: Grossly intact  Assessment: 37 y.o. male with URI with cough  Plan: See Problem List and After Visit Summary

## 2013-03-27 NOTE — Assessment & Plan Note (Signed)
Patient with URI with associated cough due to post nasal drip. Lung exam wnl, no wheezes appreciated. No sings of bacterial infection on exam. Given Tessalon to take prn for cough. Use inhaler if needed. Con't supportive care with rest, decongestants and rest. Push fluids. F/u if he worsens or fails to improve.

## 2013-03-30 NOTE — Telephone Encounter (Signed)
Placed phone call to [facility], had to leave message, requesting the status of this patient's referral since information was faxed on 02/19/2013.   Nemiah Kissner, Darlyne Russian, CMA

## 2013-04-03 NOTE — Telephone Encounter (Signed)
Received call from [facility], patient has appointment scheduled for 06/25/13, patient aware. FYI to MD

## 2013-05-06 ENCOUNTER — Ambulatory Visit: Payer: Medicaid Other | Admitting: Diagnostic Neuroimaging

## 2013-06-12 ENCOUNTER — Other Ambulatory Visit: Payer: Self-pay | Admitting: Family Medicine

## 2013-06-12 MED ORDER — ACETAMINOPHEN-CODEINE #3 300-30 MG PO TABS: 1.0000 | ORAL_TABLET | Freq: Two times a day (BID) | ORAL | Status: DC

## 2013-06-17 ENCOUNTER — Encounter: Payer: Self-pay | Admitting: Family Medicine

## 2013-06-17 ENCOUNTER — Ambulatory Visit (INDEPENDENT_AMBULATORY_CARE_PROVIDER_SITE_OTHER): Payer: Medicaid Other | Admitting: Family Medicine

## 2013-06-17 VITALS — BP 126/88 | HR 87 | Temp 98.6°F | Wt 178.0 lb

## 2013-06-17 DIAGNOSIS — M549 Dorsalgia, unspecified: Secondary | ICD-10-CM

## 2013-06-17 MED ORDER — NORTRIPTYLINE HCL 25 MG PO CAPS: 25.0000 mg | ORAL_CAPSULE | Freq: Every day | ORAL | Status: DC

## 2013-06-17 MED ORDER — OXYCODONE HCL 5 MG PO TABS
2.5000 mg | ORAL_TABLET | Freq: Four times a day (QID) | ORAL | Status: DC | PRN
Start: 2013-06-17 — End: 2013-06-17

## 2013-06-17 MED ORDER — OXYCODONE HCL 5 MG PO TABS: 2.5000 mg | ORAL_TABLET | Freq: Four times a day (QID) | ORAL | Status: DC | PRN

## 2013-06-17 NOTE — Patient Instructions (Signed)
I am sorry back pain is continuing. I hope we find some answers through the [facility]. Keep me in the loop and ask them to send records to [place] here.

## 2013-06-17 NOTE — Assessment & Plan Note (Signed)
Once again pain likely due to extensive plexiform neurofibroma. Has appointment with [facility] next week. Hopefully we will find some answers (? Of surgery but neurosurgery in town said not appropriate for local referral). We refilled 2 months of 2.5 mg oxycodone (#20 of 5mg  pills). Restart nortriptyline and consider titration up next visit.

## 2013-06-17 NOTE — Progress Notes (Signed)
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 Patient is using tylenol #3 for baseline pain and oxycodone 2.5mg  for breakthrough pain. Allows him to play with his kids and to sleep. Tried nortriptyline but not sure why he stopped-thinks this helped pain some as well and helped him get to sleep. Gabapentin causes nausea so he only takes 300mg  and wouldn't mind stopping. Pain L >R in upper back radiating down to thoracic area. No midline tenderness. Pain down to 2/10 with oxycodone and tylenol #3 combined.   ROS-Denies saddle anesthesia, fecal or urinary incontinence.  denies any weakness or numbness or tingling. Pain occasionally will shoot down into legs but rare.   Past Medical History Patient Active Problem List   Diagnosis Date Noted  . Back pain 03/17/2012    Priority: High  . 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 Hypertension goal BP (blood pressure) < 140/90 12/21/2011    Priority: Medium  . Hemoptysis 01/09/2012    Priority: Low  . Shortness of breath 12/06/2011    Priority: Low  . Oral mucosal lesion 01/01/2012    Medications- reviewed and updated Current Outpatient Prescriptions  Medication Sig Dispense Refill  . acetaminophen-codeine (TYLENOL #3) 300-30 MG per tablet Take 1 tablet by mouth 2 (two) times daily.  30 tablet  2  . albuterol (PROVENTIL HFA;VENTOLIN HFA) 108 (90 BASE) MCG/ACT inhaler Inhale 2 puffs into the lungs 2 (two) times daily as needed for wheezing or shortness of breath.  1 Inhaler  3  . benzonatate (TESSALON) 200 MG capsule Take 1 capsule (200 mg total) by mouth 2 (two) times daily as needed for cough.  20 capsule  0  . cetirizine (ZYRTEC) 10 MG tablet Take 1 tablet (10 mg total) by mouth daily.  30 tablet  11  . hydrochlorothiazide  (MICROZIDE) 12.5 MG capsule TAKE 1 CAPSULE DAILY.  30 capsule  11  . nortriptyline (PAMELOR) 25 MG capsule Take 1 capsule (25 mg total) by mouth at bedtime.  30 capsule  2  . oxyCODONE (OXY IR/ROXICODONE) 5 MG immediate release tablet Take 0.5 tablets (2.5 mg total) by mouth every 6 (six) hours as needed. Do not fill before 07/18/13  20 tablet  0   No current facility-administered medications for this visit.    Objective: BP 126/88  Pulse 87  Temp(Src) 98.6 F (37 C) (Oral)  Wt 178 lb (80.74 kg) Gen: NAD, resting comfortably on table, pain with palpation of back  Neuro: grossly normal, moves all extremities, 5/5 strength in upper and lower extremities, intact distal sensation. MSK: Back - Normal skin, Spine with normal alignment and no deformity.  No tenderness to vertebral process palpation.  Paraspinous muscles are tender in upper back but without spasm. Range of motion is full at neck and lumbar sacral regions. No saddle anesthesia.   Assessment/Plan:  Back pain Once again pain likely due to extensive plexiform neurofibroma. Has appointment with [facility] next week. Hopefully we will find some answers (? Of surgery but neurosurgery in town said not appropriate for local referral). We refilled 2 months of 2.5 mg oxycodone (#20 of 5mg  pills). Restart nortriptyline and consider titration up next visit.    Meds ordered this encounter  Medications  . nortriptyline (PAMELOR) 25 MG capsule    Sig: Take 1 capsule (25  mg total) by mouth at bedtime.    Dispense:  30 capsule    Refill:  2  . DISCONTD: oxyCODONE (OXY IR/ROXICODONE) 5 MG immediate release tablet    Sig: Take 0.5 tablets (2.5 mg total) by mouth every 6 (six) hours as needed. Do not fill before 06/17/13    Dispense:  20 tablet    Refill:  0  . oxyCODONE (OXY IR/ROXICODONE) 5 MG immediate release tablet    Sig: Take 0.5 tablets (2.5 mg total) by mouth every 6 (six) hours as needed. Do not fill before 07/18/13    Dispense:  20 tablet     Refill:  0

## 2013-06-25 ENCOUNTER — Telehealth: Payer: Self-pay | Admitting: Family Medicine

## 2013-06-25 DIAGNOSIS — E559 Vitamin D deficiency, unspecified: Secondary | ICD-10-CM | POA: Insufficient documentation

## 2013-06-25 NOTE — Telephone Encounter (Signed)
LM for pt to call back.  I need to know the name of the office he is going to and possibly a number to call so I can get their fax.  Dr. Yong Channel thinks he is seeing someone at [facility], but we need to be clear. Jazmin Hartsell,CMA

## 2013-06-25 NOTE — Telephone Encounter (Signed)
Pt had an appointment in [facility] but they said that they needed his last MRI to be sent there. jw

## 2013-06-26 NOTE — Telephone Encounter (Signed)
Forward to [place] she take care of this case going back to Nov.  [employer]

## 2013-06-29 NOTE — Telephone Encounter (Signed)
Pt was referred to [facility] [phone].  All records faxed back in November.  I will refax last few MRIs to fax # [phone] today.  Warwick Nick, [name], [place]

## 2013-06-30 ENCOUNTER — Telehealth: Payer: Self-pay | Admitting: *Deleted

## 2013-06-30 ENCOUNTER — Telehealth: Payer: Self-pay | Admitting: Family Medicine

## 2013-06-30 NOTE — Telephone Encounter (Signed)
Pt called because he was told by [facility] that his vitamin D was low and that it was 10. He wanted to know what he should take to get this up to where it should be. jw

## 2013-06-30 NOTE — Telephone Encounter (Signed)
Ask patient to have them fax [place] the lab and I will be happy to help with treatment.

## 2013-06-30 NOTE — Telephone Encounter (Signed)
Pt is fine with this and will have them fax it over. Jazmin Hartsell,CMA

## 2013-06-30 NOTE — Telephone Encounter (Signed)
Fax received from April at [facility] acknowledging records received that I faxed.  Also, patient did NOT show up for his appt on 06/25/2013.  Appt rescheduled for 12/24/2013.  [name], [name], [place]

## 2013-07-03 ENCOUNTER — Telehealth: Payer: Self-pay | Admitting: Family Medicine

## 2013-07-03 NOTE — Telephone Encounter (Signed)
I did not fax images of MRI's, only results.  Vaughan Garfinkle, [name], [place]

## 2013-07-03 NOTE — Telephone Encounter (Signed)
Pt called because [facility] needs the images of his xrays sent. jw

## 2013-07-03 NOTE — Telephone Encounter (Signed)
Did these get faxed with patient's MRI images?  James [name],CMA

## 2013-07-07 NOTE — Telephone Encounter (Signed)
Pt is aware that he will need to contact [facility] radiology to get a copy of his actual MRI on a disc.  Cherrish Vitali,CMA

## 2013-07-15 ENCOUNTER — Ambulatory Visit: Payer: Medicaid Other

## 2013-08-20 ENCOUNTER — Encounter: Payer: Self-pay | Admitting: Family Medicine

## 2013-08-20 ENCOUNTER — Ambulatory Visit (INDEPENDENT_AMBULATORY_CARE_PROVIDER_SITE_OTHER): Payer: Medicaid Other | Admitting: Family Medicine

## 2013-08-20 VITALS — BP 118/88 | HR 98 | Temp 98.1°F | Wt 178.0 lb

## 2013-08-20 DIAGNOSIS — F17200 Nicotine dependence, unspecified, uncomplicated: Secondary | ICD-10-CM

## 2013-08-20 DIAGNOSIS — Z720 Tobacco use: Secondary | ICD-10-CM

## 2013-08-20 DIAGNOSIS — I10 Essential (primary) hypertension: Secondary | ICD-10-CM

## 2013-08-20 DIAGNOSIS — Q8501 Neurofibromatosis, type 1: Secondary | ICD-10-CM

## 2013-08-20 DIAGNOSIS — M549 Dorsalgia, unspecified: Secondary | ICD-10-CM

## 2013-08-20 DIAGNOSIS — E559 Vitamin D deficiency, unspecified: Secondary | ICD-10-CM

## 2013-08-20 MED ORDER — NICOTINE 7 MG/24HR TD PT24: 7.0000 mg | MEDICATED_PATCH | Freq: Every day | TRANSDERMAL | Status: DC

## 2013-08-20 MED ORDER — GABAPENTIN 100 MG PO CAPS: ORAL_CAPSULE | ORAL | Status: DC

## 2013-08-20 MED ORDER — OXYCODONE HCL 5 MG PO TABS: 2.5000 mg | ORAL_TABLET | Freq: Four times a day (QID) | ORAL | Status: DC | PRN

## 2013-08-20 MED ORDER — NICOTINE 14 MG/24HR TD PT24: 14.0000 mg | MEDICATED_PATCH | Freq: Every day | TRANSDERMAL | Status: DC

## 2013-08-20 MED ORDER — ACETAMINOPHEN-CODEINE #3 300-30 MG PO TABS: 1.0000 | ORAL_TABLET | Freq: Two times a day (BID) | ORAL | Status: DC

## 2013-08-20 MED ORDER — NICOTINE POLACRILEX 2 MG MT GUM: 2.0000 mg | CHEWING_GUM | OROMUCOSAL | Status: DC | PRN

## 2013-08-20 NOTE — Patient Instructions (Addendum)
For your labs, I will send you a letter if there are no medication changes needed. I will call you if we need to discuss your lab results.  Orders Placed This Encounter  Procedures  . Vit D  25 hydroxy (rtn osteoporosis monitoring)   Tobacco abuse  Use 14 mg patch for 3 weeks, place on the skin for the whole day  Then use 7mg  patch for 3 weeks  While using both, you can use gum as needed  Congratulations on taking the first step to quitting again!   Pain control for neurofibroma on spine  Given 1/2 the amount of oxycodone, no further refills as suggested by the neurofibromatosis specialist  Continue Tylenol #3 as needed  We will reevaluate the need for tylenol #3 in 6-8 weeks (schedule appointment)  For your gabapentin:  Day 1-5: Take 1 pill at night Day 6-10: Take 2 pills at night Day 11-15: Take 3 pills at night Day 16-20: Take 1 pill in the morning, 3 pills at night Day 21-25: Take 1 pill in the morning, 1 at lunch, 3 pills at night Day 26-30: Take 2 pills in the morning, 1 at lunch, 3 pills at night Day 31-35: Take 2 pills in the morning, 2 pills at lunch, 3 pills at night Day 36-40: Take 3 pills in the morning, 2 pills at lunch, 3 pills at night Day 41 and on: Take 3 pills in the morning, 3 at lunch, 3 at night  If you are feeling groggy like you did last time, go back 1 step and restart

## 2013-08-20 NOTE — Progress Notes (Signed)
Garret Reddish, MD Phone: [phone]  Subjective:  Chief complaint-noted  James [name] is a 38 y.o. year old very pleasant male patient who presents with the following:  Neurofibromatosis with scalloping lesion along spine History of low vitamin D treated at neurofibromatosis clinic.  Seen by neurofibromatosis clinic in January. Was told that it would not be wise at this time to pursue removal of neurofibroma of spine but suggested continued q6 month follow up (this is per patient report, From records, do not see that he is nonsurgical candidate). Patient continues to have left sided back pain (also with some on R but L>R) which makes it difficult to enjoy life or playing with kids. Sometimes feels weak in left legs but usually not in left hand though will feel numbness/tingling into left leg. Was told he needed neuropathic pain control (gabapentin) over narcotics and was asked to work on wean with me, PCP. Also history of low vitamin D but previously treated, was told he needed follow up testing. He is planning on pursuing disability and will send me forms. No falls since last year.  ROS- no fecal or urinary incontinence. No progressive weakness (stable and intermittent in left leg).   Tobacco abuse Started smoking again 1/2 PPD. Would like to quit using patches ROS-occasional shortness of breath uses albuterol. No current chest pain.   Hypertension BP Readings from Last 3 Encounters:  08/20/13 118/88  06/17/13 126/88  03/27/13 128/84  Home BP monitoring-no Compliant with medications-yes without side effects ROS-Denies any blurry vision, LE edema, transient weakness, orthopnea, PND.   Past Medical History- Patient Active Problem List   Diagnosis Date Noted  . Back pain 03/17/2012    Priority: High  . 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 Hypertension goal BP (blood pressure) < 140/90 12/21/2011    Priority: Medium  .  Hemoptysis 01/09/2012    Priority: Low  . Shortness of breath 12/06/2011    Priority: Low  . Oral mucosal lesion 01/01/2012   Medications- reviewed and updated Current Outpatient Prescriptions  Medication Sig Dispense Refill  . acetaminophen-codeine (TYLENOL #3) 300-30 MG per tablet Take 1 tablet by mouth 2 (two) times daily.  30 tablet  2  . albuterol (PROVENTIL HFA;VENTOLIN HFA) 108 (90 BASE) MCG/ACT inhaler Inhale 2 puffs into the lungs 2 (two) times daily as needed for wheezing or shortness of breath.  1 Inhaler  3  . cetirizine (ZYRTEC) 10 MG tablet Take 1 tablet (10 mg total) by mouth daily.  30 tablet  11  . hydrochlorothiazide (MICROZIDE) 12.5 MG capsule TAKE 1 CAPSULE DAILY.  30 capsule  11  . nortriptyline (PAMELOR) 25 MG capsule Take 1 capsule (25 mg total) by mouth at bedtime.  30 capsule  2  . oxyCODONE (OXY IR/ROXICODONE) 5 MG immediate release tablet Take 0.5 tablets (2.5 mg total) by mouth every 6 (six) hours as needed. Do not fill before 07/18/13  20 tablet  0   No current facility-administered medications for this visit.    Objective: BP 118/88  Pulse 98  Temp(Src) 98.1 F (36.7 C) (Oral)  Wt 178 lb (80.74 kg) Gen: NAD, resting comfortably on table CV: RRR no murmurs rubs or gallops Lungs: CTAB no crackles, wheeze, rhonchi  Back - Normal skin, Spine with normal alignment and no deformity.  No tenderness to vertebral process palpation.  Paraspinous muscles are not tender and without spasm.   Range of motion is full at neck and  lumbar sacral regions. Patient states pain is "deeper" Abdomen: soft/nontender/nondistended/normal bowel sounds. No rebound or guarding.  Ext: no edema Skin: warm, dry Neuro: grossly normal, moves all extremities, 5/5 strength lower and upper extremities today  Assessment/Plan:  Neurofibromatosis, type 1 (von Recklinghausen's disease) Back pain due to neurofibroma. See below.   Back pain Per Dr. Kathrynn Running of neurofibromatosis clinic,  plan to titrate up to 2700mg  gabapentin daily. If no pain relief, switch to pregabalin or cymbalta. Needs to come off of oxycodone per notes, so have given only #10 at this time with no further refills. Refilled tylenol #3 but plan will be to discontinue as titrate up gabapentin. Rx given for gabapentin with titration schedule per AVS to get up to 900 mg daily. He will see me in 6 weeks and we will change to 300mg  pills hopefully and titrate up to 900 mg TID.   Reviewed care everywhere and do not see that patient was told nonsurgical candidate but instead that needed disk which patient has told me he has picked up. Will look forward to future communications. At next appointment, may need to ask [facility] if they want [place] to repeat imaging or defer to their office.   Vitamin D deficiency In January 2015 level at 10. Repeat today at 78. Will monitor q6 to 12 months.   Tobacco abuse Recurrence noted when previously quit. At 1/2 PPD. See AVS for instructions on patch and gum use to help with cessation.   Hypertension goal BP (blood pressure) < 140/90 Well controlled on manual recheck. Continue HCTZ.     Orders Placed This Encounter  Procedures  . Vit D  25 hydroxy (rtn osteoporosis monitoring)    Meds ordered this encounter  Medications  . oxyCODONE (OXY IR/ROXICODONE) 5 MG immediate release tablet    Sig: Take 0.5 tablets (2.5 mg total) by mouth every 6 (six) hours as needed. Do not fill before 07/18/13    Dispense:  10 tablet    Refill:  0  . acetaminophen-codeine (TYLENOL #3) 300-30 MG per tablet    Sig: Take 1 tablet by mouth 2 (two) times daily.    Dispense:  30 tablet    Refill:  1  . gabapentin (NEURONTIN) 100 MG capsule    Sig: Use taper given.    Dispense:  90 capsule    Refill:  3  . nicotine ([employer] NICOTINE TRANSDERMAL SYS) 14 mg/24hr patch    Sig: Place 1 patch (14 mg total) onto the skin daily.    Dispense:  21 patch    Refill:  0  . nicotine ([employer] NICOTINE) 7 mg/24hr patch     Sig: Place 1 patch (7 mg total) onto the skin daily.    Dispense:  21 patch    Refill:  0  . nicotine polacrilex ([employer] NICOTINE POLACRILEX) 2 MG gum    Sig: Take 1 each (2 mg total) by mouth as needed for smoking cessation.    Dispense:  100 tablet    Refill:  2

## 2013-08-21 ENCOUNTER — Encounter: Payer: Self-pay | Admitting: Family Medicine

## 2013-08-21 LAB — VITAMIN D 25 HYDROXY (VIT D DEFICIENCY, FRACTURES): VIT D 25 HYDROXY: 39 ng/mL (ref 30–89)

## 2013-08-21 NOTE — Assessment & Plan Note (Signed)
Well controlled on manual recheck. Continue HCTZ.

## 2013-08-21 NOTE — Assessment & Plan Note (Signed)
Back pain due to neurofibroma. See below.

## 2013-08-21 NOTE — Assessment & Plan Note (Signed)
Recurrence noted when previously quit. At 1/2 PPD. See AVS for instructions on patch and gum use to help with cessation.

## 2013-08-21 NOTE — Assessment & Plan Note (Signed)
In January 2015 level at 10. Repeat today at 59. Will monitor q6 to 12 months.

## 2013-08-21 NOTE — Assessment & Plan Note (Signed)
Per Dr. Kathrynn Running of neurofibromatosis clinic, plan to titrate up to 2700mg  gabapentin daily. If no pain relief, switch to pregabalin or cymbalta. Needs to come off of oxycodone per notes, so have given only #10 at this time with no further refills. Refilled tylenol #3 but plan will be to discontinue as titrate up gabapentin. Rx given for gabapentin with titration schedule per AVS to get up to 900 mg daily. He will see me in 6 weeks and we will change to 300mg  pills hopefully and titrate up to 900 mg TID.   Reviewed care everywhere and do not see that patient was told nonsurgical candidate but instead that needed disk which patient has told me he has picked up. Will look forward to future communications. At next appointment, may need to ask [facility] if they want [place] to repeat imaging or defer to their office.

## 2013-10-05 ENCOUNTER — Encounter: Payer: Self-pay | Admitting: Family Medicine

## 2013-10-05 ENCOUNTER — Ambulatory Visit (INDEPENDENT_AMBULATORY_CARE_PROVIDER_SITE_OTHER): Payer: Medicaid Other | Admitting: Family Medicine

## 2013-10-05 VITALS — BP 136/92 | HR 79 | Temp 98.3°F | Wt 173.0 lb

## 2013-10-05 DIAGNOSIS — F17200 Nicotine dependence, unspecified, uncomplicated: Secondary | ICD-10-CM

## 2013-10-05 DIAGNOSIS — Z720 Tobacco use: Secondary | ICD-10-CM

## 2013-10-05 DIAGNOSIS — Q8501 Neurofibromatosis, type 1: Secondary | ICD-10-CM

## 2013-10-05 MED ORDER — NICOTINE POLACRILEX 2 MG MT GUM: 2.0000 mg | CHEWING_GUM | OROMUCOSAL | Status: DC | PRN

## 2013-10-05 MED ORDER — NICOTINE 14 MG/24HR TD PT24
14.0000 mg | MEDICATED_PATCH | Freq: Every day | TRANSDERMAL | Status: DC
Start: 2013-10-05 — End: 2014-09-17

## 2013-10-05 MED ORDER — ACETAMINOPHEN-CODEINE #3 300-30 MG PO TABS: 1.0000 | ORAL_TABLET | Freq: Three times a day (TID) | ORAL | Status: DC | PRN

## 2013-10-05 MED ORDER — NICOTINE 7 MG/24HR TD PT24: 7.0000 mg | MEDICATED_PATCH | Freq: Every day | TRANSDERMAL | Status: DC

## 2013-10-05 NOTE — Patient Instructions (Addendum)
Tobacco abuse  Use 14 mg patch for 3 weeks, place on the skin for the whole day  Then use 7mg  patch for 3 weeks  While using both, you can use gum as needed (4 pieces max per day)  Congratulations on taking the first step to quitting again!   Pain control for neurofibroma on spine  Continue Tylenol #3 as needed, hopeful to come off of this  Need to get gabapentin to the point you don't need tylenol #3  For your gabapentin:  Day 1-5: Take 1 pill in the morning, 3 pills at night Day 6-10: Take 1 pill in the morning, 1 at lunch, 3 pills at night Day 11-15: Take 2 pills in the morning, 1 at lunch, 3 pills at night Day 15-20: Take 2 pills in the morning, 2 pills at lunch, 3 pills at night Day 21-25: Take 3 pills in the morning, 2 pills at lunch, 3 pills at night Day 26 and on: Take 3 pills in the morning, 3 at lunch, 3 at night  If you are feeling groggy like you did last time, go back 1 step and restart

## 2013-10-05 NOTE — Assessment & Plan Note (Signed)
COunseled on need for cessation. HOpeful the way i wrote nicotine replacement will be covered. Still 1/2 PPD. See AVS for plans for quitting.

## 2013-10-05 NOTE — Progress Notes (Signed)
Garret Reddish, MD Phone: [phone]  Subjective:  Chief complaint-noted  James [name] is a 38 y.o. year old very pleasant male patient who presents with the following:  Neurofibromatosis with scalloping lesion along spine Has not been able to tolerate increase in gabapentin although does not appear to have tried more than once to go beyond 300mg  nightly. Patient states pain is much improved on gabapentin. States now 5/10 in left side of back. Denies any weakness in leg. Most of his pain is with bending over. He is now off oxycodone completely and feels like he is using tylenol #3 less.  Has follow up in July with neurofibromatosis clinic at [facility]. Denies recent ROS- no fecal or urinary incontinence. No progressive weakness (stable and intermittent in left leg).   Tobacco abuse COuldnt afford nicotine gum or patches at 14 mg (not clear why as should be covered by Medicaid. Still smoking1/2 PPD. Still wants to quit ROS-occasional shortness of breath uses albuterol. No current chest pain.   Past Medical History- Patient Active Problem List   Diagnosis Date Noted  . Back pain 03/17/2012    Priority: High  . 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 Hypertension goal BP (blood pressure) < 140/90 12/21/2011    Priority: Medium  . Hemoptysis 01/09/2012    Priority: Low  . Shortness of breath 12/06/2011    Priority: Low  . Vitamin D deficiency 08/21/2013  . Oral mucosal lesion 01/01/2012   Medications- reviewed and updated Current Outpatient Prescriptions  Medication Sig Dispense Refill  . acetaminophen-codeine (TYLENOL #3) 300-30 MG per tablet Take 1 tablet by mouth every 8 (eight) hours as needed for moderate pain.  30 tablet  1  . cetirizine (ZYRTEC) 10 MG tablet Take 1 tablet (10 mg total) by mouth daily.  30 tablet  11  . gabapentin (NEURONTIN) 100 MG capsule Use taper given.  90 capsule  3  . hydrochlorothiazide (MICROZIDE) 12.5  MG capsule TAKE 1 CAPSULE DAILY.  30 capsule  11  . albuterol (PROVENTIL HFA;VENTOLIN HFA) 108 (90 BASE) MCG/ACT inhaler Inhale 2 puffs into the lungs 2 (two) times daily as needed for wheezing or shortness of breath.  1 Inhaler  3  . nicotine (NICODERM CQ - DOSED IN MG/24 HOURS) 14 mg/24hr patch Place 1 patch (14 mg total) onto the skin daily. Generic version-nicoderm not medicaid preferred.  28 patch  0  . nicotine (NICODERM CQ - DOSED IN MG/24 HR) 7 mg/24hr patch Place 1 patch (7 mg total) onto the skin daily. Generic version-nicoderm not medicaid preferred.  28 patch  0  . nicotine polacrilex (NICORETTE) 2 MG gum Take 1 each (2 mg total) by mouth as needed for smoking cessation.  100 tablet  1   No current facility-administered medications for this visit.    Objective: BP 136/92  Pulse 79  Temp(Src) 98.3 F (36.8 C) (Oral)  Wt 173 lb (78.472 kg) Gen: NAD, resting comfortably on table CV: RRR no murmurs rubs or gallops Lungs: CTAB no crackles, wheeze, rhonchi  Back - Normal skin, Spine with normal alignment and no deformity.  No tenderness to vertebral process palpation.  Paraspinous muscles are not tender and without spasm.   Range of motion is full at neck and lumbar sacral regions. Patient states pain is "deeper". Pain with bending over noted.  Ext: no edema Neuro: grossly normal, moves all extremities, 5/5 strength lower and upper extremities  Assessment/Plan:  Neurofibromatosis, type 1 (  von Recklinghausen's disease) Pain much improved on gabapentin 300 mg at night. Encouraged patient to continue to work on titration schedule but he is fearful of groggy feeling. Could consider cymbalta or Lyrica if cannot titrate up after another trial. Patient is seeking disability due to the lesion. I expressed today that my goal would be to titrate up gabapentin to get him off of tylenol #3. Given his pain is much better controlled, I am hopeful patient will be able to work again at least at a  desk. He no longer has any lower extremity weakness which is an improvement. His lawyer is to contact me with any information needed.   Tobacco abuse COunseled on need for cessation. HOpeful the way i wrote nicotine replacement will be covered. Still 1/2 PPD. See AVS for plans for quitting.    Meds ordered this encounter  Medications  . nicotine (NICODERM CQ - DOSED IN MG/24 HOURS) 14 mg/24hr patch    Sig: Place 1 patch (14 mg total) onto the skin daily. Generic version-nicoderm not medicaid preferred.    Dispense:  28 patch    Refill:  0  . nicotine (NICODERM CQ - DOSED IN MG/24 HR) 7 mg/24hr patch    Sig: Place 1 patch (7 mg total) onto the skin daily. Generic version-nicoderm not medicaid preferred.    Dispense:  28 patch    Refill:  0  . nicotine polacrilex (NICORETTE) 2 MG gum    Sig: Take 1 each (2 mg total) by mouth as needed for smoking cessation.    Dispense:  100 tablet    Refill:  1  . acetaminophen-codeine (TYLENOL #3) 300-30 MG per tablet    Sig: Take 1 tablet by mouth every 8 (eight) hours as needed for moderate pain.    Dispense:  30 tablet    Refill:  1

## 2013-10-05 NOTE — Assessment & Plan Note (Signed)
Pain much improved on gabapentin 300 mg at night. Encouraged patient to continue to work on titration schedule but he is fearful of groggy feeling. Could consider cymbalta or Lyrica if cannot titrate up after another trial. Patient is seeking disability due to the lesion. I expressed today that my goal would be to titrate up gabapentin to get him off of tylenol #3. Given his pain is much better controlled, I am hopeful patient will be able to work again at least at a desk. He no longer has any lower extremity weakness which is an improvement. His lawyer is to contact me with any information needed.

## 2013-10-22 ENCOUNTER — Encounter: Payer: Self-pay | Admitting: Family Medicine

## 2013-12-18 ENCOUNTER — Other Ambulatory Visit: Payer: Self-pay | Admitting: Family Medicine

## 2014-04-13 ENCOUNTER — Ambulatory Visit (INDEPENDENT_AMBULATORY_CARE_PROVIDER_SITE_OTHER): Payer: Medicaid Other | Admitting: Family Medicine

## 2014-04-13 VITALS — BP 142/88 | HR 123 | Temp 98.0°F | Wt 163.0 lb

## 2014-04-13 DIAGNOSIS — I10 Essential (primary) hypertension: Secondary | ICD-10-CM

## 2014-04-13 DIAGNOSIS — D492 Neoplasm of unspecified behavior of bone, soft tissue, and skin: Secondary | ICD-10-CM

## 2014-04-13 DIAGNOSIS — Q8501 Neurofibromatosis, type 1: Secondary | ICD-10-CM

## 2014-04-13 DIAGNOSIS — M546 Pain in thoracic spine: Secondary | ICD-10-CM

## 2014-04-13 LAB — BASIC METABOLIC PANEL
BUN: 14 mg/dL (ref 6–23)
CALCIUM: 9.7 mg/dL (ref 8.4–10.5)
CO2: 28 meq/L (ref 19–32)
Chloride: 104 mEq/L (ref 96–112)
Creat: 1.05 mg/dL (ref 0.50–1.35)
GLUCOSE: 69 mg/dL — AB (ref 70–99)
POTASSIUM: 4.3 meq/L (ref 3.5–5.3)
Sodium: 140 mEq/L (ref 135–145)

## 2014-04-13 MED ORDER — ACETAMINOPHEN-CODEINE #3 300-30 MG PO TABS: 1.0000 | ORAL_TABLET | Freq: Three times a day (TID) | ORAL | Status: DC | PRN

## 2014-04-13 NOTE — Assessment & Plan Note (Signed)
Patient complaining of significant back pain for the past 48 hours. Patient has a significant history for neurofibromatosis type II. Patient is followed by [facility] dermatology. 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 According to the records as able to observe he has MRIs dating from 2013 which clearly state the evidence of these lesions. It may be beneficial for patient to sit down with his primary care provider to discuss these lesions and their implications.  - today I refilled patient's Tylenol 3 prescription. According to records patient had had his last refill back in May of this year. This loose patient with a total of 30 tablets for the past 6 months. I believe patient has been compliant and responsible with this medication.

## 2014-04-13 NOTE — Progress Notes (Signed)
HPI  Patient presents today for increased back pain 2 days. Patient states that he has had increased back pain localized primarily to his upper thoracic spine, midline for the past 2 days. This pain does not radiate. He denies any injury to this area. He denies any weakness/numbness in any of his 4 extremities. Patient states that this pain is chronic in nature secondary to lesions he has developed from his neurofibromatosis type I. Patient has a history significant for T2 and T3 bony lesions.   Patient's chronic back pain has been well-controlled with Tylenol 3. Agent states that he has run out of this medication. Records show that his last refill was in May of this year.  Patient denies any further symptoms. He denies any numbness weakness or paresthesias. He denies bladder or bowel incontinence.  Smoking status noted ROS: Per HPI  Objective: BP 142/88 mmHg  Pulse 123  Temp(Src) 98 F (36.7 C) (Oral)  Wt 163 lb (73.936 kg) Gen: NAD, alert, cooperative with exam HEENT: NCAT, EOMI, PERRL CV: RRR, good S1/S2, no murmur Resp: CTABL, no wheezes, non-labored Abd: SNTND, BS present, no guarding or organomegaly Back: point tenderness noted primarily on T2 and T3 vertebral spines. Ext: No edema, warm Neuro: Alert and oriented. 5/5 strength bilaterally in all extremities, +2 reflexes bilaterally, CN II through XII intact. Sensation intact bilaterally. Patient's balance was borderline. Patient had issues standing on his tiptoes, and his heels. Patient had no issues with Romberg maneuver. This suggests possible strength deficit (although no strength deficit was initially appreciated).  Assessment and plan:  Back pain Patient complaining of significant back pain for the past 48 hours. Patient has a significant history for neurofibromatosis type II. Patient is followed by [facility] dermatology. According to the patient he was recently informed that he has thoracic spine tumors at the level of T2 and  T3. According to the records as able to observe he has MRIs dating from 2013 which clearly state the evidence of these lesions. It may be beneficial for patient to sit down with his primary care provider to discuss these lesions and their implications.  - today I refilled patient's Tylenol 3 prescription. According to records patient had had his last refill back in May of this year. This loose patient with a total of 30 tablets for the past 6 months. I believe patient has been compliant and responsible with this medication.  Thoracic spine tumor Lesions noted at level of T2 and T3. 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 According to the patient he had not been informed of the actual diagnosis of his thoracic spine tumors until he was informed by his dermatologist by [facility] earlier this month. I believe that it is imperative that he sit down with his primary care physician to discuss these tumors and their implications.  - I've ordered a MRI thoracic spine with and without contrast. - I would like patient to follow-up with his primary care physician after the results of this MRI have been obtained.    Orders Placed This Encounter  Procedures  . MR Thoracic Spine W Wo Contrast    Standing Status: Future     Number of Occurrences:      Standing Expiration Date: 06/14/2015    Order Specific Question:  GRA to provide read?    Answer:  Yes    Order Specific Question:  Reason for Exam (SYMPTOM  OR DIAGNOSIS REQUIRED)    Answer:  neurofibromatosis type I; T2-T3 lesions    Order Specific Question:  Preferred imaging location?    Answer:  [facility]    Order Specific Question:  Does the patient have a pacemaker or implanted devices?    Answer:  No    Order Specific Question:  What is the patient's sedation requirement?    Answer:  No Sedation  . Basic Metabolic Panel    Meds ordered this encounter  Medications  .  acetaminophen-codeine (TYLENOL #3) 300-30 MG per tablet    Sig: Take 1 tablet by mouth every 8 (eight) hours as needed for moderate pain.    Dispense:  30 tablet    Refill:  1     Elberta Leatherwood, MD,MS,  PGY1 04/13/2014 12:41 PM

## 2014-04-13 NOTE — Patient Instructions (Signed)
It was a pleasure seeing you today in our clinic. Today we discussed thoracic back pain. Here is the treatment plan we have discussed and agreed upon together:   - I have refilled your prescription for Tylenol 3's. Please take this in moderation to help make pain more bearable, as they are not to diminish the pain entirely. - I have ordered to have a repeat MRI of your thoracic spine performed at [facility]. Please called have this appointment set up as these can be time consuming. - I would like to have you follow-up with Dr. [name] soon after the completion of your MRI. I believe that it is extremely important to have you followed by him as soon as possible so he is aware of your current medical state.

## 2014-04-13 NOTE — Assessment & Plan Note (Addendum)
Lesions noted at level of T2 and T3. 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 According to the patient he had not been informed of the actual diagnosis of his thoracic spine tumors until he was informed by his dermatologist by [facility] earlier this month. I believe that it is imperative that he sit down with his primary care physician to discuss these tumors and their implications.  - I've ordered a MRI thoracic spine with and without contrast. - I would like patient to follow-up with his primary care physician after the results of this MRI have been obtained.

## 2014-04-26 ENCOUNTER — Ambulatory Visit (HOSPITAL_COMMUNITY)
Admission: RE | Admit: 2014-04-26 | Discharge: 2014-04-26 | Disposition: A | Discharge status: Home / Self Care | Payer: Medicaid Other | Source: Ambulatory Visit | Attending: Family Medicine | Admitting: Family Medicine

## 2014-04-26 DIAGNOSIS — M5124 Other intervertebral disc displacement, thoracic region: Secondary | ICD-10-CM | POA: Diagnosis not present

## 2014-04-26 DIAGNOSIS — Q8501 Neurofibromatosis, type 1: Secondary | ICD-10-CM | POA: Diagnosis not present

## 2014-04-26 DIAGNOSIS — M40204 Unspecified kyphosis, thoracic region: Secondary | ICD-10-CM | POA: Diagnosis not present

## 2014-04-26 DIAGNOSIS — M5134 Other intervertebral disc degeneration, thoracic region: Secondary | ICD-10-CM | POA: Insufficient documentation

## 2014-04-26 DIAGNOSIS — M549 Dorsalgia, unspecified: Secondary | ICD-10-CM | POA: Diagnosis present

## 2014-04-26 MED ORDER — GADOBENATE DIMEGLUMINE 529 MG/ML IV SOLN
15.0000 mL | Freq: Once | INTRAVENOUS | Status: AC | PRN
  Administered 2014-04-26: 15 mL via INTRAVENOUS

## 2014-05-11 ENCOUNTER — Encounter: Payer: Self-pay | Admitting: Family Medicine

## 2014-05-11 ENCOUNTER — Ambulatory Visit (INDEPENDENT_AMBULATORY_CARE_PROVIDER_SITE_OTHER): Payer: Medicaid Other | Admitting: Family Medicine

## 2014-05-11 ENCOUNTER — Ambulatory Visit (INDEPENDENT_AMBULATORY_CARE_PROVIDER_SITE_OTHER): Payer: Medicaid Other | Admitting: *Deleted

## 2014-05-11 VITALS — BP 146/97 | HR 103 | Temp 98.3°F | Ht 69.0 in | Wt 163.0 lb

## 2014-05-11 DIAGNOSIS — I10 Essential (primary) hypertension: Secondary | ICD-10-CM

## 2014-05-11 DIAGNOSIS — Q8500 Neurofibromatosis, unspecified: Secondary | ICD-10-CM

## 2014-05-11 DIAGNOSIS — F17210 Nicotine dependence, cigarettes, uncomplicated: Secondary | ICD-10-CM | POA: Insufficient documentation

## 2014-05-11 DIAGNOSIS — Z720 Tobacco use: Secondary | ICD-10-CM

## 2014-05-11 DIAGNOSIS — Z23 Encounter for immunization: Secondary | ICD-10-CM

## 2014-05-11 DIAGNOSIS — Q8501 Neurofibromatosis, type 1: Secondary | ICD-10-CM

## 2014-05-11 DIAGNOSIS — F17200 Nicotine dependence, unspecified, uncomplicated: Secondary | ICD-10-CM | POA: Insufficient documentation

## 2014-05-11 MED ORDER — MELOXICAM 15 MG PO TABS: 15.0000 mg | ORAL_TABLET | Freq: Every day | ORAL | Status: DC

## 2014-05-11 MED ORDER — ALBUTEROL SULFATE HFA 108 (90 BASE) MCG/ACT IN AERS: 2.0000 | INHALATION_SPRAY | Freq: Two times a day (BID) | RESPIRATORY_TRACT | Status: DC | PRN

## 2014-05-11 NOTE — Assessment & Plan Note (Signed)
Mildly elevated pressure today likely secondary to pain. Will not make any changes at this time. Can consider addition of an additional agent if contiues to be hypertensive at follow up appointments. Continue HCTZ.

## 2014-05-11 NOTE — Patient Instructions (Signed)
Thank you for coming to the clinic today. It was nice seeing you.  For your neurofibromatosis, we are getting an MRI of your lower back. We are also starting a new pain medication called Mobin. Take one tablet daily and continue taking the Tylenol #3. If you get sudden weakness, bowel or bladder incontinence or numbness in your inner legs, please seek medical attention immediately.   See you again in 3-6 months.

## 2014-05-11 NOTE — Progress Notes (Signed)
James [name] is a 38 y.o. male who presents to the [facility] today with to meet his new PCP and discuss his chronic back pain. 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 Patient reports that he recently saw a spine specialist at [facility] to discuss potential surgery, however it was deemed that he was a poor surgical candidate given the size of the lesions and proximity of the lesions to his lungs. He is currently scheduled to see the NF1 clinic at [facility] within the next month. Patient is currently on tylenol #3 which helps moderately with his pain. Has tried tramadol and gabapentin in the past without much relief.   Back and Leg pain Patient complains today of new, worsening, bilateral leg pain for the past 4 days. Pain is described as sharp and shooting from his low back along the lateral aspects of his thighs. No burning or electrical sensations. He endorses some weakness when walking. No bowel or bladder incontinence or saddle anesthesia.   Hypertension BP Readings from Last 3 Encounters:  05/11/14 146/97  04/13/14 142/88  10/05/13 136/92   Home BP monitoring-no Compliant with medications-yes without side effects ROS-Denies any CP, HA, SOB, blurry vision, LE edema, transient weakness, orthopnea, PND.    Tobacco Abuse. Current smoker. Around 5-6 cigarettes per day.   ROS: As per HPI, otherwise all systems reviewed and are negative.  Past Medical History - Reviewed and updated Patient Active Problem List   Diagnosis Date Noted  . Tobacco abuse 05/11/2014  . 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 Vitamin D deficiency 08/21/2013  . Back pain 03/17/2012  . Hemoptysis 01/09/2012  . Oral mucosal lesion 01/01/2012  . Hypertension 12/21/2011  . 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 Shortness of breath 12/06/2011    Medications- reviewed  and updated Current Outpatient Prescriptions  Medication Sig Dispense Refill  . acetaminophen-codeine (TYLENOL #3) 300-30 MG per tablet Take 1 tablet by mouth every 8 (eight) hours as needed for moderate pain. 30 tablet 1  . albuterol (PROVENTIL HFA;VENTOLIN HFA) 108 (90 BASE) MCG/ACT inhaler Inhale 2 puffs into the lungs 2 (two) times daily as needed for wheezing or shortness of breath. 1 Inhaler 3  . cetirizine (ZYRTEC) 10 MG tablet Take 1 tablet (10 mg total) by mouth daily. 30 tablet 11  . gabapentin (NEURONTIN) 100 MG capsule Use taper given. 90 capsule 3  . hydrochlorothiazide (MICROZIDE) 12.5 MG capsule TAKE 1 CAPSULE DAILY. 30 capsule 11  . meloxicam (MOBIC) 15 MG tablet Take 1 tablet (15 mg total) by mouth daily. 30 tablet 0  . nicotine (NICODERM CQ - DOSED IN MG/24 HOURS) 14 mg/24hr patch Place 1 patch (14 mg total) onto the skin daily. Generic version-nicoderm not medicaid preferred. 28 patch 0  . nicotine (NICODERM CQ - DOSED IN MG/24 HR) 7 mg/24hr patch Place 1 patch (7 mg total) onto the skin daily. Generic version-nicoderm not medicaid preferred. 28 patch 0  . nicotine polacrilex (NICORETTE) 2 MG gum Take 1 each (2 mg total) by mouth as needed for smoking cessation. 100 tablet 1   No current facility-administered medications for this visit.    Objective: Physical Exam: BP 146/97 mmHg  Pulse 103  Temp(Src) 98.3 F (36.8 C) (Oral)  Ht 5\' 9"  (1.753 m)  Wt 163 lb (73.936 kg)  BMI 24.06 kg/m2  Gen: NAD, resting comfortably CV: RRR with no murmurs appreciated Lungs: NWOB, CTAB with no crackles, wheezes, or rhonchi Abdomen: Normal bowel sounds present. Soft, Nontender, Nondistended. Ext: Tender to palpation along T spine and L spine without appreciable deformities. No fluctuance.   Skin: warm, dry Neuro: Decreased sensation to light tough along lateral aspects of bilateral thighs. 5/5 strength at hip, knee, and ankle joints bilaterally.  No results found for this or any  previous visit (from the past 72 hour(s)).  A/P: See problem list  Neurofibromatosis, type 1 (von Recklinghausen's disease) Patient will have visit with NF1 clinic at [facility] within the next month. Main symptoms now seem to be his chronic back pain and new leg pain. Given new onset of lumbar and leg pain, will obtain MR of L spine to ensure he does not have additional lesions. No current red flag symptoms. Will additionally give 1 month supply of Mobic for pain.   Hypertension Mildly elevated pressure today likely secondary to pain. Will not make any changes at this time. Can consider addition of an additional agent if contiues to be hypertensive at follow up appointments. Continue HCTZ.   Tobacco abuse Current everyday smoker (6 cigarettes per day). Counseling given. Not ready to quit at this time. Encouraged patient to continue to wean down.     Orders Placed This Encounter  Procedures  . MR Lumbar Spine W Wo Contrast    Standing Status: Future     Number of Occurrences:      Standing Expiration Date: 07/13/2015    Order Specific Question:  Reason for Exam (SYMPTOM  OR DIAGNOSIS REQUIRED)    Answer:  leg pain, h/o NF1    Order Specific Question:  Preferred imaging location?    Answer:  [facility]    Order Specific Question:  Does the patient have a pacemaker or implanted devices?    Answer:  No    Order Specific Question:  What is the patient's sedation requirement?    Answer:  No Sedation  . Basic Metabolic Panel    Standing Status: Future     Number of Occurrences:      Standing Expiration Date: 05/12/2015    Meds ordered this encounter  Medications  . meloxicam (MOBIC) 15 MG tablet    Sig: Take 1 tablet (15 mg total) by mouth daily.    Dispense:  30 tablet    Refill:  0  . albuterol (PROVENTIL HFA;VENTOLIN HFA) 108 (90 BASE) MCG/ACT inhaler    Sig: Inhale 2 puffs into the lungs 2 (two) times daily as needed for wheezing or shortness of breath.    Dispense:  1  Inhaler    Refill:  [place]. [name], [place] Resident PGY-1 05/11/2014 5:21 PM

## 2014-05-11 NOTE — Assessment & Plan Note (Signed)
Current everyday smoker (6 cigarettes per day). Counseling given. Not ready to quit at this time. Encouraged patient to continue to wean down.

## 2014-05-11 NOTE — Assessment & Plan Note (Signed)
Patient will have visit with NF1 clinic at [facility] within the next month. Main symptoms now seem to be his chronic back pain and new leg pain. Given new onset of lumbar and leg pain, will obtain MR of L spine to ensure he does not have additional lesions. No current red flag symptoms. Will additionally give 1 month supply of Mobic for pain.

## 2014-05-31 ENCOUNTER — Ambulatory Visit (HOSPITAL_COMMUNITY)
Admission: RE | Admit: 2014-05-31 | Discharge: 2014-05-31 | Disposition: A | Discharge status: Home / Self Care | Payer: Medicaid Other | Source: Ambulatory Visit | Attending: Family Medicine | Admitting: Family Medicine

## 2014-05-31 DIAGNOSIS — M5418 Radiculopathy, sacral and sacrococcygeal region: Secondary | ICD-10-CM | POA: Insufficient documentation

## 2014-05-31 DIAGNOSIS — M5137 Other intervertebral disc degeneration, lumbosacral region: Secondary | ICD-10-CM | POA: Diagnosis not present

## 2014-05-31 DIAGNOSIS — M899 Disorder of bone, unspecified: Secondary | ICD-10-CM | POA: Diagnosis not present

## 2014-05-31 DIAGNOSIS — Q8500 Neurofibromatosis, unspecified: Secondary | ICD-10-CM

## 2014-05-31 DIAGNOSIS — M5127 Other intervertebral disc displacement, lumbosacral region: Secondary | ICD-10-CM | POA: Insufficient documentation

## 2014-05-31 DIAGNOSIS — M79606 Pain in leg, unspecified: Secondary | ICD-10-CM | POA: Diagnosis present

## 2014-05-31 DIAGNOSIS — Q8501 Neurofibromatosis, type 1: Secondary | ICD-10-CM | POA: Insufficient documentation

## 2014-05-31 LAB — CREATININE, SERUM
CREATININE: 0.96 mg/dL (ref 0.50–1.35)
GFR calc Af Amer: >90 mL/min (ref >90)
GFR calc non Af Amer: >90 mL/min (ref >90)

## 2014-05-31 MED ORDER — GADOBENATE DIMEGLUMINE 529 MG/ML IV SOLN
15.0000 mL | Freq: Once | INTRAVENOUS | Status: AC | PRN
  Administered 2014-05-31: 15 mL via INTRAVENOUS

## 2014-06-07 ENCOUNTER — Telehealth: Payer: Self-pay | Admitting: Family Medicine

## 2014-06-07 ENCOUNTER — Encounter: Payer: Self-pay | Admitting: Family Medicine

## 2014-06-07 NOTE — Telephone Encounter (Signed)
Called patient and informed him of MRI results. Told patient that he did not have any evidence for neurofibromatosis in his lumbar spine, and that he had mild disc degeneration that was likely contributing to his symptoms. Recommended continue current therapy, (Rx for 30 days of mobic given at last appointment), patient already on Tylenol #3. Also recommended heating pad. Patient will meet with neurosurgeon later this week.  [name]. [name], [place] Resident PGY-1 06/07/2014 3:08 PM

## 2014-06-11 ENCOUNTER — Encounter: Payer: Self-pay | Admitting: *Deleted

## 2014-06-11 NOTE — Progress Notes (Signed)
Form faxed from transportation today for medicaid to cover cost when patient travels to [facility] for appointments.  Form placed in provider's box to be completed and faxed back to transportation.  Please give this to [place] when complete.  Thanks [employer]

## 2014-06-14 NOTE — Progress Notes (Signed)
Form filled out and given to [place].   [name]. [name], [place] Resident PGY-1 06/14/2014 2:06 PM

## 2014-07-05 ENCOUNTER — Other Ambulatory Visit: Payer: Self-pay | Admitting: *Deleted

## 2014-07-06 MED ORDER — CETIRIZINE HCL 10 MG PO TABS: 10.0000 mg | ORAL_TABLET | Freq: Every day | ORAL | Status: DC

## 2014-09-17 ENCOUNTER — Ambulatory Visit (INDEPENDENT_AMBULATORY_CARE_PROVIDER_SITE_OTHER): Payer: Medicaid Other | Admitting: Family Medicine

## 2014-09-17 VITALS — BP 121/73 | HR 89 | Temp 98.1°F | Ht 69.0 in | Wt 165.8 lb

## 2014-09-17 DIAGNOSIS — R05 Cough: Secondary | ICD-10-CM | POA: Diagnosis not present

## 2014-09-17 DIAGNOSIS — I10 Essential (primary) hypertension: Secondary | ICD-10-CM | POA: Diagnosis not present

## 2014-09-17 DIAGNOSIS — R059 Cough, unspecified: Secondary | ICD-10-CM

## 2014-09-17 DIAGNOSIS — Z79899 Other long term (current) drug therapy: Secondary | ICD-10-CM

## 2014-09-17 DIAGNOSIS — R053 Chronic cough: Secondary | ICD-10-CM | POA: Insufficient documentation

## 2014-09-17 DIAGNOSIS — Z0000 Encounter for general adult medical examination without abnormal findings: Secondary | ICD-10-CM | POA: Diagnosis not present

## 2014-09-17 DIAGNOSIS — M546 Pain in thoracic spine: Secondary | ICD-10-CM | POA: Diagnosis not present

## 2014-09-17 LAB — CBC
HCT: 49.8 % (ref 39.0–52.0)
Hemoglobin: 16.6 g/dL (ref 13.0–17.0)
MCH: 32 pg (ref 26.0–34.0)
MCHC: 33.3 g/dL (ref 30.0–36.0)
MCV: 96.1 fL (ref 78.0–100.0)
MPV: 11.8 fL (ref 8.6–12.4)
Platelets: 222 10*3/uL (ref 150–400)
RBC: 5.18 MIL/uL (ref 4.22–5.81)
RDW: 12.5 % (ref 11.5–15.5)
WBC: 9.4 10*3/uL (ref 4.0–10.5)

## 2014-09-17 LAB — POCT GLYCOSYLATED HEMOGLOBIN (HGB A1C): Hemoglobin A1C: 4.6

## 2014-09-17 MED ORDER — ACETAMINOPHEN-CODEINE #3 300-30 MG PO TABS: 1.0000 | ORAL_TABLET | Freq: Three times a day (TID) | ORAL | Status: DC | PRN

## 2014-09-17 MED ORDER — ALBUTEROL SULFATE HFA 108 (90 BASE) MCG/ACT IN AERS: 2.0000 | INHALATION_SPRAY | Freq: Two times a day (BID) | RESPIRATORY_TRACT | Status: DC | PRN

## 2014-09-17 MED ORDER — FLUTICASONE PROPIONATE 50 MCG/ACT NA SUSP: 2.0000 | Freq: Every day | NASAL | Status: DC

## 2014-09-17 NOTE — Assessment & Plan Note (Signed)
Appears to be post nasal drip given rhinorrhea, occurrence only when sleeping at night, and inflamed turbinates on exam. Plan to continue zyrtec. Start flonase.

## 2014-09-17 NOTE — Assessment & Plan Note (Signed)
Will check lipid panel and A1c today.

## 2014-09-17 NOTE — Assessment & Plan Note (Addendum)
At baseline. Refilled tylenol #3 today. Instructed patient that he would need a separate visit to discuss chronic pain issues in the future. Low suspicion for misuse - patient has only used 60 tablets in last 5 months.

## 2014-09-17 NOTE — Assessment & Plan Note (Signed)
At goal. Continue HCTZ.  

## 2014-09-17 NOTE — Patient Instructions (Signed)
Thank you for coming to the clinic today. It was nice seeing you.  For your cough and shortness of breath, this is probably due to allergies or a mild cold. Continue taking your Zyrtec. We will start you on a nasal spray. Please use 2 sprays into each nostril once daily  We will refill your pain medication today. In the future, please let the front staff know that you need a refill on pain meds so that they can make a specific appointment for that.  We will also be checking your cholesterol and blood sugar levels today. You will receive a letter soon with the results.  See you again in 3-6 months or sooner if you need anything else.

## 2014-09-17 NOTE — Progress Notes (Signed)
James [name] is a 39 y.o. male who presents to the [facility] today with a chief complaint of cough. His concerns today include:  HPI:  Cough Patient reports SOB and cough lying sleeping for the past 1 week. States that he will wake up at night coughing and feeling short of breath. Has increased sputum production (clear and yellow). Symptoms occur only at night. No PND. No leg edema. Sleeps on 1 pillow. Feels more short of breath with exertion. Endorses rhinorrhea. No fevers or chills. No sick contacts. Takes zyrtec for allergies.   Chronic Pain At baseline. Pain described as sharp and shooting. Secondary to NF1. Will be seeing specialist next month. Tylenol #3 helps with the pain. Rated as 5-6/10 today.   ROS: As per HPI, otherwise all systems reviewed and are negative.  Past Medical History - Reviewed and updated Patient Active Problem List   Diagnosis Date Noted  . Cough 09/17/2014  . Healthcare maintenance 09/17/2014  . Tobacco abuse 05/11/2014  . 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 Vitamin D deficiency 08/21/2013  . Back pain 03/17/2012  . Hemoptysis 01/09/2012  . Oral mucosal lesion 01/01/2012  . Hypertension 12/21/2011  . 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 Shortness of breath 12/06/2011    Medications- reviewed and updated Current Outpatient Prescriptions  Medication Sig Dispense Refill  . acetaminophen-codeine (TYLENOL #3) 300-30 MG per tablet Take 1 tablet by mouth every 8 (eight) hours as needed for moderate pain. 30 tablet 1  . albuterol (PROVENTIL HFA;VENTOLIN HFA) 108 (90 BASE) MCG/ACT inhaler Inhale 2 puffs into the lungs 2 (two) times daily as needed for wheezing or shortness of breath. 1 Inhaler 3  . cetirizine (ZYRTEC) 10 MG tablet Take 1 tablet (10 mg total) by mouth daily. 30 tablet 11  . fluticasone (FLONASE) 50 MCG/ACT nasal spray Place 2 sprays into both nostrils daily. 16 g 2  . hydrochlorothiazide (MICROZIDE) 12.5 MG capsule  TAKE 1 CAPSULE DAILY. 30 capsule 11  . meloxicam (MOBIC) 15 MG tablet Take 1 tablet (15 mg total) by mouth daily. 30 tablet 0   No current facility-administered medications for this visit.    Objective: Physical Exam: BP 121/73 mmHg  Pulse 89  Temp(Src) 98.1 F (36.7 C) (Oral)  Ht 5\' 9"  (1.753 m)  Wt 165 lb 12.8 oz (75.206 kg)  BMI 24.47 kg/m2  Gen: NAD, resting comfortably HEENT: MMM, OP erythematous with no exudates, bilateral turbinates edematous and inflammed CV: RRR with no murmurs appreciated, no orthopnea. No hepatojuglar reflux Lungs: NWOB, CTAB with no crackles, wheezes, or rhonchi Abdomen: Normal bowel sounds present. Soft, Nontender, Nondistended. Ext: no edema Skin: warm, dry Neuro: grossly normal, moves all extremities  Results for orders placed or performed in visit on 09/17/14 (from the past 72 hour(s))  POCT glycosylated hemoglobin (Hb A1C)     Status: None   Collection Time: 09/17/14  4:25 PM  Result Value Ref Range   Hemoglobin A1C 4.6     A/P: See problem list  Cough Appears to be post nasal drip given rhinorrhea, occurrence only when sleeping at night, and inflamed turbinates on exam. Plan to continue zyrtec. Start flonase.    Back pain At baseline. Refilled tylenol #3 today. Instructed patient that he would need a separate visit to discuss chronic pain issues in the future. Low suspicion for misuse - patient has only used 60 tablets in last 5 months.    Healthcare maintenance Will check lipid panel and A1c today.  Hypertension At goal. Continue HCTZ.      Orders Placed This Encounter  Procedures  . Lipid Panel  . CBC  . Basic metabolic panel  . POCT glycosylated hemoglobin (Hb A1C)    Meds ordered this encounter  Medications  . fluticasone (FLONASE) 50 MCG/ACT nasal spray    Sig: Place 2 sprays into both nostrils daily.    Dispense:  16 g    Refill:  2  . albuterol (PROVENTIL HFA;VENTOLIN HFA) 108 (90 BASE) MCG/ACT inhaler     Sig: Inhale 2 puffs into the lungs 2 (two) times daily as needed for wheezing or shortness of breath.    Dispense:  1 Inhaler    Refill:  3  . acetaminophen-codeine (TYLENOL #3) 300-30 MG per tablet    Sig: Take 1 tablet by mouth every 8 (eight) hours as needed for moderate pain.    Dispense:  30 tablet    Refill:  1     Mavrick Mcquigg M. [name], [place] Resident PGY-1 09/17/2014 5:43 PM

## 2014-09-18 ENCOUNTER — Encounter: Payer: Self-pay | Admitting: Family Medicine

## 2014-09-18 LAB — LIPID PANEL
CHOLESTEROL: 146 mg/dL (ref 0–200)
HDL: 43 mg/dL (ref >=40)
LDL Cholesterol: 83 mg/dL (ref 0–99)
Total CHOL/HDL Ratio: 3.4 Ratio
Triglycerides: 102 mg/dL (ref <150)
VLDL: 20 mg/dL (ref 0–40)

## 2014-09-18 LAB — BASIC METABOLIC PANEL
BUN: 12 mg/dL (ref 6–23)
CO2: 23 mEq/L (ref 19–32)
CREATININE: 0.9 mg/dL (ref 0.50–1.35)
Calcium: 9.4 mg/dL (ref 8.4–10.5)
Chloride: 101 mEq/L (ref 96–112)
Glucose, Bld: 77 mg/dL (ref 70–99)
Potassium: 3.8 mEq/L (ref 3.5–5.3)
Sodium: 134 mEq/L — ABNORMAL LOW (ref 135–145)

## 2014-09-18 NOTE — Progress Notes (Signed)
Results reviewed. All wnl. Letter sent.  [name]. [name], [place] Resident PGY-1 09/18/2014 3:33 PM

## 2015-01-14 ENCOUNTER — Other Ambulatory Visit: Payer: Self-pay | Admitting: Family Medicine

## 2015-04-04 ENCOUNTER — Ambulatory Visit: Payer: Medicaid Other | Admitting: Family Medicine

## 2015-04-25 ENCOUNTER — Ambulatory Visit (INDEPENDENT_AMBULATORY_CARE_PROVIDER_SITE_OTHER): Payer: Medicaid Other | Admitting: Family Medicine

## 2015-04-25 ENCOUNTER — Encounter: Payer: Self-pay | Admitting: Family Medicine

## 2015-04-25 VITALS — BP 160/98 | HR 95 | Temp 97.9°F | Wt 156.4 lb

## 2015-04-25 DIAGNOSIS — I10 Essential (primary) hypertension: Secondary | ICD-10-CM

## 2015-04-25 DIAGNOSIS — Z23 Encounter for immunization: Secondary | ICD-10-CM

## 2015-04-25 DIAGNOSIS — L989 Disorder of the skin and subcutaneous tissue, unspecified: Secondary | ICD-10-CM | POA: Diagnosis not present

## 2015-04-25 DIAGNOSIS — M546 Pain in thoracic spine: Secondary | ICD-10-CM

## 2015-04-25 MED ORDER — HYDROCHLOROTHIAZIDE 25 MG PO TABS: 25.0000 mg | ORAL_TABLET | Freq: Every day | ORAL | Status: DC

## 2015-04-25 MED ORDER — ACETAMINOPHEN-CODEINE #3 300-30 MG PO TABS: 1.0000 | ORAL_TABLET | Freq: Three times a day (TID) | ORAL | Status: DC | PRN

## 2015-04-25 NOTE — Patient Instructions (Signed)
Thank you for coming to the clinic today. It was nice seeing you.  We will refill your pain medication today. Please see the NF1 clinic as soon as you can.  The knot on your chest is likely due to your Nf1. I do not think we need to do anything else at this point. If it starts getting worse, please let us know.  We will increase your blood pressure medication today. Please take 2 pills every morning. I will send in a new prescription for 25mg  daily. Please just take 1 pill daily for the new prescription.  Please come back in 3 months for recheck, or sooner if you need anything else.  Take care,  Dr [name]

## 2015-04-25 NOTE — Assessment & Plan Note (Signed)
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 No signs or symptoms of infection. Will watch for now. Instructed patient to return if worsens or becomes symptomatic. Can consider excisional biopsy at future visit.

## 2015-04-25 NOTE — Assessment & Plan Note (Signed)
Elevated today. Patient notes that it has been increased for the past several months. Will increase HCTZ to 25mg  daily.

## 2015-04-25 NOTE — Assessment & Plan Note (Addendum)
At baseline today. Secondary to patient's NF1. No red flag signs or symptoms. Will refill tylenol #3. Will schedule an appointment with Nf1 clinic next month.

## 2015-04-25 NOTE — Progress Notes (Signed)
    Subjective:  James James [name] is a 39 y.o. male who presents to the [facility] today with a chief complaint of chronic James [name] follow up.   HPI:  Chronic James [name] At baseline. James [name] is sharp and shooting. Secondary to James [name]. Tylenol #3 helps with the James [name]. Will follow up at James [name] clinic next month. James [name] rated as 6.5/10 today. No bowel or bladder incontinence. No weakness or numbness. No saddle anesthesia.   Knot on Chest Patient noticed knot on chest for the past 2 days. Hurts to touch, but is otherwise not painful. About the size of pea. Has been getting more painful. No recent trauma.   Hypertension BP Readings from Last 3 Encounters:  04/25/15 142/96  09/17/14 121/73  05/11/14 146/97   Home BP monitoring- Yes, 140s/90s Compliant with medications-yes, without side effects ROS-Denies any CP, HA, SOB, blurry vision, LE edema, transient weakness, orthopnea, PND.   ROS: Per HPI  PMH:  The following were reviewed and entered/updated in epic: Past Medical History  Diagnosis Date  . COPD (chronic obstructive pulmonary disease) ([place])   . Bronchitis     11/2011  . Hypertension goal BP (blood pressure) < 140/90 12/21/2011  . Former smoker   . Shortness of breath     with exertion  . Dizziness   . GERD (gastroesophageal reflux disease)   . Black stools   . Vomiting blood   . Difficulty sleeping   . Neurofibromatosis, type 1 ([place])     diagnosed 2013   Patient Active Problem List   Diagnosis Date Noted  . Skin lesion 04/25/2015  . Cough 09/17/2014  . Healthcare maintenance 09/17/2014  . Tobacco abuse 05/11/2014  . 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 Vitamin D deficiency 08/21/2013  . Back James [name] 03/17/2012  . Hemoptysis 01/09/2012  . Oral mucosal lesion 01/01/2012  . Hypertension 12/21/2011  . Neurofibromatosis, type 1 (von Recklinghausen's disease) ([place]) 12/10/2011  . Shortness of breath 12/06/2011   Past Surgical History  Procedure Laterality Date  . None    . No past surgeries    .  Esophagogastroduodenoscopy (egd) with propofol  05/14/2012    Procedure: ESOPHAGOGASTRODUODENOSCOPY (EGD) WITH PROPOFOL;  Surgeon: Arta Silence, MD;  Location: [facility] ENDOSCOPY;  Service: Endoscopy;  Laterality: N/A;     Objective:  Physical Exam: BP 142/96 mmHg  Pulse 101  Temp(Src) 97.9 F (36.6 C) (Oral)  Wt 156 lb 6.4 oz (70.943 kg)  Gen: NAD, resting comfortably CV: RRR with no murmurs appreciated Lungs: NWOB, CTAB with no crackles, wheezes, or rhonchi GI: Normal bowel sounds present. Soft, Nontender, Nondistended. MSK: no edema, cyanosis, or clubbing noted -Chest: Small 1cm mobile mass noted on anterior aspect of sternum. Nontender. No fluctuance. No erythema Skin: warm, dry, diffuse fibromas noted throughout skin Neuro: grossly normal, moves all extremities Psych: Normal affect and thought content  Assessment/Plan:  Back James [name] At baseline today. Secondary to patient's James [name]. No red flag signs or symptoms. Will refill tylenol #3. Will schedule an appointment with James [name] clinic next month.   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 No signs or symptoms of infection. Will watch for now. Instructed patient to return if worsens or becomes symptomatic. Can consider excisional biopsy at future visit.   Hypertension Elevated today. Patient notes that it has been increased for the past several months. Will increase HCTZ to 25mg  daily.    James James [name]. James James [name], [place] [facility] Resident PGY-2 04/25/2015 5:01 PM

## 2015-07-18 ENCOUNTER — Other Ambulatory Visit: Payer: Self-pay | Admitting: Family Medicine

## 2015-07-19 ENCOUNTER — Emergency Department (HOSPITAL_COMMUNITY)
Admission: EM | Admit: 2015-07-19 | Discharge: 2015-07-19 | Disposition: A | Discharge status: Home / Self Care | Payer: Medicaid Other | Attending: Emergency Medicine | Admitting: Emergency Medicine

## 2015-07-19 ENCOUNTER — Encounter (HOSPITAL_COMMUNITY): Payer: Self-pay | Admitting: *Deleted

## 2015-07-19 DIAGNOSIS — I10 Essential (primary) hypertension: Secondary | ICD-10-CM | POA: Insufficient documentation

## 2015-07-19 DIAGNOSIS — Z8669 Personal history of other diseases of the nervous system and sense organs: Secondary | ICD-10-CM | POA: Insufficient documentation

## 2015-07-19 DIAGNOSIS — Q8501 Neurofibromatosis, type 1: Secondary | ICD-10-CM | POA: Diagnosis not present

## 2015-07-19 DIAGNOSIS — H00013 Hordeolum externum right eye, unspecified eyelid: Secondary | ICD-10-CM | POA: Diagnosis not present

## 2015-07-19 DIAGNOSIS — Z79899 Other long term (current) drug therapy: Secondary | ICD-10-CM | POA: Diagnosis not present

## 2015-07-19 DIAGNOSIS — Z7951 Long term (current) use of inhaled steroids: Secondary | ICD-10-CM | POA: Diagnosis not present

## 2015-07-19 DIAGNOSIS — J449 Chronic obstructive pulmonary disease, unspecified: Secondary | ICD-10-CM | POA: Insufficient documentation

## 2015-07-19 DIAGNOSIS — Z791 Long term (current) use of non-steroidal anti-inflammatories (NSAID): Secondary | ICD-10-CM | POA: Insufficient documentation

## 2015-07-19 DIAGNOSIS — F17210 Nicotine dependence, cigarettes, uncomplicated: Secondary | ICD-10-CM | POA: Diagnosis not present

## 2015-07-19 DIAGNOSIS — Z8719 Personal history of other diseases of the digestive system: Secondary | ICD-10-CM | POA: Diagnosis not present

## 2015-07-19 DIAGNOSIS — H5711 Ocular pain, right eye: Secondary | ICD-10-CM | POA: Diagnosis present

## 2015-07-19 MED ORDER — ERYTHROMYCIN 5 MG/GM OP OINT: TOPICAL_OINTMENT | OPHTHALMIC | Status: DC

## 2015-07-19 MED ORDER — FLUORESCEIN SODIUM 1 MG OP STRP: 1.0000 | ORAL_STRIP | Freq: Once | OPHTHALMIC | Status: DC

## 2015-07-19 MED ORDER — IBUPROFEN 800 MG PO TABS: 800.0000 mg | ORAL_TABLET | Freq: Three times a day (TID) | ORAL | Status: DC

## 2015-07-19 MED ORDER — TETRACAINE HCL 0.5 % OP SOLN: 2.0000 [drp] | Freq: Once | OPHTHALMIC | Status: DC

## 2015-07-19 NOTE — Discharge Instructions (Signed)
You have been seen today for a stye. Use the erythromycin ointment as directed: Apply 4 times a day for the next 5 days. Follow up with PCP as needed for chronic management. Return to ED should symptoms worsen.

## 2015-07-19 NOTE — ED Notes (Signed)
Pt reports a bump that appeared on his rt eye 1 week ago pt stats that it moved down into his eye lashes now. Pt denies drainage or injury.

## 2015-07-19 NOTE — ED Provider Notes (Signed)
CSN: [id number]     Arrival date & time 07/19/15  [id number] History  By signing my name below, I, James [name], attest that this documentation has been prepared under the direction and in the presence of non-physician practitioner, James Hopping, PA-C. Electronically Signed: Evelene [name], Scribe. 07/19/2015. 6:26 PM.    Chief Complaint  Patient presents with  . Eye Pain    The history is provided by the patient. No language interpreter was used.    HPI Comments:  James [name] is a 40 y.o. male with a history of neurofibromatosis who presents to the Emergency Department complaining of gradually worsening pain and "a bump" to his right eyelid x ~ 1 week. He describes 7/10 throbbing pain. No alleviating factors noted. Patient denies fever/chills, changes in vision, eye discharge, or any other complaints.  Past Medical History  Diagnosis Date  . COPD (chronic obstructive pulmonary disease) ([place])   . Bronchitis     11/2011  . Hypertension goal BP (blood pressure) < 140/90 12/21/2011  . Former smoker   . Shortness of breath     with exertion  . Dizziness   . GERD (gastroesophageal reflux disease)   . Black stools   . Vomiting blood   . Difficulty sleeping   . Neurofibromatosis, type 1 ([place])     diagnosed 2013   Past Surgical History  Procedure Laterality Date  . None    . No past surgeries    . Esophagogastroduodenoscopy (egd) with propofol  05/14/2012    Procedure: ESOPHAGOGASTRODUODENOSCOPY (EGD) WITH PROPOFOL;  Surgeon: James Silence, MD;  Location: [facility] ENDOSCOPY;  Service: Endoscopy;  Laterality: N/A;   Family History  Problem Relation Age of Onset  . Diabetes Mother     and father  . Hypertension Mother     and father  . Neurofibromatosis Brother     deceased  . Hypertension Father   . Diabetes Father   . Neurofibromatosis Daughter   . Neurofibromatosis Daughter    Social History  Substance Use Topics  . Smoking status: Current Every Day Smoker -- 0.15 packs/day for 17  years    Types: Cigarettes    Last Attempt to Quit: 12/05/2011  . Smokeless tobacco: Never Used  . Alcohol Use: No     Comment: quit: 05/2012    Review of Systems  Constitutional: Negative for fever and chills.  Eyes: Negative for photophobia, discharge and visual disturbance.       +Eyelid pain and bump    Allergies  Review of patient's allergies indicates no known allergies.  Home Medications   Prior to Admission medications   Medication Sig Start Date End Date Taking? Authorizing Provider  acetaminophen-codeine (TYLENOL #3) 300-30 MG tablet Take 1 tablet by mouth every 8 (eight) hours as needed for moderate pain. 04/25/15   James Barrack, MD  albuterol (PROVENTIL HFA;VENTOLIN HFA) 108 (90 BASE) MCG/ACT inhaler Inhale 2 puffs into the lungs 2 (two) times daily as needed for wheezing or shortness of breath. 09/17/14 09/17/15  James Barrack, MD  cetirizine (ZYRTEC) 10 MG tablet TAKE 1 TABLET (10 MG TOTAL) BY MOUTH DAILY. 07/18/15   James Barrack, MD  erythromycin ophthalmic ointment Place a 1/2 inch ribbon of ointment into the lower eyelid. Apply 4 times a day for 5 days. 07/19/15   James [name] C James Poorman, PA-C  fluticasone (FLONASE) 50 MCG/ACT nasal spray Place 2 sprays into both nostrils daily. 09/17/14   James Barrack, MD  hydrochlorothiazide (HYDRODIURIL) 25 MG  tablet Take 1 tablet (25 mg total) by mouth daily. 04/25/15   James Barrack, MD  ibuprofen (ADVIL,MOTRIN) 800 MG tablet Take 1 tablet (800 mg total) by mouth 3 (three) times daily. 07/19/15   James [name] C James Lantz, PA-C  meloxicam (MOBIC) 15 MG tablet Take 1 tablet (15 mg total) by mouth daily. 05/11/14   James Barrack, MD   BP 130/91 mmHg  Pulse 83  Temp(Src) 98 F (36.7 C) (Oral)  Resp 16  SpO2 96% Physical Exam  Constitutional: He is oriented to person, place, and time. He appears well-developed and well-nourished. No distress.  HENT:  Head: Normocephalic and atraumatic.  Eyes: Conjunctivae and EOM are normal. Pupils are equal, round,  and reactive to light.  Very small mass on the right external eyelid consistent with a hordeolum.  Cardiovascular: Normal rate.   Pulmonary/Chest: Effort normal.  Abdominal: He exhibits no distension.  Lymphadenopathy:    He has no cervical adenopathy.  Neurological: He is alert and oriented to person, place, and time.  Skin: Skin is warm and dry.  Psychiatric: He has a normal mood and affect.  Nursing note and vitals reviewed.   ED Course  Procedures   DIAGNOSTIC STUDIES:  Oxygen Saturation is 96% on RA, normal by my interpretation.    COORDINATION OF CARE:  11:17 AM Will discharge with antiinflammatory meds. Advised pt to apply warm compresses. Discussed treatment plan with pt at bedside and pt agreed to plan.    MDM   Final diagnoses:  Hordeolum external, right    James [name] presents with a bump and pain to his right eyelid.  Patient presentation consistent with hordeolum.  Patient has no red flag symptoms. Patient has no signs of infection. Pt discharged with erythromycin ointment.  Pt also advised to apply warm compresses. Return precautions discussed.  Patient verbalizes understanding and is agreeable with discharge.   I personally performed the services described in this documentation, which was scribed in my presence. The recorded information has been reviewed and is accurate.  James Bender, PA-C 07/19/15 [place], MD 07/21/15 [phone]

## 2015-10-05 ENCOUNTER — Ambulatory Visit (INDEPENDENT_AMBULATORY_CARE_PROVIDER_SITE_OTHER): Payer: Medicaid Other | Admitting: Family Medicine

## 2015-10-05 ENCOUNTER — Encounter: Payer: Self-pay | Admitting: Family Medicine

## 2015-10-05 VITALS — BP 132/69 | HR 97 | Temp 98.1°F | Ht 69.0 in | Wt 155.3 lb

## 2015-10-05 DIAGNOSIS — L989 Disorder of the skin and subcutaneous tissue, unspecified: Secondary | ICD-10-CM | POA: Diagnosis not present

## 2015-10-05 DIAGNOSIS — Q8501 Neurofibromatosis, type 1: Secondary | ICD-10-CM

## 2015-10-05 NOTE — Patient Instructions (Signed)
Epidermal Cyst  An epidermal cyst is usually a small, painless lump under the skin. Cysts often occur on the face, neck, stomach, chest, or genitals. The cyst may be filled with a bad smelling paste. Do not pop your cyst. Popping the cyst can cause pain and puffiness (swelling).  HOME CARE   · Only take medicines as told by your doctor.  · Take your medicine (antibiotics) as told. Finish it even if you start to feel better.  GET HELP RIGHT AWAY IF:  · Your cyst is tender, red, or puffy.  · You are not getting better, or you are getting worse.  · You have any questions or concerns.  MAKE SURE YOU:  · Understand these instructions.  · Will watch your condition.  · Will get help right away if you are not doing well or get worse.     This information is not intended to replace advice given to you by your health care provider. Make sure you discuss any questions you have with your health care provider.     Document Released: 06/21/2004 Document Revised: 11/13/2011 Document Reviewed: 11/20/2010  Elsevier Interactive Patient Education ©2016 Elsevier Inc.

## 2015-10-10 NOTE — Progress Notes (Signed)
   Subjective:   James [name] is a 40 y.o. male with a history of NF1 here for skin lesion  Skin lesion:  Have had lesion for 3-4 months Location: central chest Progression: growing and becoming more tender Previous lesions: many, NF1  Symptoms Feeling ill all over: no Fever: no  PMH Immunocompromised: no   ROS see HPI Smoking Status noted   Objective:  BP 132/69 mmHg  Pulse 97  Temp(Src) 98.1 F (36.7 C) (Oral)  Ht 5\' 9"  (1.753 m)  Wt 155 lb 4.8 oz (70.444 kg)  BMI 22.92 kg/m2  Gen:  40 y.o. male in NAD HEENT: NCAT, MMM, anicteric sclerae CV: RRR, no MRG Resp: Non-labored, CTAB, no wheezes noted Abd: Soft, NTND, BS present, no guarding or organomegaly Skin: mobile, tender, 62mm subcutaneous nodule midsternal, scattered raised lesions on arms Neuro: Alert and oriented, speech normal    Assessment & Plan:     James [name] is a 40 y.o. male here for skin lesion  Skin lesion Lump on chest x3-4 months, growing and tender, mobile and cystic on exam - likely sebacious cyst vs neurofibroma as pt with NF1 - rec removal as it is causing discomfort, this will be diagnostic and therapeutic, pt does not want to do this today but will schedule for next week - sched appt with PCP for removal or myself if unavailable      Beverlyn Roux, MD, MPH [place] PGY-3 10/10/2015 5:30 PM

## 2015-10-10 NOTE — Assessment & Plan Note (Signed)
Lump on chest x3-4 months, growing and tender, mobile and cystic on exam - likely sebacious cyst vs neurofibroma as pt with NF1 - rec removal as it is causing discomfort, this will be diagnostic and therapeutic, pt does not want to do this today but will schedule for next week - sched appt with PCP for removal or myself if unavailable

## 2015-10-11 ENCOUNTER — Encounter: Payer: Self-pay | Admitting: Family Medicine

## 2015-10-11 ENCOUNTER — Ambulatory Visit (INDEPENDENT_AMBULATORY_CARE_PROVIDER_SITE_OTHER): Payer: Medicaid Other | Admitting: Family Medicine

## 2015-10-11 VITALS — BP 131/82 | HR 94 | Temp 98.0°F | Ht 69.0 in | Wt 155.0 lb

## 2015-10-11 DIAGNOSIS — M546 Pain in thoracic spine: Secondary | ICD-10-CM

## 2015-10-11 DIAGNOSIS — R229 Localized swelling, mass and lump, unspecified: Secondary | ICD-10-CM

## 2015-10-11 DIAGNOSIS — L989 Disorder of the skin and subcutaneous tissue, unspecified: Secondary | ICD-10-CM

## 2015-10-11 MED ORDER — ACETAMINOPHEN-CODEINE #3 300-30 MG PO TABS: 1.0000 | ORAL_TABLET | Freq: Three times a day (TID) | ORAL | Status: DC | PRN

## 2015-10-11 NOTE — Patient Instructions (Signed)
Excision of Skin Lesions, Care After °Refer to this sheet in the next few weeks. These instructions provide you with information about caring for yourself after your procedure. Your health care provider may also give you more specific instructions. Your treatment has been planned according to current medical practices, but problems sometimes occur. Call your health care provider if you have any problems or questions after your procedure. °WHAT TO EXPECT AFTER THE PROCEDURE °After your procedure, it is common to have pain or discomfort at the excision site. °HOME CARE INSTRUCTIONS °· Take over-the-counter and prescription medicines only as told by your health care provider. °· Follow instructions from your health care provider about: °¨ How to take care of your excision site. You should keep the site clean, dry, and protected for at least 48 hours. °¨ When and how you should change your bandage (dressing). °¨ When you should remove your dressing. °¨ Removing whatever was used to close your excision site. °· Check the excision area every day for signs of infection. Watch for: °¨ Redness, swelling, or pain. °¨ Fluid, blood, or pus. °· For bleeding, apply gentle but firm pressure to the area using a folded towel for 20 minutes. °· Avoid high-impact exercise and activities until the stitches (sutures) are removed or the area heals. °· Follow instructions from your health care provider about how to minimize scarring. Avoid sun exposure until the area has healed. Scarring should lessen over time. °· Keep all follow-up visits as told by your health care provider. This is important. °SEEK MEDICAL CARE IF: °· You have a fever. °· You have redness, swelling, or pain at the excision site. °· You have fluid, blood, or pus coming from the excision site. °· You have ongoing bleeding at the excision site. °· You have pain that does not improve in 2-3 days after your procedure. °· You notice skin irregularities or changes in  sensation. °  °This information is not intended to replace advice given to you by your health care provider. Make sure you discuss any questions you have with your health care provider. °  °Document Released: 09/28/2014 Document Reviewed: 09/28/2014 °Elsevier Interactive Patient Education ©2016 Elsevier Inc. ° °

## 2015-10-11 NOTE — Progress Notes (Signed)
    Subjective:  James [name] is a 40 y.o. male who presents to the [facility] today with a chief complaint of cyst removal.   HPI:  Skin Lesion Patient presents for epidermal cyst removal. Lesion is located in his central chest and has been there for the past several months. No fevers or chills. No drainage. The lesion is painful with movement. The lesions is also increasing in size.   Chronic Pain.  No significant changes since last visit. Intermittently uses Tylenol #3 which helps. No bowel or bladder incontinence. No weakness or numbness. No saddle anesthesia. Has follow up with NF1 clinic at [facility].   ROS: Per HPI Objective:  Physical Exam: BP 131/82 mmHg  Pulse 94  Temp(Src) 98 F (36.7 C) (Oral)  Ht 5\' 9"  (1.753 m)  Wt 155 lb (70.308 kg)  BMI 22.88 kg/m2  SpO2 96%  Gen: NAD, resting comfortably Skin: Mobile, 4-89mm nodule at midternal chest. No erythema. No drainage. Scattered cutaneous neurofibromas noted throughout trunk.  Neuro: grossly normal, moves all extremities Psych: Normal affect and thought content  Procedure Note: Sebaceous Cyst Excision Procedure Note  Pre-operative Diagnosis: Skin Nodule  Post-operative Diagnosis: same  Locations:middle chest  Indications: Therapeutic / Diagnostic  Anesthesia: Lidocaine 1% with epinephrine without added sodium bicarbonate  Procedure Details  History of allergy to iodine: no  Patient informed of the risks (including bleeding and infection) and benefits of the  procedure and Written informed consent obtained.  The lesion and surrounding area was given a sterile prep using betadyne and draped in the usual sterile fashion. An incision was made over the cyst with a  28mm punch, which was dissected free of the surrounding tissue and removed.  Antibiotic ointment and a sterile dressing applied.  The specimen was sent for pathologic examination. The patient tolerated the procedure well.  EBL: 1 ml  Findings: Cutaneous Skin  nodule  Condition: Stable  Complications: none.  Plan: 1. Instructed to keep the wound dry and covered for 24-48h and clean thereafter. 2. Warning signs of infection were reviewed.   3. Recommended that the patient use NSAID as needed for pain.   Assessment/Plan:  Skin lesion Removed today (see procedure note above). Likely neurofibroma. Sent to pathology. Return precautions reviewed.   Back pain At baseline today. Secondary to patient's NF1. No red flag signs or symptoms. Will refill tylenol #3.   [name]. [name], [place] Resident PGY-2 10/11/2015 10:23 AM

## 2015-10-11 NOTE — Assessment & Plan Note (Signed)
Removed today (see procedure note above). Likely neurofibroma. Sent to pathology. Return precautions reviewed.

## 2015-10-11 NOTE — Assessment & Plan Note (Signed)
At baseline today. Secondary to patient's NF1. No red flag signs or symptoms. Will refill tylenol #3.

## 2015-10-13 ENCOUNTER — Encounter: Payer: Self-pay | Admitting: Family Medicine

## 2015-11-24 ENCOUNTER — Other Ambulatory Visit: Payer: Self-pay | Admitting: Family Medicine

## 2016-01-31 ENCOUNTER — Other Ambulatory Visit: Payer: Self-pay | Admitting: Family Medicine

## 2016-01-31 NOTE — Telephone Encounter (Signed)
Rx filled. Needs appointment for further refills.  [name]. [name], [place] Resident PGY-3 01/31/2016 9:43 AM

## 2016-02-08 ENCOUNTER — Emergency Department (HOSPITAL_COMMUNITY)
Admission: EM | Admit: 2016-02-08 | Discharge: 2016-02-09 | Disposition: A | Discharge status: Home / Self Care | Payer: Medicaid Other | Attending: Emergency Medicine | Admitting: Emergency Medicine

## 2016-02-08 ENCOUNTER — Encounter (HOSPITAL_COMMUNITY): Payer: Self-pay

## 2016-02-08 DIAGNOSIS — I10 Essential (primary) hypertension: Secondary | ICD-10-CM | POA: Insufficient documentation

## 2016-02-08 DIAGNOSIS — J449 Chronic obstructive pulmonary disease, unspecified: Secondary | ICD-10-CM | POA: Insufficient documentation

## 2016-02-08 DIAGNOSIS — R042 Hemoptysis: Secondary | ICD-10-CM

## 2016-02-08 DIAGNOSIS — Z79899 Other long term (current) drug therapy: Secondary | ICD-10-CM | POA: Diagnosis not present

## 2016-02-08 DIAGNOSIS — F17210 Nicotine dependence, cigarettes, uncomplicated: Secondary | ICD-10-CM | POA: Insufficient documentation

## 2016-02-08 DIAGNOSIS — J40 Bronchitis, not specified as acute or chronic: Secondary | ICD-10-CM

## 2016-02-08 DIAGNOSIS — J209 Acute bronchitis, unspecified: Secondary | ICD-10-CM | POA: Diagnosis not present

## 2016-02-08 DIAGNOSIS — R0600 Dyspnea, unspecified: Secondary | ICD-10-CM

## 2016-02-08 MED ORDER — ALBUTEROL SULFATE (2.5 MG/3ML) 0.083% IN NEBU
5.0000 mg | INHALATION_SOLUTION | Freq: Once | RESPIRATORY_TRACT | Status: AC
  Administered 2016-02-09: 5 mg via RESPIRATORY_TRACT
  Filled 2016-02-08: qty 6

## 2016-02-08 NOTE — ED Notes (Signed)
Pt has an appt at [facility] Friday for his neurofibrosis

## 2016-02-08 NOTE — ED Triage Notes (Signed)
Pt complains of coughing and having chest tightness for over one week

## 2016-02-09 ENCOUNTER — Emergency Department (HOSPITAL_COMMUNITY): Payer: Medicaid Other

## 2016-02-09 LAB — I-STAT CHEM 8, ED
BUN: 3 mg/dL — AB (ref 6–20)
CALCIUM ION: 0.85 mmol/L — AB (ref 1.15–1.40)
Chloride: 101 mmol/L (ref 101–111)
Creatinine, Ser: 0.9 mg/dL (ref 0.61–1.24)
GLUCOSE: 100 mg/dL — AB (ref 65–99)
HEMATOCRIT: 55 % — AB (ref 39.0–52.0)
Hemoglobin: 18.7 g/dL — ABNORMAL HIGH (ref 13.0–17.0)
Potassium: 7.9 mmol/L (ref 3.5–5.1)
Sodium: 132 mmol/L — ABNORMAL LOW (ref 135–145)
TCO2: 26 mmol/L (ref 0–100)

## 2016-02-09 MED ORDER — BENZONATATE 100 MG PO CAPS
200.0000 mg | ORAL_CAPSULE | Freq: Once | ORAL | Status: AC
  Administered 2016-02-09: 200 mg via ORAL
  Filled 2016-02-09: qty 2

## 2016-02-09 MED ORDER — PREDNISONE 10 MG PO TABS: 60.0000 mg | ORAL_TABLET | Freq: Every day | ORAL | 0 refills | Status: DC

## 2016-02-09 MED ORDER — IOPAMIDOL (ISOVUE-370) INJECTION 76%
100.0000 mL | Freq: Once | INTRAVENOUS | Status: AC | PRN
  Administered 2016-02-09: 100 mL via INTRAVENOUS

## 2016-02-09 MED ORDER — BENZONATATE 100 MG PO CAPS: 100.0000 mg | ORAL_CAPSULE | Freq: Three times a day (TID) | ORAL | 0 refills | Status: DC

## 2016-02-09 MED ORDER — GUAIFENESIN-CODEINE 100-10 MG/5ML PO SOLN
10.0000 mL | Freq: Once | ORAL | Status: AC
  Administered 2016-02-09: 10 mL via ORAL
  Filled 2016-02-09: qty 10

## 2016-02-09 MED ORDER — GUAIFENESIN-CODEINE 100-10 MG/5ML PO SOLN: 10.0000 mL | Freq: Four times a day (QID) | ORAL | 0 refills | Status: DC | PRN

## 2016-02-09 NOTE — ED Provider Notes (Signed)
[facility] DEPT Provider Note   CSN: [id number] Arrival date & time: 02/08/16  2321  By signing my name below, I, Julien Nordmann, attest that this documentation has been prepared under the direction and in the presence of Varney Biles, MD.  Electronically Signed: Julien Nordmann, ED Scribe. 02/09/16. 12:44 AM.   History   Chief Complaint Chief Complaint  Patient presents with  . Hemoptysis    The history is provided by the patient. No language interpreter was used.   HPI Comments: James [name] is a 40 y.o. male who has a PMhx of bronchitis, COPD, GERD, HTN presents to the Emergency Department complaining of constant, gradual worsening, moderate, productive cough with intermittent bloody sputum x 1 week. Associated central chest pain secondary to cough, SOB secondary to cough wheezing, chills, and nausea. He has used his inhaler twice today with no relief. Pt has also taken multiple OTC medications with no relief. Pt has neurofibromatosis of the spine and is followed by [facility]. His next appointment is Friday 9/15. Pt denies fever, vomiting, or hx of cancer.   Past Medical History:  Diagnosis Date  . Black stools   . Bronchitis    11/2011  . COPD (chronic obstructive pulmonary disease) ([place])   . Difficulty sleeping   . Dizziness   . Former smoker   . GERD (gastroesophageal reflux disease)   . Hypertension goal BP (blood pressure) < 140/90 12/21/2011  . 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 Shortness of breath    with exertion  . Vomiting blood     Patient Active Problem List   Diagnosis Date Noted  . Skin lesion 04/25/2015  . Cough 09/17/2014  . Healthcare maintenance 09/17/2014  . Tobacco abuse 05/11/2014  . 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 Vitamin D deficiency 08/21/2013  . Back pain 03/17/2012  . Hemoptysis 01/09/2012  . Oral mucosal lesion 01/01/2012  . Hypertension 12/21/2011  . Neurofibromatosis, type 1 (von Recklinghausen's disease) ([place])  12/10/2011  . Shortness of breath 12/06/2011    Past Surgical History:  Procedure Laterality Date  . ESOPHAGOGASTRODUODENOSCOPY (EGD) WITH PROPOFOL  05/14/2012   Procedure: ESOPHAGOGASTRODUODENOSCOPY (EGD) WITH PROPOFOL;  Surgeon: Arta Silence, MD;  Location: [facility] ENDOSCOPY;  Service: Endoscopy;  Laterality: N/A;  . NO PAST SURGERIES    . none         Home Medications    Prior to Admission medications   Medication Sig Start Date End Date Taking? Authorizing Provider  cetirizine (ZYRTEC) 10 MG tablet TAKE 1 TABLET (10 MG TOTAL) BY MOUTH DAILY. 07/18/15  Yes Vivi Barrack, MD  hydrochlorothiazide (MICROZIDE) 12.5 MG capsule TAKE 1 CAPSULE DAILY. Patient taking differently: Take 12.5 mg by mouth daily 01/31/16  Yes Vivi Barrack, MD  PROAIR HFA 108 [phone] Base) MCG/ACT inhaler INHALE 2 PUFFS INTO THE LUNGS 2 (TWO) TIMES DAILY AS NEEDED FOR WHEEZING OR SHORTNESS OF BREATH. 11/25/15  Yes Vivi Barrack, MD  acetaminophen-codeine (TYLENOL #3) 300-30 MG tablet Take 1 tablet by mouth every 8 (eight) hours as needed for moderate pain. Patient not taking: Reported on 02/08/2016 10/11/15   Vivi Barrack, MD  benzonatate (TESSALON) 100 MG capsule Take 1 capsule (100 mg total) by mouth every 8 (eight) hours. 02/09/16   Varney Biles, MD  erythromycin ophthalmic ointment Place a 1/2 inch ribbon of ointment into the lower eyelid. Apply 4 times a day for 5 days. Patient not taking: Reported on 02/08/2016 07/19/15   Shawn C  Joy, PA-C  fluticasone (FLONASE) 50 MCG/ACT nasal spray Place 2 sprays into both nostrils daily. Patient not taking: Reported on 02/08/2016 09/17/14   Vivi Barrack, MD  guaiFENesin-codeine 100-10 MG/5ML syrup Take 10 mLs by mouth every 6 (six) hours as needed for cough. 02/09/16   Varney Biles, MD  hydrochlorothiazide (HYDRODIURIL) 25 MG tablet Take 1 tablet (25 mg total) by mouth daily. Patient not taking: Reported on 02/08/2016 04/25/15   Vivi Barrack, MD  predniSONE (DELTASONE) 10 MG  tablet Take 6 tablets (60 mg total) by mouth daily. 02/09/16   Varney Biles, MD    Family History Family History  Problem Relation Age of Onset  . Diabetes Mother     and father  . Hypertension Mother     and father  . Hypertension Father   . Diabetes Father   . Neurofibromatosis Brother     deceased  . Neurofibromatosis Daughter   . Neurofibromatosis Daughter     Social History Social History  Substance Use Topics  . Smoking status: Current Every Day Smoker    Packs/day: 0.15    Years: 17.00    Types: Cigarettes    Last attempt to quit: 12/05/2011  . Smokeless tobacco: Never Used  . Alcohol use No     Comment: quit: 05/2012     Allergies   Review of patient's allergies indicates no known allergies.   Review of Systems Review of Systems  A complete 10 system review of systems was obtained and all systems are negative except as noted in the HPI and PMH.   Physical Exam Updated Vital Signs BP 132/92   Pulse 74   Temp 97.7 F (36.5 C) (Oral)   Resp 18   SpO2 96%   Physical Exam  Constitutional: He is oriented to person, place, and time. He appears well-developed and well-nourished.  HENT:  Head: Normocephalic and atraumatic.  Eyes: EOM are normal.  Neck: Normal range of motion. No JVD present.  Cardiovascular: Regular rhythm, normal heart sounds and intact distal pulses.  Tachycardia present.   Pulmonary/Chest: Effort normal and breath sounds normal. No respiratory distress.  Lungs are clear  Abdominal: Soft. He exhibits no distension. There is no tenderness.  Musculoskeletal: Normal range of motion.  Legs do not reveal any unilateral calf pitting edema  Neurological: He is alert and oriented to person, place, and time.  Skin: Skin is warm and dry.  Psychiatric: He has a normal mood and affect. Judgment normal.  Nursing note and vitals reviewed.    ED Treatments / Results  DIAGNOSTIC STUDIES: Oxygen Saturation is 95% on RA, adequate by my  interpretation.  COORDINATION OF CARE:  12:44 AM Discussed treatment plan with pt at bedside and pt agreed to plan.  Labs (all labs ordered are listed, but only abnormal results are displayed) Labs Reviewed  I-STAT CHEM 8, ED - Abnormal; Notable for the following:       Result Value   Sodium 132 (*)    Potassium 7.9 (*)    BUN 3 (*)    Glucose, Bld 100 (*)    Calcium, Ion 0.85 (*)    Hemoglobin 18.7 (*)    HCT 55.0 (*)    All other components within normal limits    EKG  EKG Interpretation  Date/Time:  Thursday February 09 2016 00:11:27 EDT Ventricular Rate:  105 PR Interval:    QRS Duration: 82 QT Interval:  339 QTC Calculation: 448 R Axis:   66 Text  Interpretation:  Sinus tachycardia Anteroseptal infarct, old No acute changes No significant change since last tracing Confirmed by Kathrynn Humble, MD, Thelma Comp [phone]) on 02/09/2016 3:42:22 AM       Radiology Dg Chest 2 View  Result Date: 02/09/2016 CLINICAL DATA:  Worsening cough over the past week. EXAM: CHEST  2 VIEW COMPARISON:  02/03/2012 FINDINGS: Moderate hyperinflation. Stable linear scarring in the apices. The lungs are otherwise clear. The pulmonary vasculature is normal. Hilar, mediastinal and cardiac contours are unremarkable and unchanged. No pleural effusions. IMPRESSION: Hyperinflation.  Stable linear scarring. Electronically Signed   By: Andreas Newport M.D.   On: 02/09/2016 00:51   Ct Angio Chest Pe W Or Wo Contrast  Result Date: 02/09/2016 CLINICAL DATA:  Coughing and chest tightness EXAM: CT ANGIOGRAPHY CHEST WITH CONTRAST TECHNIQUE: Multidetector CT imaging of the chest was performed using the standard protocol during bolus administration of intravenous contrast. Multiplanar CT image reconstructions and MIPs were obtained to evaluate the vascular anatomy. CONTRAST:  100 mL Isovue 370 IV COMPARISON:  Chest radiograph 02/09/2016 and CTA of the chest 12/05/2011 FINDINGS: Vascular: No pulmonary embolus. The main  pulmonary artery is within normal limits for size. No CT evidence of acute right heart strain. Visualized portion of the aorta is normal. Heart size is normal. Mediastinum/Nodes: No mediastinal or axillary adenopathy. 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 There is apical predominant emphysema. There is bibasilar dependent atelectasis. No pleural effusion.There is a pleural based mass at the lateral aspect of the left hemi thorax, between the second and third ribs. This has increased in size compared to the 12/05/2011 examination. Visualized abdomen: Contrast bolus timing is not optimized for evaluation of the abdominal organs. Within that limitation, the visualized upper abdominal organs are normal. Musculoskeletal: No lytic or blastic osseous lesions. The visualized extrathoracic soft tissues are normal. Review of the MIP images confirms the above findings. IMPRESSION: 1. No pulmonary embolus or acute abnormality of the chest. 2. Emphysema. 3. Soft tissue mass of the left hemithoracic lateral pleural surface, between the second and third ribs. Size is increased compared to the 2013 examination. 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 Electronically Signed   By: Ulyses Jarred M.D.   On: 02/09/2016 04:29    Procedures Procedures (including critical care time)  Medications Ordered in ED Medications  albuterol (PROVENTIL) (2.5 MG/3ML) 0.083% nebulizer solution 5 mg (5 mg Nebulization Given 02/09/16 0032)  benzonatate (TESSALON) capsule 200 mg (200 mg Oral Given 02/09/16 0204)  guaiFENesin-codeine 100-10 MG/5ML solution 10 mL (10 mLs Oral Given 02/09/16 0204)  iopamidol (ISOVUE-370) 76 % injection 100 mL (100 mLs Intravenous Contrast Given 02/09/16 0406)     Initial Impression / Assessment and Plan / ED Course  I have reviewed the triage vital signs and the nursing notes.  Pertinent labs & imaging results that were available during my care of the  patient were reviewed by me and considered in my medical decision making (see chart for details).  Clinical Course  Comment By Time  CT PE is neg. A CD of the CT has been provided to the patient, as [facility] is to [facility]. Strict ER return precautions have been discussed, and patient is agreeing with the plan and is comfortable with the workup done and the recommendations from the ER.  Varney Biles, MD 09/14 [phone]    PT comes in with cough and hemoptysis. He has hx of neurofibromatosis. Pt received nebs,  and states that he really didn't feel much better. He is tachypneic, O2 sats in the low 90s.  Literature review reveals that there is correlation between NF and PE. WE will get CT angio - with hemoptysis and tachycardia, his WELLS is 2.5, and add + correlation to NF and pt is not a good dimer candidate.  Final Clinical Impressions(s) / ED Diagnoses   Final diagnoses:  Dyspnea  Bronchitis  Cough with hemoptysis    New Prescriptions New Prescriptions   BENZONATATE (TESSALON) 100 MG CAPSULE    Take 1 capsule (100 mg total) by mouth every 8 (eight) hours.   GUAIFENESIN-CODEINE 100-10 MG/5ML SYRUP    Take 10 mLs by mouth every 6 (six) hours as needed for cough.   PREDNISONE (DELTASONE) 10 MG TABLET    Take 6 tablets (60 mg total) by mouth daily.     Varney Biles, MD 02/09/16 [phone]

## 2016-02-09 NOTE — ED Notes (Signed)
Pt to xray

## 2016-02-09 NOTE — ED Notes (Signed)
MD at bedside. 

## 2016-04-01 ENCOUNTER — Other Ambulatory Visit: Payer: Self-pay | Admitting: Family Medicine

## 2016-05-02 ENCOUNTER — Other Ambulatory Visit: Payer: Self-pay | Admitting: Family Medicine

## 2016-05-02 NOTE — Telephone Encounter (Signed)
Patient is aware of refill and made appt for tomorrow at 4:15p to be seen. Jazmin Hartsell,CMA

## 2016-05-03 ENCOUNTER — Encounter: Payer: Self-pay | Admitting: Family Medicine

## 2016-05-03 ENCOUNTER — Ambulatory Visit (INDEPENDENT_AMBULATORY_CARE_PROVIDER_SITE_OTHER): Payer: Medicaid Other | Admitting: Family Medicine

## 2016-05-03 VITALS — BP 120/88 | Temp 97.7°F | Wt 154.0 lb

## 2016-05-03 DIAGNOSIS — R059 Cough, unspecified: Secondary | ICD-10-CM

## 2016-05-03 DIAGNOSIS — Q8501 Neurofibromatosis, type 1: Secondary | ICD-10-CM | POA: Diagnosis not present

## 2016-05-03 DIAGNOSIS — Z23 Encounter for immunization: Secondary | ICD-10-CM | POA: Diagnosis not present

## 2016-05-03 DIAGNOSIS — I10 Essential (primary) hypertension: Secondary | ICD-10-CM

## 2016-05-03 DIAGNOSIS — R05 Cough: Secondary | ICD-10-CM | POA: Diagnosis not present

## 2016-05-03 LAB — CBC
HCT: 50.5 % — ABNORMAL HIGH (ref 38.5–50.0)
HEMOGLOBIN: 17.1 g/dL (ref 13.2–17.1)
MCH: 33.2 pg — AB (ref 27.0–33.0)
MCHC: 33.9 g/dL (ref 32.0–36.0)
MCV: 98.1 fL (ref 80.0–100.0)
MPV: 10.6 fL (ref 7.5–12.5)
PLATELETS: 222 10*3/uL (ref 140–400)
RBC: 5.15 MIL/uL (ref 4.20–5.80)
RDW: 12.6 % (ref 11.0–15.0)
WBC: 6.8 10*3/uL (ref 3.8–10.8)

## 2016-05-03 MED ORDER — FLUTICASONE PROPIONATE 50 MCG/ACT NA SUSP: 2.0000 | Freq: Every day | NASAL | 2 refills | Status: DC

## 2016-05-03 MED ORDER — RANITIDINE HCL 150 MG PO TABS: 150.0000 mg | ORAL_TABLET | Freq: Two times a day (BID) | ORAL | 11 refills | Status: DC

## 2016-05-03 MED ORDER — ACETAMINOPHEN-CODEINE #3 300-30 MG PO TABS: 1.0000 | ORAL_TABLET | Freq: Three times a day (TID) | ORAL | 1 refills | Status: DC | PRN

## 2016-05-03 NOTE — Assessment & Plan Note (Addendum)
Pain stable today, though per his last clinic note with [facility], it appears that they wanted him to have a neurosurgical evaluation. Last imaging in our system from 2016 without signs of lumbar nerve involvement, though was found to have neural involvement at the thoracic level. Will place referral today. No red flag signs or symptoms. Refilled tylenol #3.

## 2016-05-03 NOTE — Assessment & Plan Note (Signed)
At goal today. Continue HCTZ. Check BMEt today.

## 2016-05-03 NOTE — Patient Instructions (Signed)
We will check blood work today.  We will refer you with neurosurgery.  Start the flonase and the ranitidine.  Come back in 3-6 months.  Take care,  Dr [name]

## 2016-05-03 NOTE — Assessment & Plan Note (Signed)
Likely post nasal drip with possible GERD component. Restart flonase today. Continue zyrtec. Start ranitidine to cover GERD. Patient incidentally found to have pleural lesion on chest CTA from 3 months ago - Discussed with Dr Andria Frames. Likely do be NF lesion and not likely to be contributing to cough.

## 2016-05-03 NOTE — Progress Notes (Signed)
    Subjective:  James [name] is a 40 y.o. male who presents to the [facility] today with a chief complaint of cough.   HPI:  Hypertension BP Readings from Last 3 Encounters:  05/03/16 120/88  02/09/16 132/92  10/11/15 131/82   Home BP monitoring-Yes Compliant with medications-yes, without side effects ROS-Denies any CP, HA, SOB, blurry vision, LE edema, transient weakness, orthopnea, PND.   Cough Patient with chronic cough for the past several months. Went to the ED three months ago and was diagnosed with bronchitis. Was given a course of prednisone which did not seem to help. Patient with frequent reflux symptoms. Not currently taking flonase.   Chronic Pain No significant changes since his last visit. 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 Also has some pain in his RUE. Has been following with NF1 clinic at [facility]. No weakness or numbness. No saddle anesthesia.   ROS: Per HPI  PMH: Smoking history reviewed.    Objective:  Physical Exam: BP 120/88   Temp 97.7 F (36.5 C) (Oral)   Wt 154 lb (69.9 kg)   BMI 22.74 kg/m   Gen: NAD, resting comfortably CV: RRR with no murmurs appreciated Pulm: NWOB, CTAB with no crackles, wheezes, or rhonchi GI: Normal bowel sounds present. Soft, Nontender, Nondistended. MSK: no edema, cyanosis, or clubbing noted Skin: warm, dry Neuro: grossly normal, moves all extremities Psych: Normal affect and thought content   Assessment/Plan:  Hypertension At goal today. Continue HCTZ. Check BMEt today.   Neurofibromatosis, type 1 (von Recklinghausen's disease) Pain stable today, though per his last clinic note with [facility], it appears that they wanted him to have a neurosurgical evaluation. Last imaging in our system from 2016 without signs of lumbar nerve involvement, though was found to have neural involvement at the thoracic level. Will place referral today. No red flag signs or symptoms. Refilled tylenol #3.   Cough Likely post nasal  drip with possible GERD component. Restart flonase today. Continue zyrtec. Start ranitidine to cover GERD. Patient incidentally found to have pleural lesion on chest CTA from 3 months ago - Discussed with Dr Andria Frames. Likely do be NF lesion and not likely to be contributing to cough.   [name]. [name], [place] Resident PGY-3 05/03/2016 5:19 PM

## 2016-05-04 ENCOUNTER — Encounter: Payer: Self-pay | Admitting: Family Medicine

## 2016-05-04 LAB — LIPID PANEL
Cholesterol: 204 mg/dL — ABNORMAL HIGH (ref <200)
HDL: 59 mg/dL (ref >40)
LDL CALC: 125 mg/dL — AB (ref <100)
TRIGLYCERIDES: 102 mg/dL (ref <150)
Total CHOL/HDL Ratio: 3.5 Ratio (ref <5.0)
VLDL: 20 mg/dL (ref <30)

## 2016-05-04 LAB — BASIC METABOLIC PANEL WITH GFR
BUN: 11 mg/dL (ref 7–25)
CALCIUM: 9.8 mg/dL (ref 8.6–10.3)
CO2: 25 mmol/L (ref 20–31)
CREATININE: 0.9 mg/dL (ref 0.60–1.35)
Chloride: 96 mmol/L — ABNORMAL LOW (ref 98–110)
Glucose, Bld: 82 mg/dL (ref 65–99)
Potassium: 3.8 mmol/L (ref 3.5–5.3)
SODIUM: 135 mmol/L (ref 135–146)

## 2016-05-26 ENCOUNTER — Other Ambulatory Visit: Payer: Self-pay | Admitting: Family Medicine

## 2016-08-30 ENCOUNTER — Other Ambulatory Visit: Payer: Self-pay | Admitting: Family Medicine

## 2016-09-05 ENCOUNTER — Ambulatory Visit (INDEPENDENT_AMBULATORY_CARE_PROVIDER_SITE_OTHER): Payer: Medicaid Other | Admitting: Family Medicine

## 2016-09-05 ENCOUNTER — Encounter: Payer: Self-pay | Admitting: Family Medicine

## 2016-09-05 VITALS — BP 120/82 | HR 88 | Temp 98.1°F | Ht 69.0 in | Wt 152.8 lb

## 2016-09-05 DIAGNOSIS — Q8501 Neurofibromatosis, type 1: Secondary | ICD-10-CM

## 2016-09-05 NOTE — Patient Instructions (Signed)
Referral placed for Dermatology at [facility]. Please let us know if you do not hear from them soon. Please take Ibuprofen with your Tylenol for better pain relief.  Dr. Gerlean Ren

## 2016-09-05 NOTE — Progress Notes (Signed)
Subjective:     Patient ID: James [name], male   DOB: [birth year], 41 y.o.   MRN: [id number]  HPI James [name] is a 41yo male presenting today for knot on abdomen. Reports new knot on his left abdomen and left arm. Consistent with other knots he has secondary to Neurofibromatosis type 1, but these are painful. Denies redness, warmth, drainage, fever. He has had a similar painful lesion before in his right upper arm which was removed by James [name] one year ago. Has been using Tylenol #3 with some relief. Former smoker.   Review of Systems Per HPI    Objective:   Physical Exam  Constitutional: He appears well-developed and well-nourished. No distress.  Abdominal: Soft. He exhibits no distension. There is no tenderness.  Skin:  0.5x0.5cm nodules, superficial, highly mobile nodules palpable in left abdomen and left forearm      Assessment and Plan:     1. Neurofibromatosis, type 1 (von Recklinghausen's disease) ([place]) Referral placed back to [name] for possible removal. No signs of acute infection, antibiotics not indicated. Continue Tylenol #3 for pain relief, may add Ibuprofen if needed. Follow up if no improvement.

## 2016-10-05 ENCOUNTER — Other Ambulatory Visit: Payer: Self-pay | Admitting: Family Medicine

## 2016-10-24 ENCOUNTER — Ambulatory Visit (INDEPENDENT_AMBULATORY_CARE_PROVIDER_SITE_OTHER): Payer: Medicaid Other | Admitting: Family Medicine

## 2016-10-24 ENCOUNTER — Encounter: Payer: Self-pay | Admitting: Family Medicine

## 2016-10-24 DIAGNOSIS — J439 Emphysema, unspecified: Secondary | ICD-10-CM

## 2016-10-24 DIAGNOSIS — J449 Chronic obstructive pulmonary disease, unspecified: Secondary | ICD-10-CM | POA: Insufficient documentation

## 2016-10-24 DIAGNOSIS — R05 Cough: Secondary | ICD-10-CM

## 2016-10-24 DIAGNOSIS — R059 Cough, unspecified: Secondary | ICD-10-CM

## 2016-10-24 MED ORDER — OMEPRAZOLE MAGNESIUM 20 MG PO TBEC: 20.0000 mg | DELAYED_RELEASE_TABLET | Freq: Every day | ORAL | 5 refills | Status: DC

## 2016-10-24 MED ORDER — FLUTICASONE-SALMETEROL 500-50 MCG/DOSE IN AEPB: 1.0000 | INHALATION_SPRAY | Freq: Two times a day (BID) | RESPIRATORY_TRACT | 5 refills | Status: DC

## 2016-10-24 NOTE — Patient Instructions (Signed)
Steps to Quit Smoking Smoking tobacco can be bad for your health. It can also affect almost every organ in your body. Smoking puts you and people around you at risk for many serious long-lasting (chronic) diseases. Quitting smoking is hard, but it is one of the best things that you can do for your health. It is never too late to quit. What are the benefits of quitting smoking? When you quit smoking, you lower your risk for getting serious diseases and conditions. They can include:  Lung cancer or lung disease.  Heart disease.  Stroke.  Heart attack.  Not being able to have children (infertility).  Weak bones (osteoporosis) and broken bones (fractures). If you have coughing, wheezing, and shortness of breath, those symptoms may get better when you quit. You may also get sick less often. If you are pregnant, quitting smoking can help to lower your chances of having a baby of low birth weight. What can I do to help me quit smoking? Talk with your doctor about what can help you quit smoking. Some things you can do (strategies) include:  Quitting smoking totally, instead of slowly cutting back how much you smoke over a period of time.  Going to in-person counseling. You are more likely to quit if you go to many counseling sessions.  Using resources and support systems, such as:  Online chats with a counselor.  Phone quitlines.  Printed self-help materials.  Support groups or group counseling.  Text messaging programs.  Mobile phone apps or applications.  Taking medicines. Some of these medicines may have nicotine in them. If you are pregnant or breastfeeding, do not take any medicines to quit smoking unless your doctor says it is okay. Talk with your doctor about counseling or other things that can help you. Talk with your doctor about using more than one strategy at the same time, such as taking medicines while you are also going to in-person counseling. This can help make quitting  easier. What things can I do to make it easier to quit? Quitting smoking might feel very hard at first, but there is a lot that you can do to make it easier. Take these steps:  Talk to your family and friends. Ask them to support and encourage you.  Call phone quitlines, reach out to support groups, or work with a counselor.  Ask people who smoke to not smoke around you.  Avoid places that make you want (trigger) to smoke, such as:  Bars.  Parties.  Smoke-break areas at work.  Spend time with people who do not smoke.  Lower the stress in your life. Stress can make you want to smoke. Try these things to help your stress:  Getting regular exercise.  Deep-breathing exercises.  Yoga.  Meditating.  Doing a body scan. To do this, close your eyes, focus on one area of your body at a time from head to toe, and notice which parts of your body are tense. Try to relax the muscles in those areas.  Download or buy apps on your mobile phone or tablet that can help you stick to your quit plan. There are many free apps, such as QuitGuide from the CDC (Centers for Disease Control and Prevention). You can find more support from smokefree.gov and other websites. This information is not intended to replace advice given to you by your health care provider. Make sure you discuss any questions you have with your health care provider. Document Released: 03/10/2009 Document Revised: 01/10/2016 Document   Reviewed: 09/28/2014 Elsevier Interactive Patient Education  2017 [employer] Inc. Chronic Obstructive Pulmonary Disease Chronic obstructive pulmonary disease (COPD) is a long-term (chronic) lung problem. When you have COPD, it is hard for air to get in and out of your lungs. The way your lungs work will never return to normal. Usually the condition gets worse over time. There are things you can do to keep yourself as healthy as possible. Your doctor may treat your condition with:  Medicines.  Quitting  smoking, if you smoke.  Rehabilitation. This may involve a team of specialists.  Oxygen.  Exercise and changes to your diet.  Lung surgery.  Comfort measures (palliative care). Follow these instructions at home: Medicines   Take over-the-counter and prescription medicines only as told by your doctor.  Talk to your doctor before taking any cough or allergy medicines. You may need to avoid medicines that cause your lungs to be dry. Lifestyle   If you smoke, stop. Smoking makes the problem worse. If you need help quitting, ask your doctor.  Avoid being around things that make your breathing worse. This may include smoke, chemicals, and fumes.  Stay active, but remember to also rest.  Learn and use tips on how to relax.  Make sure you get enough sleep. Most adults need at least 7 hours a night.  Eat healthy foods. Eat smaller meals more often. Rest before meals. Controlled breathing   Learn and use tips on how to control your breathing as told by your doctor. Try:  Breathing in (inhaling) through your nose for 1 second. Then, pucker your lips and breath out (exhale) through your lips for 2 seconds.  Putting one hand on your belly (abdomen). Breathe in slowly through your nose for 1 second. Your hand on your belly should move out. Pucker your lips and breathe out slowly through your lips. Your hand on your belly should move in as you breathe out. Controlled coughing   Learn and use controlled coughing to clear mucus from your lungs. The steps are: 1. Lean your head a little forward. 2. Breathe in deeply. 3. Try to hold your breath for 3 seconds. 4. Keep your mouth slightly open while coughing 2 times. 5. Spit any mucus out into a tissue. 6. Rest and do the steps again 1 or 2 times as needed. General instructions   Make sure you get all the shots (vaccines) that your doctor recommends. Ask your doctor about a flu shot and a pneumonia shot.  Use oxygen therapy and therapy  to help improve your lungs (pulmonary rehabilitation) if told by your doctor. If you need home oxygen therapy, ask your doctor if you should buy a tool to measure your oxygen level (oximeter).  Make a COPD action plan with your doctor. This helps you know what to do if you feel worse than usual.  Manage any other conditions you have as told by your doctor.  Avoid going outside when it is very hot, cold, or humid.  Avoid people who have a sickness you can catch (contagious).  Keep all follow-up visits as told by your doctor. This is important. Contact a doctor if:  You cough up more mucus than usual.  There is a change in the color or thickness of the mucus.  It is harder to breathe than usual.  Your breathing is faster than usual.  You have trouble sleeping.  You need to use your medicines more often than usual.  You have trouble doing your normal  activities such as getting dressed or walking around the house. Get help right away if:  You have shortness of breath while resting.  You have shortness of breath that stops you from:  Being able to talk.  Doing normal activities.  Your chest hurts for longer than 5 minutes.  Your skin color is more blue than usual.  Your pulse oximeter shows that you have low oxygen for longer than 5 minutes.  You have a fever.  You feel too tired to breathe normally. Summary  Chronic obstructive pulmonary disease (COPD) is a long-term lung problem.  The way your lungs work will never return to normal. Usually the condition gets worse over time. There are things you can do to keep yourself as healthy as possible.  Take over-the-counter and prescription medicines only as told by your doctor.  If you smoke, stop. Smoking makes the problem worse. This information is not intended to replace advice given to you by your health care provider. Make sure you discuss any questions you have with your health care provider. Document Released:  10/31/2007 Document Revised: 10/20/2015 Document Reviewed: 01/08/2013 Elsevier Interactive Patient Education  2017 [employer].

## 2016-10-24 NOTE — Assessment & Plan Note (Addendum)
Based on last CT-given look and age < 66 and family history, will rule out Alpha-1 Antitrypsin deficiency and send to pulmonary. Repeat PFT's. Suspect this is linked to his coughing.

## 2016-10-24 NOTE — Progress Notes (Signed)
   Subjective:    Patient ID: James [name] is a 41 y.o. male presenting with Cough  on 10/24/2016  HPI: Cough since September--seen in ED placed on Tessalon and given Prednisone. CT showed emphysema and small NF nodule Seen here in 12/17 and given Flonase, Zyrtec, and Zantac. No help. No longer taking Zantac but Tums only. Reports frequent heartburn. Continues to smoke (17 year hx, but 1/2 ppd only and quit for some years in between)-->interested in quitting. Reports h/o COPD, but not true on PFT's (done some time ago). Mother has h/o COPD and never smoked. No f/h liver disease. Feels like he cannot catch his breath when coughing and coughing is mostly at night and is thick with secretions. Reports DOE  Review of Systems  Constitutional: Negative for chills and fever.  HENT: Negative for congestion, postnasal drip, rhinorrhea, sinus pressure and sneezing.   Respiratory: Positive for cough and shortness of breath. Negative for wheezing.   Cardiovascular: Negative for chest pain and leg swelling.  Gastrointestinal: Negative for abdominal pain, nausea and vomiting.      Objective:    BP 110/74   Pulse 84   Temp 98.1 F (36.7 C) (Oral)   Wt 158 lb (71.7 kg)   SpO2 97%   BMI 23.33 kg/m  Physical Exam  Constitutional: He appears well-developed and well-nourished. No distress.  HENT:  Head: Normocephalic and atraumatic.  Eyes: No scleral icterus.  Neck: Neck supple.  Cardiovascular: Normal rate.   Pulmonary/Chest: Effort normal. He has no wheezes.  Distant breath sounds superiorly  Abdominal: Soft.  Musculoskeletal: He exhibits no edema.  Neurological: He is alert.  Skin: Skin is warm.  Psychiatric: He has a normal mood and affect.  Vitals reviewed.       Assessment & Plan:   Problem List Items Addressed This Visit      Unprioritized   Cough    Not on ACE-1, little improvement with anti-histamine or flonase--add Prilosec, stop tums and zantac. Add LABA with inhaled  steroid to see if chronic emphysema is playing a role.      Emphysema lung (HCC)    Based on last CT-given look and age < 65 and family history, will rule out Alpha-1 Antitrypsin deficiency and send to pulmonary. Repeat PFT's. Suspect this is linked to his coughing.      Relevant Medications   omeprazole (PRILOSEC OTC) 20 MG tablet   Fluticasone-Salmeterol (ADVAIR DISKUS) 500-50 MCG/DOSE AEPB   Other Relevant Orders   Ambulatory referral to Pulmonology   Alpha-1-Antitrypsin   Pulmonary function test       Total face-to-face time with patient: 25 minutes. Over 50% of encounter was spent on counseling and coordination of care. Return if symptoms worsen or fail to improve.  Donnamae Jude 10/24/2016 8:51 AM

## 2016-10-24 NOTE — Assessment & Plan Note (Signed)
Not on ACE-1, little improvement with anti-histamine or flonase--add Prilosec, stop tums and zantac. Add LABA with inhaled steroid to see if chronic emphysema is playing a role.

## 2016-10-25 LAB — ALPHA-1-ANTITRYPSIN: A1 ANTITRYPSIN: 122 mg/dL (ref 90–200)

## 2016-10-29 ENCOUNTER — Telehealth: Payer: Self-pay | Admitting: Family Medicine

## 2016-10-29 NOTE — Telephone Encounter (Signed)
PA approved for Advair Diskus 500-50 via [place] [employer] until 10/29/2017.  [facility] is aware of approval. Derl Barrow, RN

## 2016-10-29 NOTE — Telephone Encounter (Signed)
Pharmacy says they are waiting on medicaid to approve the RX for Advair.  Medicaid says they are waiting on the RX.  [facility] is [facility].  Please advise.

## 2016-11-23 ENCOUNTER — Ambulatory Visit (INDEPENDENT_AMBULATORY_CARE_PROVIDER_SITE_OTHER): Payer: Medicaid Other | Admitting: Internal Medicine

## 2016-11-23 ENCOUNTER — Encounter: Payer: Self-pay | Admitting: Internal Medicine

## 2016-11-23 VITALS — BP 120/90 | HR 110 | Ht 68.0 in | Wt 160.0 lb

## 2016-11-23 DIAGNOSIS — R0602 Shortness of breath: Secondary | ICD-10-CM

## 2016-11-23 DIAGNOSIS — F17210 Nicotine dependence, cigarettes, uncomplicated: Secondary | ICD-10-CM | POA: Diagnosis not present

## 2016-11-23 DIAGNOSIS — J439 Emphysema, unspecified: Secondary | ICD-10-CM

## 2016-11-23 DIAGNOSIS — Q8501 Neurofibromatosis, type 1: Secondary | ICD-10-CM | POA: Diagnosis not present

## 2016-11-23 MED ORDER — ACETAMINOPHEN-CODEINE #3 300-30 MG PO TABS: 1.0000 | ORAL_TABLET | ORAL | 1 refills | Status: DC | PRN

## 2016-11-23 MED ORDER — OMEPRAZOLE 40 MG PO CPDR: 40.0000 mg | DELAYED_RELEASE_CAPSULE | Freq: Every day | ORAL | 2 refills | Status: DC

## 2016-11-23 MED ORDER — FAMOTIDINE 20 MG PO TABS: ORAL_TABLET | ORAL | 2 refills | Status: DC

## 2016-11-23 NOTE — Progress Notes (Signed)
Subjective:     Patient ID: James [name], male   DOB: [birth year],     MRN: [id number]  HPI  68 yobm active smoker with new onset cough/ sob fall 2017 initially intermittent then more persistent since spring 2018 and rx with advair no better so referred to pulmonary clinic 11/23/2016 by Dr   Kennon Rounds    11/23/2016 1st [facility] office visit/ James [name]   Chief Complaint  Patient presents with  . Pulmonary Consult    Referred by Dr. Jeannetta Nap for eval of COPD. Pt states he has been coughing for the past year. He occ will produce some clear sputum. He also c/o SOB-occurs when he lies down flat and with exertion such as playing with his kids. He states he is currently out of proair, but before he ran out he was using it approx 3 x per day.   for the last 9-10 m starts with choking spells immediately  if lies down at hs and doe = MMRC2 = can't walk a nl pace on a flat grade s sob but does fine slow and flat  Did have overt hb better on ppi q am but only on prilosec 20   No obvious day to day or daytime variability or assoc excess/ purulent sputum or mucus plugs or hemoptysis or cp or chest tightness, subjective wheeze or overt sinus   symptoms. No unusual exp hx or h/o childhood pna/ asthma or knowledge of premature birth.  Sleeping ok without nocturnal  or early am exacerbation  of respiratory  c/o's or need for noct saba. Also denies any obvious fluctuation of symptoms with weather or environmental changes or other aggravating or alleviating factors except as outlined above   Current Medications, Allergies, Complete Past Medical History, Past Surgical History, Family History, and Social History were reviewed in [employer] record.  ROS  The following are not active complaints unless bolded sore throat, dysphagia, dental problems, itching, sneezing,  nasal congestion or excess/ purulent secretions, ear ache,   fever, chills, sweats, unintended wt loss, classically pleuritic  or exertional cp,  orthopnea pnd or leg swelling, presyncope, palpitations, abdominal pain, anorexia, nausea, vomiting, diarrhea  or change in bowel or bladder habits, change in stools or urine, dysuria,hematuria,  rash, arthralgias, visual complaints, headache, numbness, weakness or ataxia or problems with walking or coordination,  change in mood/affect or memory.         Review of Systems     Objective:   Physical Exam  amb bm   Wt Readings from Last 3 Encounters:  11/23/16 160 lb (72.6 kg)  10/24/16 158 lb (71.7 kg)  09/05/16 152 lb 12.8 oz (69.3 kg)    Vital signs reviewed - Note on arrival 02 sats  100% on RA    HEENT: nl dentition, turbinates bilaterally, and oropharynx. Nl external ear canals without cough reflex   NECK :  without JVD/Nodes/TM/ nl carotid upstrokes bilaterally   LUNGS: no acc muscle use,  Nl contour chest which is clear to A and P bilaterally without cough on insp or exp maneuvers   CV:  RRR  no s3 or murmur or increase in P2, and no edema   ABD:  soft and nontender with nl inspiratory excursion in the supine position> severe coughing fit almost immediately when supine -  No bruits or organomegaly appreciated, bowel sounds nl  MS:  Nl gait/ ext warm without deformities, calf tenderness, cyanosis or clubbing No obvious joint restrictions  SKIN: warm and dry without lesions    NEURO:  alert, approp, nl sensorium with  no motor or cerebellar deficits apparent.     I personally reviewed images and agree with radiology impression as follows:   Chest CTa  02/10/16 1. No pulmonary embolus or acute abnormality of the chest. 2. Emphysema. 3. Soft tissue mass of the left hemithoracic lateral pleural surface, between the second and third ribs. Size is increased compared to the 2013 examination. 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          Assessment:

## 2016-11-23 NOTE — Assessment & Plan Note (Signed)
No treatment needed as there is no impact on lung function, advised

## 2016-11-23 NOTE — Patient Instructions (Addendum)
Stop advair    Only use your albuterol as a rescue medication to be used if you can't catch your breath by resting or doing a relaxed purse lip breathing pattern.  - The less you use it, the better it will work when you need it. - Ok to use up to 2 puffs  every 4 hours if you must but call for immediate appointment if use goes up over your usual need - Don't leave home without it !!  (think of it like the spare tire for your car)   You do not have any significant copd now and you never will if you start smoking now   You have a problem with your throat that the advair first and is probably related to your throat (reflex to reflux)   The treatment for now should be maximum acid suppression/ cough suppression and diet   Omeprazole 40 mg Take 30-60 min before first meal of the day and pepcid 20 mg one at bedtime   For cough > Tylenol 3#  One every 4 hours   For drainage / throat tickle try take CHLORPHENIRAMINE  4 mg - take one every 4 hours as needed - available over the counter- may cause drowsiness so start with just a bedtime dose or two and see how you tolerate it before trying in daytime  (if not able to tolerate go back to Zyrtec)   The key is to stop smoking completely before smoking completely stops you!

## 2016-11-23 NOTE — Assessment & Plan Note (Addendum)
Chest CTa 02/10/16 1. No pulmonary embolus or acute abnormality of the chest. 2. Emphysema. Spirometry 11/23/2016  Wnl with nl curvature on advair 500 with upper airway gagging > try off and rx max gerd  - 11/23/2016  Walked RA x 3 laps @ 185 ft each stopped due to  End of study, nl to fast  pace, no sob or desat  But gagging cough developed at end    Symptoms are markedly disproportionate to objective findings and not clear this is actually much of a  lung problem but pt does appear to have difficult to sort out respiratory symptoms of unknown origin for which  DDX  = almost all start with A and  include Adherence, Ace Inhibitors, Acid Reflux, Active Sinus Disease, Alpha 1 Antitripsin deficiency, Anxiety masquerading as Airways dz,  ABPA,  Allergy(esp in young), Aspiration (esp in elderly), Adverse effects of meds,  Active smokers, A bunch of PE's (a small clot burden can't cause this syndrome unless there is already severe underlying pulm or vascular dz with poor reserve) plus two Bs  = Bronchiectasis and Beta blocker use..and one C= CHF    Adherence is always the initial "prime suspect" and is a multilayered concern that requires a "trust but verify" approach in every patient - starting with knowing how to use medications, especially inhalers, correctly, keeping up with refills and understanding the fundamental difference between maintenance and prns vs those medications only taken for a very short course and then stopped and not refilled.  - - The proper method of use, as well as anticipated side effects, of a metered-dose inhaler are discussed and demonstrated to the patient.    Active smoking at top of the usual list of suspects (see separate a/p)   ? Acid (or non-acid) GERD > always difficult to exclude as up to 75% of pts in some series report no assoc GI/ Heartburn symptoms> rec max (24h)  acid suppression and diet restrictions/ reviewed and instructions given in writing.   ? Adverse effects  of dpi > try off advair and just use prn saba   ? Anxiety > usually at the bottom of this list of usual suspects but should be much higher on this pt's based on H and P and note has been on psychotropics in past   ? Allergy/ asthma > nothing at all to suggest at this point but ok to use saba prn   No pulmonary f/u needed   Total time devoted to counseling  > 50 % of initial 60 min office visit:  review case with pt/ discussion of options/alternatives/ personally creating written customized instructions  in presence of pt  then going over those specific  Instructions directly with the pt including how to use all of the meds but in particular covering each new medication in detail and the difference between the maintenance= "automatic" meds and the prns using an action plan format for the latter (If this problem/symptom => do that organization reading Left to right).  Please see AVS from this visit for a full list of these instructions which I personally wrote for this pt and  are unique to this visit.

## 2016-11-23 NOTE — Assessment & Plan Note (Signed)
Referred back to [facility] and in meantime refilled Tylenol #3 x once

## 2016-11-23 NOTE — Assessment & Plan Note (Addendum)
CTa 02/10/16 with emphysematous changes upper lobes but nl spirometry 11/23/2016   > 3 min discussion I reviewed the Fletcher curve with the patient that basically indicates  if you quit smoking when your best day FEV1 is still well preserved (as is clearly  the case here)  it is highly unlikely you will progress to severe disease and informed the patient there was  no medication on the market that has proven to alter the curve/ its downward trajectory  or the likelihood of progression of their disease(unlike other chronic medical conditions such as atheroclerosis where we do think we can change the natural hx with risk reducing meds)    Therefore stopping smoking and maintaining abstinence is the most important aspect of care, not choice of inhalers or for that matter, doctors.

## 2016-11-26 ENCOUNTER — Other Ambulatory Visit: Payer: Self-pay | Admitting: *Deleted

## 2016-11-26 ENCOUNTER — Other Ambulatory Visit: Payer: Self-pay | Admitting: Family Medicine

## 2016-11-26 MED ORDER — ALBUTEROL SULFATE HFA 108 (90 BASE) MCG/ACT IN AERS: INHALATION_SPRAY | RESPIRATORY_TRACT | 1 refills | Status: DC

## 2017-02-18 NOTE — Progress Notes (Signed)
Subjective:   Patient ID: James [name]    DOB: September 09, 1975, 41 y.o. male   MRN: [id number]  James [name] is a 41 y.o. male with a history of Neurofibromatosis type 1 here for worsening back pain over the last week.  #Neurofibromatosis - Previously seen at [facility] clinic but has been a while and was told they couldn't do anything else for him. Noted he was supposed to get a referral to a surgeon, but has not happened yet. Would like to be seen elsewhere. - Last MRI 2016. Per previous notes, should have MRI every 6-12 months to assess progression of disease. 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  #Back pain - Chronic in nature from neurofibromatosis, but recent worsening this past week.  States it lasts all day, sharp in character, starts at neck and radiates down back to both legs. Has tried Tylenol #3 and wife's gabapentin with no relief. Heating pad helps some.  Standing for long periods of time, running after child, lifting makes it worse. Currently 8/10. Pain is inhibiting him from playing with his kids. - Denies red flag symptoms - no fevers, night sweats, recent unexplained weight loss, bowel/bladder loss, persistent bilateral numbness.  Review of Systems:  Per HPI. Also, denies chest pain, SOB.  [place]: reviewed. Smoking status reviewed. Medications reviewed.  Objective:   BP 110/80   Pulse (!) 106   Temp 98.7 F (37.1 C) (Oral)   Wt 155 lb (70.3 kg)   SpO2 93%   BMI 23.57 kg/m  Vitals and nursing note reviewed.  General: well nourished, well developed, in no acute distress with non-toxic appearance HEENT: normocephalic, atraumatic, moist mucous membranes Neck: supple, non-tender without lymphadenopathy, full ROM without pain CV: regular rate and rhythm without murmurs, rubs, or gallops Lungs: clear  to auscultation bilaterally with normal work of breathing Skin: warm, dry, no rashes or lesions Extremities: warm and well perfused, normal tone MSK: Slow to move with ROM, gait normal. strength 5/5 U/LE. Grip strength intact. Tender to palpation to light touch down midline of back. No muscular spasms noted. Smile and shoulder shrug symmetric. Able to bend at waist but with pain. Neuro: Alert and oriented, speech normal  Assessment & Plan:   Neurofibromatosis, type 1 (von Recklinghausen's disease) ([place]) Previously seen at [facility] clinic but wants to be seen elsewhere. Last MRI 2016. CTA chest 01/2016 showed increase in soft tissue mass btwn 2nd and 3rd ribs. Continues to endorse chronic back pain, worsened this past week. - referral to NF clinic - MRI Lumbar spine - referral to neurosurgery   Back pain Worsening over the past week. No red flag symptoms. With new worsening back pain and due for progression follow up MRI, will get MRI lumbar. - refill Tylenol #3 - Cymbalta 30mg  daily - MRI lumbar spine - referral to neurosurgeon - PT  Orders Placed This Encounter  Procedures  . MR Lumbar Spine W Wo Contrast    Standing Status:   Future    Standing Expiration Date:   04/21/2018    Order Specific Question:   If indicated for the ordered procedure, I authorize the administration of contrast media per Radiology protocol    Answer:   Yes    Order Specific Question:   What is the patient's sedation requirement?  Answer:   No Sedation    Order Specific Question:   Does the patient have a pacemaker or implanted devices?    Answer:   No    Order Specific Question:   Radiology Contrast Protocol - do NOT remove file path    Answer:   [email or web address]    Order Specific Question:   Preferred imaging location?    Answer:   [facility] (table limit-500 lbs)  . Flu Vaccine QUAD 36+ mos IM  . Ambulatory referral to Physical Therapy    Referral Priority:    Routine    Referral Type:   Physical Medicine    Referral Reason:   Specialty Services Required    Requested Specialty:   Physical Therapy    Number of Visits Requested:   1  . Ambulatory referral to Neurosurgery    Referral Priority:   Routine    Referral Type:   Surgical    Referral Reason:   Specialty Services Required    Requested Specialty:   Neurosurgery    Number of Visits Requested:   1   Meds ordered this encounter  Medications  . DULoxetine (CYMBALTA) 30 MG capsule    Sig: Take 1 capsule (30 mg total) by mouth daily.    Dispense:  90 capsule    Refill:  3  . acetaminophen-codeine (TYLENOL #3) 300-30 MG tablet    Sig: Take 1 tablet by mouth every 4 (four) hours as needed for moderate pain.    Dispense:  40 tablet    Refill:  [place], DO PGY-1, [facility] 02/19/2017 3:24 PM

## 2017-02-19 ENCOUNTER — Encounter: Payer: Self-pay | Admitting: Family Medicine

## 2017-02-19 ENCOUNTER — Ambulatory Visit (INDEPENDENT_AMBULATORY_CARE_PROVIDER_SITE_OTHER): Payer: Medicaid Other | Admitting: Family Medicine

## 2017-02-19 VITALS — BP 110/80 | HR 106 | Temp 98.7°F | Wt 155.0 lb

## 2017-02-19 DIAGNOSIS — Z23 Encounter for immunization: Secondary | ICD-10-CM | POA: Diagnosis not present

## 2017-02-19 DIAGNOSIS — Q8501 Neurofibromatosis, type 1: Secondary | ICD-10-CM

## 2017-02-19 DIAGNOSIS — M545 Low back pain: Secondary | ICD-10-CM

## 2017-02-19 DIAGNOSIS — Q8500 Neurofibromatosis, unspecified: Secondary | ICD-10-CM

## 2017-02-19 DIAGNOSIS — G8929 Other chronic pain: Secondary | ICD-10-CM | POA: Diagnosis not present

## 2017-02-19 MED ORDER — DULOXETINE HCL 30 MG PO CPEP: 30.0000 mg | ORAL_CAPSULE | Freq: Every day | ORAL | 3 refills | Status: DC

## 2017-02-19 MED ORDER — ACETAMINOPHEN-CODEINE #3 300-30 MG PO TABS: 1.0000 | ORAL_TABLET | ORAL | 1 refills | Status: DC | PRN

## 2017-02-19 NOTE — Assessment & Plan Note (Addendum)
Previously seen at [facility] NF clinic but wants to be seen elsewhere. Last MRI 2016. CTA chest 01/2016 showed increase in soft tissue mass btwn 2nd and 3rd ribs. Continues to endorse chronic back pain, worsened this past week. - referral to NF clinic - MRI Lumbar spine - referral to neurosurgery

## 2017-02-19 NOTE — Patient Instructions (Signed)
It was great to see you!  For your back pain,  - We are refilling your Tylenol with codeine - We are giving a new medication (Cymbalta) for nerve pain - We are scheduling you a back MRI, the nurse will give you the appointment time. - We are referring you to physical therapy - the referral nurse will give you a call with the appointment information. - We are referring you to a neurosurgeon for assessment of your back pain due to neurofibromatosis - I will check with our referral nurse about possible NF clinics in the area you can be established with and let you know once we find one.  Take care and seek immediate care sooner if you develop any concerns.   Rory Percy, DO [facility]  Neurofibromatosis, Adult Neurofibromatosis is a rare genetic disorder that causes the development of nerve tumors and skin and bone changes. The tumors are usually not cancerous (benign) and differ, depending on the type of neurofibromatosis present. There are three types of neurofibromatosis:  Neurofibromatosis 1 (NF1). This is the most common type.  Neurofibromatosis 2 (NF2).  Schwannomatosis. This is the least common type.  In most cases, neurofibromatosis slowly gets worse over time. What are the causes? Neurofibromatosis is caused by a change, or mutation, in genes that control the growth of nerve cells. You may have neurofibromatosis if:  You inherit mutated genes from your parents. Up to half of all cases of NF1 and NF2 are inherited.  Your genes mutate. It is not known why this happens. This is the most common cause of schwannomatosis.  What are the signs or symptoms? Your symptoms will depend on the type of neurofibromatosis you have. Signs and symptoms of NF1 usually begin in childhood. They may include:  Coffee-colored skin spots (cafe-au-lait spots).  Pea-sized lumps under the skin.  Freckles in the groin or under the arms.  Benign tumors (neurofibromas) attached to one or  more nerves.  Discolored specks in the colored part of the eye (iris).  Curvature of the spine or other bone deformities.  Headaches.  Seizures.  Learning disabilities.  Signs and symptoms of NF2 usually begin in early adulthood. They may include:  Benign tumors that develop on a nerve inside the ear (schwannoma).  Ringing in the ears.  Progressive or sudden hearing loss.  Balance problems or dizziness.  Headache.  Ear pressure or pain.  Facial pain on the affected side.  Vision changes.  Numbness or weakness of the face on the affected side.  Signs and symptoms of schwannomatosis usually do not show up until adulthood. They may include:  Long-term pain. This is the most common symptom.  Development of numerous tumors throughout the body except in the ear.  Numbness.  Tingling.  How is this diagnosed? Your health care provider can diagnose neurofibromatosis based on your symptoms and a physical exam. Your health care provider may also do tests to help make the diagnosis. These may include:  Blood tests.  Imaging studies such as an MRI or CT scan.  Hearing, vision, and balance tests.  Tests for learning disabilities.  It may take many years to be diagnosed with the disease because signs and symptoms may develop slowly. How is this treated? There is no cure for neurofibromatosis. Treatment may include:  Medicines to control pain or seizures.  Therapy for disabilities.  Surgery to remove tumors that cause symptoms or become disfiguring. There is a small chance that tumors in people with NF1 will  become cancerous. Tumors that become cancerous may need to be treated with a combination of: ? Surgical removal. ? Radiation treatments. ? Cancer drugs (chemotherapy).  Follow these instructions at home: Take medicines only as directed by your health care provider. Contact a health care provider if:  You develop any new symptoms.  Your symptoms get  worse. Get help right away if:  You have a seizure.  You have a severe headache.  You have a sudden change in vision, balance, or hearing. This information is not intended to replace advice given to you by your health care provider. Make sure you discuss any questions you have with your health care provider. Document Released: 12/09/2013 Document Revised: 10/20/2015 Document Reviewed: 07/08/2013 Elsevier Interactive Patient Education  [employer].

## 2017-02-19 NOTE — Assessment & Plan Note (Signed)
Worsening over the past week. No red flag symptoms. With new worsening back pain and due for progression follow up MRI, will get MRI lumbar. - refill Tylenol #3 - Cymbalta 30mg  daily - MRI lumbar spine - referral to neurosurgeon - PT

## 2017-02-26 ENCOUNTER — Ambulatory Visit (HOSPITAL_COMMUNITY): Payer: Medicaid Other

## 2017-02-27 ENCOUNTER — Ambulatory Visit (HOSPITAL_COMMUNITY)
Admission: RE | Admit: 2017-02-27 | Discharge: 2017-02-27 | Disposition: A | Discharge status: Home / Self Care | Payer: Medicaid Other | Source: Ambulatory Visit | Attending: Family Medicine | Admitting: Family Medicine

## 2017-02-27 ENCOUNTER — Ambulatory Visit: Payer: Medicaid Other | Attending: Family Medicine | Admitting: Physical Therapy

## 2017-02-27 DIAGNOSIS — M5137 Other intervertebral disc degeneration, lumbosacral region: Secondary | ICD-10-CM | POA: Diagnosis not present

## 2017-02-27 DIAGNOSIS — M5127 Other intervertebral disc displacement, lumbosacral region: Secondary | ICD-10-CM | POA: Diagnosis not present

## 2017-02-27 DIAGNOSIS — M899 Disorder of bone, unspecified: Secondary | ICD-10-CM | POA: Diagnosis not present

## 2017-02-27 DIAGNOSIS — Q8500 Neurofibromatosis, unspecified: Secondary | ICD-10-CM

## 2017-02-27 MED ORDER — GADOBENATE DIMEGLUMINE 529 MG/ML IV SOLN
15.0000 mL | Freq: Once | INTRAVENOUS | Status: AC | PRN
  Administered 2017-02-27: 15 mL via INTRAVENOUS

## 2017-02-28 ENCOUNTER — Telehealth: Payer: Self-pay | Admitting: *Deleted

## 2017-02-28 NOTE — Telephone Encounter (Signed)
-----   Message from Rory Percy, DO sent at 02/28/2017  8:55 AM EDT ----- Please let patient know that MRI of his lower back was largely unchanged from his previous MRI. Our referral nurse is working to get him referral to another neurofibromatosis clinic. Thanks!  Rory Percy, DO PGY-1, [facility] 02/28/2017 8:55 AM

## 2017-02-28 NOTE — Progress Notes (Signed)
Please let patient know that MRI of his lower back was largely unchanged from his previous MRI. Our referral nurse is working to get him referral to another neurofibromatosis clinic. Thanks!  Rory Percy, DO PGY-1, [facility] 02/28/2017 8:55 AM

## 2017-02-28 NOTE — Telephone Encounter (Signed)
Patient is voiced understanding on results and also referral process. James [name],CMA

## 2017-03-09 ENCOUNTER — Other Ambulatory Visit: Payer: Self-pay | Admitting: Internal Medicine

## 2017-03-28 ENCOUNTER — Other Ambulatory Visit: Payer: Self-pay | Admitting: Family Medicine

## 2017-04-21 ENCOUNTER — Emergency Department (HOSPITAL_COMMUNITY)
Admission: EM | Admit: 2017-04-21 | Discharge: 2017-04-21 | Disposition: A | Discharge status: Home / Self Care | Payer: Medicaid Other | Attending: Emergency Medicine | Admitting: Emergency Medicine

## 2017-04-21 ENCOUNTER — Other Ambulatory Visit: Payer: Self-pay

## 2017-04-21 ENCOUNTER — Emergency Department (HOSPITAL_COMMUNITY): Payer: Medicaid Other

## 2017-04-21 DIAGNOSIS — F17210 Nicotine dependence, cigarettes, uncomplicated: Secondary | ICD-10-CM | POA: Diagnosis not present

## 2017-04-21 DIAGNOSIS — J449 Chronic obstructive pulmonary disease, unspecified: Secondary | ICD-10-CM | POA: Insufficient documentation

## 2017-04-21 DIAGNOSIS — R079 Chest pain, unspecified: Secondary | ICD-10-CM | POA: Insufficient documentation

## 2017-04-21 DIAGNOSIS — I10 Essential (primary) hypertension: Secondary | ICD-10-CM | POA: Insufficient documentation

## 2017-04-21 DIAGNOSIS — R451 Restlessness and agitation: Secondary | ICD-10-CM | POA: Diagnosis present

## 2017-04-21 DIAGNOSIS — F410 Panic disorder [episodic paroxysmal anxiety] without agoraphobia: Secondary | ICD-10-CM | POA: Insufficient documentation

## 2017-04-21 DIAGNOSIS — Z79899 Other long term (current) drug therapy: Secondary | ICD-10-CM | POA: Insufficient documentation

## 2017-04-21 LAB — RAPID URINE DRUG SCREEN, HOSP PERFORMED
Amphetamines: NOT DETECTED
BARBITURATES: NOT DETECTED
Benzodiazepines: NOT DETECTED
COCAINE: NOT DETECTED
Opiates: NOT DETECTED
TETRAHYDROCANNABINOL: NOT DETECTED

## 2017-04-21 LAB — BASIC METABOLIC PANEL
Anion gap: 13 (ref 5–15)
BUN: 7 mg/dL (ref 6–20)
CALCIUM: 9.1 mg/dL (ref 8.9–10.3)
CHLORIDE: 106 mmol/L (ref 101–111)
CO2: 19 mmol/L — AB (ref 22–32)
CREATININE: 0.64 mg/dL (ref 0.61–1.24)
GFR calc Af Amer: >60 mL/min (ref >60)
GFR calc non Af Amer: >60 mL/min (ref >60)
GLUCOSE: 92 mg/dL (ref 65–99)
Potassium: 3.8 mmol/L (ref 3.5–5.1)
Sodium: 138 mmol/L (ref 135–145)

## 2017-04-21 LAB — CBC
HCT: 53.2 % — ABNORMAL HIGH (ref 39.0–52.0)
Hemoglobin: 18.4 g/dL — ABNORMAL HIGH (ref 13.0–17.0)
MCH: 33.4 pg (ref 26.0–34.0)
MCHC: 34.6 g/dL (ref 30.0–36.0)
MCV: 96.6 fL (ref 78.0–100.0)
PLATELETS: 204 10*3/uL (ref 150–400)
RBC: 5.51 MIL/uL (ref 4.22–5.81)
RDW: 12.2 % (ref 11.5–15.5)
WBC: 8 10*3/uL (ref 4.0–10.5)

## 2017-04-21 LAB — I-STAT TROPONIN, ED: TROPONIN I, POC: 0 ng/mL (ref 0.00–0.08)

## 2017-04-21 MED ORDER — HYDROXYZINE HCL 25 MG PO TABS
50.0000 mg | ORAL_TABLET | Freq: Once | ORAL | Status: AC
  Administered 2017-04-21: 50 mg via ORAL
  Filled 2017-04-21: qty 2

## 2017-04-21 MED ORDER — SODIUM CHLORIDE 0.9 % IV BOLUS (SEPSIS)
500.0000 mL | Freq: Once | INTRAVENOUS | Status: AC
  Administered 2017-04-21: 500 mL via INTRAVENOUS

## 2017-04-21 NOTE — ED Provider Notes (Signed)
[facility] EMERGENCY DEPARTMENT Provider Note   CSN: [id number] Arrival date & time: 04/21/17  0002     History   Chief Complaint Chief Complaint  Patient presents with  . feeling sick    HPI James [name] is a 41 y.o. male.  Patient comes to the emergency department for evaluation of agitation.  Patient reports that he had 3 shots of tequila at a friend's house.  He reports that there are other people there that he did not know well.  After he had the shots, he started to feel anxious and agitated.  He reports that he has never felt like this before.  He feels like there is some pain in his chest, no specific shortness of breath.  He is concerned that there might of been something in the drinks.      Past Medical History:  Diagnosis Date  . Black stools   . Bronchitis    11/2011  . COPD (chronic obstructive pulmonary disease) ([place])   . Difficulty sleeping   . Dizziness   . Former smoker   . GERD (gastroesophageal reflux disease)   . Hypertension goal BP (blood pressure) < 140/90 12/21/2011  . 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 Shortness of breath    with exertion  . Vomiting blood     Patient Active Problem List   Diagnosis Date Noted  . Emphysema lung ([place]) 10/24/2016  . Skin lesion 04/25/2015  . Cough 09/17/2014  . Healthcare maintenance 09/17/2014  . Cigarette smoker 05/11/2014  . 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 Vitamin D deficiency 08/21/2013  . Back pain 03/17/2012  . Hemoptysis 01/09/2012  . Oral mucosal lesion 01/01/2012  . Hypertension 12/21/2011  . 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 DOE (dyspnea on exertion) 12/06/2011    Past Surgical History:  Procedure Laterality Date  . ESOPHAGOGASTRODUODENOSCOPY (EGD) WITH PROPOFOL  05/14/2012   Procedure: ESOPHAGOGASTRODUODENOSCOPY (EGD) WITH PROPOFOL;  Surgeon: Arta Silence, MD;  Location: [facility] ENDOSCOPY;  Service:  Endoscopy;  Laterality: N/A;  . NO PAST SURGERIES    . none         Home Medications    Prior to Admission medications   Medication Sig Start Date End Date Taking? Authorizing Provider  acetaminophen-codeine (TYLENOL #3) 300-30 MG tablet Take 1 tablet by mouth every 4 (four) hours as needed for moderate pain. 02/19/17   Rory Percy, DO  albuterol (PROVENTIL HFA;VENTOLIN HFA) 108 (90 Base) MCG/ACT inhaler INHALE 2 PUFFS INTO THE LUNGS 2 TIMES DAILY AS NEEDED FOR WHEEZING OR SHORTNESS OF BREATH 03/28/17   Rory Percy, DO  DULoxetine (CYMBALTA) 30 MG capsule Take 1 capsule (30 mg total) by mouth daily. 02/19/17   Rory Percy, DO  famotidine (PEPCID) 20 MG tablet TAKE 1 TABLET BY MOUTH EVERYDAY AT BEDTIME 03/11/17   Tanda Rockers, MD  fluticasone [facility]) 50 MCG/ACT nasal spray Place 2 sprays into both nostrils daily. 05/03/16   Vivi Barrack, MD  hydrochlorothiazide (MICROZIDE) 12.5 MG capsule TAKE 1 CAPSULE DAILY. 08/31/16   Vivi Barrack, MD  omeprazole (PRILOSEC) 40 MG capsule Take 1 capsule (40 mg total) by mouth daily. 11/23/16   Tanda Rockers, MD    Family History Family History  Problem Relation Age of Onset  . Diabetes Mother        and father  . Hypertension Mother        and father  . Hypertension  Father   . Diabetes Father   . Neurofibromatosis Brother        deceased  . Neurofibromatosis Daughter   . Neurofibromatosis Daughter     Social History Social History   Tobacco Use  . Smoking status: Current Every Day Smoker    Packs/day: 0.50    Years: 17.00    Pack years: 8.50    Types: Cigarettes  . Smokeless tobacco: Never Used  Substance Use Topics  . Alcohol use: No    Alcohol/week: 0.0 oz    Comment: quit: 05/2012  . Drug use: No     Allergies   Patient has no known allergies.   Review of Systems Review of Systems  Cardiovascular: Positive for chest pain.  Psychiatric/Behavioral: Positive for agitation. The patient is nervous/anxious.    All other systems reviewed and are negative.    Physical Exam Updated Vital Signs BP (!) 128/96   Pulse 100   Temp (!) 97.5 F (36.4 C) (Oral)   Resp (!) 22   SpO2 95%   Physical Exam  Constitutional: He is oriented to person, place, and time. He appears well-developed and well-nourished. No distress.  HENT:  Head: Normocephalic and atraumatic.  Right Ear: Hearing normal.  Left Ear: Hearing normal.  Nose: Nose normal.  Mouth/Throat: Oropharynx is clear and moist and mucous membranes are normal.  Eyes: Conjunctivae and EOM are normal. Pupils are equal, round, and reactive to light.  Neck: Normal range of motion. Neck supple.  Cardiovascular: Regular rhythm, S1 normal and S2 normal. Exam reveals no gallop and no friction rub.  No murmur heard. Pulmonary/Chest: Effort normal and breath sounds normal. No respiratory distress. He exhibits no tenderness.  Abdominal: Soft. Normal appearance and bowel sounds are normal. There is no hepatosplenomegaly. There is no tenderness. There is no rebound, no guarding, no tenderness at McBurney's point and negative Murphy's sign. No hernia.  Musculoskeletal: Normal range of motion.  Neurological: He is alert and oriented to person, place, and time. He has normal strength. No cranial nerve deficit or sensory deficit. Coordination normal. GCS eye subscore is 4. GCS verbal subscore is 5. GCS motor subscore is 6.  Skin: Skin is warm, dry and intact. No rash noted. No cyanosis.  Psychiatric: His speech is normal and behavior is normal. Thought content normal. His mood appears anxious.  Nursing note and vitals reviewed.    ED Treatments / Results  Labs (all labs ordered are listed, but only abnormal results are displayed) Labs Reviewed  BASIC METABOLIC PANEL - Abnormal; Notable for the following components:      Result Value   CO2 19 (*)    All other components within normal limits  CBC - Abnormal; Notable for the following components:    Hemoglobin 18.4 (*)    HCT 53.2 (*)    All other components within normal limits  RAPID URINE DRUG SCREEN, HOSP PERFORMED  I-STAT TROPONIN, ED    EKG  EKG Interpretation  Date/Time:  Sunday April 21 2017 00:08:31 EST Ventricular Rate:  84 PR Interval:    QRS Duration: 89 QT Interval:  374 QTC Calculation: 443 R Axis:   65 Text Interpretation:  Sinus rhythm ST elev, probable normal early repol pattern Otherwise within normal limits Confirmed by Orpah Greek [phone]) on 04/21/2017 12:23:43 AM       Radiology Dg Chest 2 View  Result Date: 04/21/2017 CLINICAL DATA:  Patient is not feeling well after 3 shots of tequila. Chronic chest pain.  EXAM: CHEST  2 VIEW COMPARISON:  Chest 02/09/2016.  CT chest 02/09/2016. FINDINGS: Extra pulmonary nodule in the left upper chest laterally measuring 19 mm diameter. No change since the previous studies. Heart size and pulmonary vascularity are normal. Emphysematous changes and scarring in the lung apices. Peribronchial thickening with central interstitial changes likely chronic bronchitis. No consolidation or airspace disease. No blunting of costophrenic angles. No pneumothorax. IMPRESSION: Emphysematous changes and chronic bronchitic changes in the lungs with scarring in the lung apices. Unchanged appearance of the extra pulmonary nodule in the left upper chest. No evidence of active consolidation. Electronically Signed   By: Lucienne Capers M.D.   On: 04/21/2017 00:51    Procedures Procedures (including critical care time)  Medications Ordered in ED Medications  sodium chloride 0.9 % bolus 500 mL (0 mLs Intravenous Stopped 04/21/17 0204)  hydrOXYzine (ATARAX/VISTARIL) tablet 50 mg (50 mg Oral Given 04/21/17 0111)     Initial Impression / Assessment and Plan / ED Course  I have reviewed the triage vital signs and the nursing notes.  Pertinent labs & imaging results that were available during my care of the patient were reviewed  by me and considered in my medical decision making (see chart for details).     Patient presented with severe anxiety and agitation.  He reports doing shots of tequila just prior to onset of symptoms.  He was concerned that he might have inadvertently ingested some kind of a drug in the drinks, as he did not know everybody that he was drinking with.  He started to have chest pain which is likely secondary to anxiety and panic attack.  Heart score is 1.  Workup is unremarkable, EKG does not show any signs of ischemia or infarct, troponin negative.  Patient administered hydroxyzine and his symptoms have resolved.  Patient not experiencing any pleuritic chest pain, no hypoxia, no unilateral swelling or PE risk factors.  Final Clinical Impressions(s) / ED Diagnoses   Final diagnoses:  Panic attack    ED Discharge Orders    None       Ronin Rehfeldt, Gwenyth Allegra, MD 04/21/17 [phone]

## 2017-04-21 NOTE — ED Notes (Signed)
Pt provided with water

## 2017-04-21 NOTE — ED Triage Notes (Signed)
Pt brought to ED by [facility] from home for not feeling well after having 3 shots of Tequila, pt states he has chronic cp. Pt refuses to have IV done by EMS. AO x 3, NAD noticed.  BP 128/92 HR80 R-18 SPO2 95%

## 2017-06-02 ENCOUNTER — Emergency Department (HOSPITAL_COMMUNITY)
Admission: EM | Admit: 2017-06-02 | Discharge: 2017-06-02 | Disposition: A | Discharge status: Home / Self Care | Payer: Medicaid Other | Attending: Emergency Medicine | Admitting: Emergency Medicine

## 2017-06-02 ENCOUNTER — Other Ambulatory Visit: Payer: Self-pay

## 2017-06-02 ENCOUNTER — Encounter (HOSPITAL_COMMUNITY): Payer: Self-pay | Admitting: *Deleted

## 2017-06-02 DIAGNOSIS — Z79899 Other long term (current) drug therapy: Secondary | ICD-10-CM | POA: Insufficient documentation

## 2017-06-02 DIAGNOSIS — M545 Low back pain: Secondary | ICD-10-CM | POA: Diagnosis not present

## 2017-06-02 DIAGNOSIS — F17210 Nicotine dependence, cigarettes, uncomplicated: Secondary | ICD-10-CM | POA: Diagnosis not present

## 2017-06-02 DIAGNOSIS — J449 Chronic obstructive pulmonary disease, unspecified: Secondary | ICD-10-CM | POA: Insufficient documentation

## 2017-06-02 DIAGNOSIS — F10920 Alcohol use, unspecified with intoxication, uncomplicated: Secondary | ICD-10-CM | POA: Insufficient documentation

## 2017-06-02 DIAGNOSIS — I10 Essential (primary) hypertension: Secondary | ICD-10-CM | POA: Insufficient documentation

## 2017-06-02 HISTORY — DX: Alcohol abuse, uncomplicated: F10.10

## 2017-06-02 LAB — COMPREHENSIVE METABOLIC PANEL
ALBUMIN: 4.1 g/dL (ref 3.5–5.0)
ALT: 52 U/L (ref 17–63)
AST: 89 U/L — AB (ref 15–41)
Alkaline Phosphatase: 107 U/L (ref 38–126)
Anion gap: 13 (ref 5–15)
CHLORIDE: 103 mmol/L (ref 101–111)
CO2: 23 mmol/L (ref 22–32)
Calcium: 8.8 mg/dL — ABNORMAL LOW (ref 8.9–10.3)
Creatinine, Ser: 0.75 mg/dL (ref 0.61–1.24)
GFR calc Af Amer: >60 mL/min (ref >60)
GFR calc non Af Amer: >60 mL/min (ref >60)
GLUCOSE: 92 mg/dL (ref 65–99)
POTASSIUM: 3.3 mmol/L — AB (ref 3.5–5.1)
Sodium: 139 mmol/L (ref 135–145)
TOTAL PROTEIN: 7.8 g/dL (ref 6.5–8.1)
Total Bilirubin: 0.6 mg/dL (ref 0.3–1.2)

## 2017-06-02 LAB — URINALYSIS, ROUTINE W REFLEX MICROSCOPIC
Bilirubin Urine: NEGATIVE
GLUCOSE, UA: NEGATIVE mg/dL
Hgb urine dipstick: NEGATIVE
Ketones, ur: 20 mg/dL — AB
LEUKOCYTES UA: NEGATIVE
NITRITE: NEGATIVE
PH: 5 (ref 5.0–8.0)
Protein, ur: NEGATIVE mg/dL
SPECIFIC GRAVITY, URINE: 1.015 (ref 1.005–1.030)

## 2017-06-02 LAB — CBC WITH DIFFERENTIAL/PLATELET
BASOS ABS: 0 10*3/uL (ref 0.0–0.1)
BASOS PCT: 1 %
Eosinophils Absolute: 0.1 10*3/uL (ref 0.0–0.7)
Eosinophils Relative: 1 %
HEMATOCRIT: 50.8 % (ref 39.0–52.0)
HEMOGLOBIN: 18.1 g/dL — AB (ref 13.0–17.0)
LYMPHS PCT: 12 %
Lymphs Abs: 1.1 10*3/uL (ref 0.7–4.0)
MCH: 33.9 pg (ref 26.0–34.0)
MCHC: 35.6 g/dL (ref 30.0–36.0)
MCV: 95.1 fL (ref 78.0–100.0)
MONO ABS: 0.4 10*3/uL (ref 0.1–1.0)
Monocytes Relative: 4 %
NEUTROS ABS: 7.1 10*3/uL (ref 1.7–7.7)
Neutrophils Relative %: 82 %
Platelets: 264 10*3/uL (ref 150–400)
RBC: 5.34 MIL/uL (ref 4.22–5.81)
RDW: 12.4 % (ref 11.5–15.5)
WBC: 8.6 10*3/uL (ref 4.0–10.5)

## 2017-06-02 LAB — RAPID URINE DRUG SCREEN, HOSP PERFORMED
Amphetamines: NOT DETECTED
BARBITURATES: NOT DETECTED
BENZODIAZEPINES: NOT DETECTED
COCAINE: NOT DETECTED
Opiates: NOT DETECTED
Tetrahydrocannabinol: POSITIVE — AB

## 2017-06-02 LAB — ETHANOL: ALCOHOL ETHYL (B): 240 mg/dL — AB (ref <10)

## 2017-06-02 NOTE — ED Notes (Signed)
Requested urine from patient. 

## 2017-06-02 NOTE — ED Provider Notes (Signed)
[place] DEPT Provider Note   CSN: [id number] Arrival date & time: 06/02/17  1612     History   Chief Complaint Chief Complaint  Patient presents with  . Alcohol Intoxication    HPI James [name] is a 42 y.o. male.  He is here for evaluation of "alcohol intoxication, and back pain.".  He presents by EMS.  His wife told EMS that he was scheduled to start "detox, " tomorrow.  He is not sure what he drank today or how long his back is been hurting.  He is a poor historian.  Reportedly he was hypotensive when EMS found him, but that is improved now.  He denies falling, chest pain, headache, neck pain or extremity pain.  He does not recall having any other illnesses recently.  There are no other known modifying factors.  HPI  Past Medical History:  Diagnosis Date  . Black stools   . Bronchitis    11/2011  . COPD (chronic obstructive pulmonary disease) ([place])   . Difficulty sleeping   . Dizziness   . ETOH abuse   . Former smoker   . GERD (gastroesophageal reflux disease)   . Hypertension goal BP (blood pressure) < 140/90 12/21/2011  . 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 Shortness of breath    with exertion  . Vomiting blood     Patient Active Problem List   Diagnosis Date Noted  . Emphysema lung ([place]) 10/24/2016  . Skin lesion 04/25/2015  . Cough 09/17/2014  . Healthcare maintenance 09/17/2014  . Cigarette smoker 05/11/2014  . 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 Vitamin D deficiency 08/21/2013  . Back pain 03/17/2012  . Hemoptysis 01/09/2012  . Oral mucosal lesion 01/01/2012  . Hypertension 12/21/2011  . 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 DOE (dyspnea on exertion) 12/06/2011    Past Surgical History:  Procedure Laterality Date  . ESOPHAGOGASTRODUODENOSCOPY (EGD) WITH PROPOFOL  05/14/2012   Procedure: ESOPHAGOGASTRODUODENOSCOPY (EGD) WITH PROPOFOL;  Surgeon: Arta Silence,  MD;  Location: [facility];  Service: Endoscopy;  Laterality: N/A;  . NO PAST SURGERIES    . none         Home Medications    Prior to Admission medications   Medication Sig Start Date End Date Taking? Authorizing Provider  acetaminophen-codeine (TYLENOL #3) 300-30 MG tablet Take 1 tablet by mouth every 4 (four) hours as needed for moderate pain. 02/19/17   Rory Percy, DO  albuterol (PROVENTIL HFA;VENTOLIN HFA) 108 (90 Base) MCG/ACT inhaler INHALE 2 PUFFS INTO THE LUNGS 2 TIMES DAILY AS NEEDED FOR WHEEZING OR SHORTNESS OF BREATH 03/28/17   Rory Percy, DO  DULoxetine (CYMBALTA) 30 MG capsule Take 1 capsule (30 mg total) by mouth daily. 02/19/17   Rory Percy, DO  famotidine (PEPCID) 20 MG tablet TAKE 1 TABLET BY MOUTH EVERYDAY AT BEDTIME 03/11/17   Tanda Rockers, MD  fluticasone [facility]) 50 MCG/ACT nasal spray Place 2 sprays into both nostrils daily. 05/03/16   Vivi Barrack, MD  hydrochlorothiazide (MICROZIDE) 12.5 MG capsule TAKE 1 CAPSULE DAILY. 08/31/16   Vivi Barrack, MD  omeprazole (PRILOSEC) 40 MG capsule Take 1 capsule (40 mg total) by mouth daily. 11/23/16   Tanda Rockers, MD    Family History Family History  Problem Relation Age of Onset  . Diabetes Mother        and father  . Hypertension Mother  and father  . Hypertension Father   . Diabetes Father   . Neurofibromatosis Brother        deceased  . Neurofibromatosis Daughter   . Neurofibromatosis Daughter     Social History Social History   Tobacco Use  . Smoking status: Current Every Day Smoker    Packs/day: 0.50    Years: 17.00    Pack years: 8.50    Types: Cigarettes  . Smokeless tobacco: Never Used  Substance Use Topics  . Alcohol use: Yes  . Drug use: No     Allergies   Patient has no known allergies.   Review of Systems Review of Systems  All other systems reviewed and are negative.    Physical Exam Updated Vital Signs BP 118/72 (BP Location: Right Arm)   Pulse 89    Temp 98.1 F (36.7 C) (Oral)   Resp 12   Ht 5\' 7"  (1.702 m)   Wt 60.3 kg (133 lb)   SpO2 98%   BMI 20.83 kg/m   Physical Exam  Constitutional: He is oriented to person, place, and time. He appears well-developed and well-nourished. No distress.  HENT:  Head: Normocephalic and atraumatic.  Right Ear: External ear normal.  Left Ear: External ear normal.  Eyes: Conjunctivae and EOM are normal. Pupils are equal, round, and reactive to light.  Neck: Normal range of motion and phonation normal. Neck supple.  Cardiovascular: Normal rate, regular rhythm and normal heart sounds.  Pulmonary/Chest: Effort normal and breath sounds normal. No stridor. No respiratory distress. He exhibits no bony tenderness.  Abdominal: Soft. There is no tenderness.  Musculoskeletal:  Mild left lumbar tenderness to palpation.  Normal range of motion arms and legs.  Neurological: He is alert and oriented to person, place, and time. No cranial nerve deficit or sensory deficit. He exhibits normal muscle tone. Coordination normal.  Dysarthria consistent with alcohol intoxication.  No aphasia or nystagmus.  Skin: Skin is warm, dry and intact.  Psychiatric: He has a normal mood and affect. His behavior is normal. Judgment and thought content normal.  Nursing note and vitals reviewed.    ED Treatments / Results  Labs (all labs ordered are listed, but only abnormal results are displayed) Labs Reviewed  COMPREHENSIVE METABOLIC PANEL - Abnormal; Notable for the following components:      Result Value   Potassium 3.3 (*)    BUN <5 (*)    Calcium 8.8 (*)    AST 89 (*)    All other components within normal limits  ETHANOL - Abnormal; Notable for the following components:   Alcohol, Ethyl (B) 240 (*)    All other components within normal limits  CBC WITH DIFFERENTIAL/PLATELET - Abnormal; Notable for the following components:   Hemoglobin 18.1 (*)    All other components within normal limits  URINALYSIS, ROUTINE  W REFLEX MICROSCOPIC - Abnormal; Notable for the following components:   Ketones, ur 20 (*)    All other components within normal limits  RAPID URINE DRUG SCREEN, HOSP PERFORMED - Abnormal; Notable for the following components:   Tetrahydrocannabinol POSITIVE (*)    All other components within normal limits    EKG  EKG Interpretation None       Radiology No results found.  Procedures Procedures (including critical care time)  Medications Ordered in ED Medications - No data to display   Initial Impression / Assessment and Plan / ED Course  I have reviewed the triage vital signs and the nursing  notes.  Pertinent labs & imaging results that were available during my care of the patient were reviewed by me and considered in my medical decision making (see chart for details).      Patient Vitals for the past 24 hrs:  BP Temp Temp src Pulse Resp SpO2 Height Weight  06/02/17 2114 118/72 98.1 F (36.7 C) Oral 89 12 98 % - -  06/02/17 1838 119/80 - - 97 - - - -  06/02/17 1805 116/79 - - 91 13 96 % - -  06/02/17 1624 114/71 - - 97 16 (!) 89 % - -  06/02/17 1623 - - - - - - 5\' 7"  (1.702 m) 60.3 kg (133 lb)    At discharge- reevaluation with update and discussion. After initial assessment and treatment, an updated evaluation reveals patient is comfortable has no further complaints.  Findings discussed with the patient.  Patient was sitting up on the bed, lucid and cooperative.  He is not exhibiting ataxia or dysarthria.  He left prior to receiving discharge instructions. Daleen Bo      Final Clinical Impressions(s) / ED Diagnoses   Final diagnoses:  Alcoholic intoxication without complication ([place])    Alcohol intoxication, without apparent complication.  Patient is apparently scheduled to go to "detox," tomorrow.  Nursing Notes Reviewed/ Care Coordinated Applicable Imaging Reviewed Interpretation of Laboratory Data incorporated into ED treatment  The patient appears  reasonably screened and/or stabilized for discharge and I doubt any other medical condition or other [facility] requiring further screening, evaluation, or treatment in the ED at this time prior to discharge.  Plan: Home Medications-routine OTC medications; Home Treatments-stop drinking alcohol; return here if the recommended treatment, does not improve the symptoms; Recommended follow up-PCP, as needed    ED Discharge Orders    None       Daleen Bo, MD 06/03/17 [phone]

## 2017-06-02 NOTE — ED Triage Notes (Addendum)
EMS stated pts wife called due to pt vomiting, pt admits he drank a gallon "dark Liquor" . Multiple types. Pt hypotensive 80/p now 100/p, pt alert, not alert to time. According to wife, EMS was told pt is to start detox. Unsure and unable to confirm

## 2017-06-02 NOTE — ED Notes (Signed)
Patient requested to leave. Dr. Hulen Skains to bedside. Dr. Eulis Foster discharged patient.

## 2017-06-07 ENCOUNTER — Ambulatory Visit: Payer: Medicaid Other | Admitting: Family Medicine

## 2017-06-07 ENCOUNTER — Other Ambulatory Visit: Payer: Self-pay

## 2017-06-07 ENCOUNTER — Encounter: Payer: Self-pay | Admitting: Family Medicine

## 2017-06-07 VITALS — BP 110/80 | HR 98 | Temp 98.3°F | Ht 67.0 in | Wt 155.0 lb

## 2017-06-07 DIAGNOSIS — F10230 Alcohol dependence with withdrawal, uncomplicated: Secondary | ICD-10-CM | POA: Diagnosis not present

## 2017-06-07 DIAGNOSIS — M545 Low back pain, unspecified: Secondary | ICD-10-CM

## 2017-06-07 HISTORY — DX: Alcohol dependence with withdrawal, uncomplicated: F10.230

## 2017-06-07 MED ORDER — IBUPROFEN 600 MG PO TABS
600.0000 mg | ORAL_TABLET | Freq: Three times a day (TID) | ORAL | 0 refills | Status: DC | PRN
Start: 2017-06-07 — End: 2017-08-08

## 2017-06-07 NOTE — Assessment & Plan Note (Addendum)
Patient has significant drinking history of 2 pints of liquor and has recently cut back to no drinks over the past 4 days.  He is hemodynamically stable and is well-appearing with no clinical signs of alcohol withdrawal at the moment.  Patient feels increased anxiety.  Patient has elevated GAD7 and PHQ 9 scores.  Patient expressed interest in wanting quit alcohol.  Given his high level of alcohol consumption, before he stopped drinking, and his history of multiple ED visit visits for intoxication, I am concerned that he may decompensate quickly due to alcohol withdrawal.  I recommend patient go to [facility] ED for admission to behavioral health for assessment of alcohol withdrawal and detoxification.

## 2017-06-07 NOTE — Progress Notes (Signed)
Subjective:  James [name] is a 42 y.o. male who presents to the [facility] today with a chief complaint of anxiety and left lower back pain.    HPI:  Patient presents with complaint of left lower back pain occurred for 1 month.  He also had a recent ED visit with a told him he had "anxiety".    Alcohol abuse Patient also has significant alcohol substance use disorder history.  With most recent ED visit on 05/31/17, was found to be intoxicated with elevated BAC.  He reports trying to drink since he was 17.  He drinks due to the significant stress in his life.  When asked what this is, he says it is "from everything."  It has gotten worse since she has stopped drinking.  Patient reports over the past year that he drinks roughly 2 points of liquor a day.  Last drink was 4 days ago after his recent ED visit.  He has not been drinking over the past 4 days because he wants to quit drinking altogether.  Patient has quit drinking one time in the past.  He did this "cold [place]" as well this was 2 years ago, when he was only drinking beers.  He started back about a year ago after going to a party and has been drinking heavy liquor ever since. Patient has never tried anything for to help him stop drinking including AA, medications, hospitalizations, other medical care.  He endorses having "shakes" at home.  He denies any recent hallucinations, chest pain, sweats.  Patient is open to seeking hospitalization care and AA for treatment.  PHQ9 21. GAD7 22.   Left lower back pain He says that his left lower back is also been hurting him over the past month.  Endorses multiple falls while intoxicated out of bed.  He is only been taking Tylenol #3 for pain, but this does not help.  He says he does not have the money to buy over-the-counter prescriptions for pain.  The pain is focal on his left side.  It does not radiate anywhere.  It is not getting worse.  He has not had anything like this before.  ROS: Per HPI PMH:  Patient has neurofibromatosis type I that has been stable on radiography.  And has significant smoking history.  Denies any IV drug use, endorses occasional marijuana 1 time last month smoked "took".   Objective:  Physical Exam: BP 110/80   Pulse 98   Temp 98.3 F (36.8 C) (Oral)   Ht 5\' 7"  (1.702 m)   Wt 155 lb (70.3 kg)   SpO2 98%   BMI 24.28 kg/m   Gen: NAD, resting comfortably, not diaphoretic Eyes: Icteric, EOMI,  CV: RRR with no murmurs appreciated Pulm: NWOB, CTAB with no crackles, wheezes, or rhonchi GI: Normal bowel sounds present. Soft, Nontender, Nondistended. MSK: Left lower tenderness over posteriolateral aspect of the rib cage, focal without radiation, no evidence of ecchymosis or trauma. 5/5 strength throughout upper and lower extremities, no edema, cyanosis, or clubbing noted Skin: warm, dry Neuro: No tremors, no asterixis grossly normal, moves all extremities, alert and oriented x3 Psych: Normal affect and thought content  No results found for this or any previous visit (from the past 72 hour(s)).   Assessment/Plan:  Alcohol dependence with uncomplicated withdrawal ([place]) Patient has significant drinking history of 2 pints of liquor and has recently cut back to no drinks over the past 4 days.  He is hemodynamically stable and  is well-appearing with no clinical signs of alcohol withdrawal at the moment.  Patient feels increased anxiety.  Patient has elevated GAD7 and PHQ 9 scores.  Patient expressed interest in wanting quit alcohol.  Given his high level of alcohol consumption, before he stopped drinking, and his history of multiple ED visit visits for intoxication, I am concerned that he may decompensate quickly due to alcohol withdrawal.  I recommend patient go to [facility] ED for admission to behavioral health for assessment of alcohol withdrawal and detoxification.  Back pain Patient is focal left lower back pain on the lateral posterior aspect of his rib.  It is  tender to palpation does not radiate anywhere.  Appears to be musculoskeletal, likely caused by a fall while intoxicated per history.  Has improved with Tylenol No. 3, will prescribe NSAID for pain as needed.   Lab Orders  No laboratory test(s) ordered today    Meds ordered this encounter  Medications  . ibuprofen (ADVIL,MOTRIN) 600 MG tablet    Sig: Take 1 tablet (600 mg total) by mouth every 8 (eight) hours as needed.    Dispense:  30 tablet    Refill:  0    Marny Lowenstein, MD, MS FAMILY MEDICINE RESIDENT - PGY1 06/07/2017 3:19 PM

## 2017-06-07 NOTE — Patient Instructions (Signed)
It was a pleasure to see you today! Thank you for choosing [facility] for your primary care. James [name] was seen for left lower back pain and anxiety.  Your anxiety is likely from your recent quitting of alcohol.  I am concerned that you may withdrawal from alcohol and require hospital services to make sure that this is safe.  Please go to [facility] ED, and tell them that you were sent by her PCP for evaluation for admission to behavioral health for acute alcohol detoxification.   Best,  Marny Lowenstein, MD, MS FAMILY MEDICINE RESIDENT - PGY1 06/07/2017 10:36 AM

## 2017-06-07 NOTE — Assessment & Plan Note (Signed)
Patient is focal left lower back pain on the lateral posterior aspect of his rib.  It is tender to palpation does not radiate anywhere.  Appears to be musculoskeletal, likely caused by a fall while intoxicated per history.  Has improved with Tylenol No. 3, will prescribe NSAID for pain as needed.

## 2017-06-10 ENCOUNTER — Emergency Department (HOSPITAL_COMMUNITY)
Admission: EM | Admit: 2017-06-10 | Discharge: 2017-06-10 | Disposition: A | Discharge status: Home / Self Care | Payer: Medicaid Other | Attending: Emergency Medicine | Admitting: Emergency Medicine

## 2017-06-10 ENCOUNTER — Encounter (HOSPITAL_COMMUNITY): Payer: Self-pay | Admitting: Emergency Medicine

## 2017-06-10 DIAGNOSIS — F10929 Alcohol use, unspecified with intoxication, unspecified: Secondary | ICD-10-CM | POA: Diagnosis not present

## 2017-06-10 DIAGNOSIS — Z5321 Procedure and treatment not carried out due to patient leaving prior to being seen by health care provider: Secondary | ICD-10-CM | POA: Insufficient documentation

## 2017-06-10 NOTE — ED Notes (Signed)
Pt called x3 no response.  

## 2017-06-10 NOTE — ED Triage Notes (Signed)
Pt states he is here for alcohol withdrawal, pt states he is experiencing anxiety from alcohol withdrawal and some "shakes". Pt states he saw PMD on 06/07/2017 and pt advised to come to ED, but patient unable to come to ED until today. Pt states he had a pint of alcohol last night.

## 2017-06-10 NOTE — ED Notes (Addendum)
Pt called x 1 w/o answer.  

## 2017-06-10 NOTE — ED Notes (Signed)
Pt called x 2 w/o response.

## 2017-07-22 ENCOUNTER — Other Ambulatory Visit: Payer: Self-pay | Admitting: *Deleted

## 2017-07-23 ENCOUNTER — Other Ambulatory Visit: Payer: Self-pay

## 2017-07-31 ENCOUNTER — Other Ambulatory Visit: Payer: Self-pay | Admitting: Family Medicine

## 2017-08-07 NOTE — Progress Notes (Signed)
Subjective:   Patient ID: James [name]    DOB: [birth year], 42 y.o. male   MRN: [id number]  James [name] is a 42 y.o. male with a history of NF1, alcohol dependence here for   Alcohol dependence - Last seen 1/11 for alcohol use d/o, went to [facility] ED for detox, but not admitted because he was not actively in withdrawal. - denies tremors, agitation, nausea, sweating, vomiting, hallucinations, seizures - Not previously seen for detox - currently drinks 0.5 gallon of liquor daily. Wants to quit. - PHQ2 negative today.  Cough - Has been coughing for months. Sometimes has sputum production. Previously on Advair with some relief. Not on ACEI. - Saw Dr. Melvyn Novas in 10/2016 with normal PFTs, told to stop smoking. - smokes 0.5ppd since 42yo - previously tried patch but without much success - endorses post-tussive emesis sometimes and reflux sometimes but only after coughing. - endorses SOB with coughing. Denies chest pain. - no recent unexplained weight loss   Side pain - 2-3 months - ibuprofen helped - no recent trauma, endorsed falling in January when intoxicated. - heating pad, positional changes don't help. Tosses and turns in bed at night sometimes due to pain. - moving makes worse - no radiation - no chest pain - sharp, like someone is punching him - sometimes lasts all day, comes and goes. No particular movement or activity makes it worse. - came on gradually   Review of Systems:  Per HPI.  [place]: reviewed. Smoking status reviewed. Medications reviewed.  Objective:   BP 122/86   Pulse 92   Temp 97.8 F (36.6 C) (Oral)   Ht 5\' 7"  (1.702 m)   Wt 154 lb 6.4 oz (70 kg)   SpO2 96%   BMI 24.18 kg/m  Vitals and nursing note reviewed.  General: well nourished, well developed, in no acute distress with non-toxic appearance HEENT: normocephalic, atraumatic, moist mucous membranes CV: regular rate and rhythm without murmurs, rubs, or gallops, no lower extremity edema Lungs:  clear to auscultation bilaterally with normal work of breathing. No wheezes, rhonchi, or rales. Skin: warm, dry. 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 Extremities: warm and well perfused, normal tone MSK: ROM grossly intact, strength intact, gait normal. Pain with light palpation over L side mid-thoracic ribs, and thoracic spine. No abnormalities palpated. Full ROM with leaning from side to side and touching toes, although with some pain with leaning to the R side. Neuro: Alert and oriented, speech normal  Assessment & Plan:   Emphysema lung ([place]) Although PFTs from 10/2016 wnl, emphysematous changes and chronic bronchitis noted on CXR from 03/2017. Previously found relief from Advair. Without hospitalizations or known exacerbations, would fall into Category B of GOLD criteria, will start on Spiriva. Advised to quit smoking and make appointment with Dr. Valentina Lucks.  Back pain Back pain unchanged in location or intensity since last visit. Endorses relief with ibuprofen. Refill sent. Explained to patient it is unclear the etiology although it seems to be MSK related after sustaining fall mentioned in last visit. Unclear whether his NF seems to be playing a role. Last MRI 02/2017 with benign findings, no need for repeat imaging at this time. ROM on exam wnl. Will refer to NF clinic.  Alcohol dependence with uncomplicated withdrawal ([place]) Went to [facility] ED after last visit for detox but not admitted due to not being in active withdrawal. Seems motivated to quit. Gave resources for inpatient and outpatient facilities. Will follow up at next visit  to assess where he is in the process and assess any barriers to change.  Orders Placed This Encounter  Procedures  . Ambulatory referral to Neurology    Referral Priority:   Routine    Referral Type:   Consultation    Referral Reason:   Specialty Services Required    Requested Specialty:   Neurology    Number of Visits Requested:   1   Meds  ordered this encounter  Medications  . tiotropium (SPIRIVA HANDIHALER) 18 MCG inhalation capsule    Sig: Place 1 capsule (18 mcg total) into inhaler and inhale daily.    Dispense:  30 capsule    Refill:  12  . ibuprofen (ADVIL,MOTRIN) 600 MG tablet    Sig: Take 1 tablet (600 mg total) by mouth every 8 (eight) hours as needed.    Dispense:  30 tablet    Refill:  0    Rory Percy, DO PGY-1, [facility] 08/08/2017 12:39 PM

## 2017-08-08 ENCOUNTER — Other Ambulatory Visit: Payer: Self-pay

## 2017-08-08 ENCOUNTER — Encounter: Payer: Self-pay | Admitting: Family Medicine

## 2017-08-08 ENCOUNTER — Ambulatory Visit: Payer: Medicaid Other | Admitting: Family Medicine

## 2017-08-08 VITALS — BP 122/86 | HR 92 | Temp 97.8°F | Ht 67.0 in | Wt 154.4 lb

## 2017-08-08 DIAGNOSIS — M546 Pain in thoracic spine: Secondary | ICD-10-CM | POA: Diagnosis not present

## 2017-08-08 DIAGNOSIS — Q8501 Neurofibromatosis, type 1: Secondary | ICD-10-CM

## 2017-08-08 DIAGNOSIS — F10230 Alcohol dependence with withdrawal, uncomplicated: Secondary | ICD-10-CM

## 2017-08-08 DIAGNOSIS — J439 Emphysema, unspecified: Secondary | ICD-10-CM

## 2017-08-08 MED ORDER — TIOTROPIUM BROMIDE MONOHYDRATE 18 MCG IN CAPS: 18.0000 ug | ORAL_CAPSULE | Freq: Every day | RESPIRATORY_TRACT | 12 refills | Status: DC

## 2017-08-08 MED ORDER — IBUPROFEN 600 MG PO TABS: 600.0000 mg | ORAL_TABLET | Freq: Three times a day (TID) | ORAL | 0 refills | Status: DC | PRN

## 2017-08-08 NOTE — Patient Instructions (Signed)
It was great to see you!  For your cough,  - I am prescribing a different inhaler, Spiriva. I would like to see you back in 6 weeks to see how it is working for you.  For your alcohol use - There are many resources in the [place] area (thankfully!) that can help you to stop drinking. Take a look at the handout I provided to choose a resource.  For your smoking, - Stopping smoking is one of the best things you can do for your health. Please make an appointment with Dr. Valentina Lucks when you check out.  For your side pain, - It is hard to know exactly what is causing your pain. It is unlikely that an additional MRI or imaging will be revealing.  - Because you had good relief from the ibuprofen, I recommend continuing this as needed. I sent in a refill.  Take care and seek immediate care sooner if you develop any concerns.   Rory Percy, DO [facility]

## 2017-08-08 NOTE — Assessment & Plan Note (Signed)
Went to [facility] ED after last visit for detox but not admitted due to not being in active withdrawal. Seems motivated to quit. Gave resources for inpatient and outpatient facilities. Will follow up at next visit to assess where he is in the process and assess any barriers to change.

## 2017-08-08 NOTE — Assessment & Plan Note (Addendum)
Back pain unchanged in location or intensity since last visit. Endorses relief with ibuprofen. Refill sent. Explained to patient it is unclear the etiology although it seems to be MSK related after sustaining fall mentioned in last visit. Unclear whether his NF seems to be playing a role. Last MRI 02/2017 with benign findings, no need for repeat imaging at this time. ROM on exam wnl. Will refer to NF clinic.

## 2017-08-08 NOTE — Assessment & Plan Note (Addendum)
Although PFTs from 10/2016 wnl, emphysematous changes and chronic bronchitis noted on CXR from 03/2017. Previously found relief from Advair. Without hospitalizations or known exacerbations, would fall into Category B of GOLD criteria, will start on Spiriva. Advised to quit smoking and make appointment with Dr. Valentina Lucks.

## 2017-08-12 ENCOUNTER — Ambulatory Visit: Payer: Self-pay | Admitting: Pharmacist

## 2017-08-15 ENCOUNTER — Ambulatory Visit: Payer: Medicaid Other | Admitting: Pharmacist

## 2017-08-15 ENCOUNTER — Encounter: Payer: Self-pay | Admitting: Pharmacist

## 2017-08-15 DIAGNOSIS — F17210 Nicotine dependence, cigarettes, uncomplicated: Secondary | ICD-10-CM

## 2017-08-15 MED ORDER — VARENICLINE TARTRATE 0.5 MG X 11 & 1 MG X 42 PO MISC: ORAL | 0 refills | Status: DC

## 2017-08-15 NOTE — Progress Notes (Signed)
   S:  Patient arrives in good spirits, self ambulating. Patient presents with wife, Burel Kahre, and young son.  Patient arrives for evaluation/assistance with tobacco dependence.  Patient was referred on 08/08/2017.  Patient was last seen by Primary Care Provider on the same date. Patient reports he is very determined to quit smoking, "just has to quit so he can live." He endorses being afraid of lung cancer. Patient reports that when he smokes he likes the stress reduction and relaxation that it provides for him. Patient denies using other forms of tobacco other than cigarettes. Patient reports he splits a pack of cigarettes a day with his wife, but then smokes more in the evenings (on a daily basis) when he is drinking liquor. Patient reports that he is also trying to reduce his alcohol intake and is contemplating attending [place] meetings in the near future (information provided to him at a previous visit with PCP). Patient does report that he is more confident in his ability to quit smoking over his ability to completely stop drinking alcohol, but he has been successful in cutting back use to only 1 pint per day. Patient reports his most recent quit attempt he used only the patches and became frustrated with the adhesive so he quit using the patches. He has had 2 successful quit attempts in the past for 3 years and then again for 1 year.   Age when started using tobacco on a daily basis 15 years ago. Number of Cigarettes per day 10, but does smoke more in the evening when he drinks. Denies waking to smoke. Only smokes outside of the home.  Most recent quit attempt a few months ago tried patches for 3 months, patches not sticking well and quit for about 2 weeks with the patches before became too frustrated with the adhesive. Longest time ever been tobacco free 3 years, but immediately started smoking again when able.   Medications used in prior in past cessation efforts include: NRT and cold [place]  only.  Rates IMPORTANCE of quitting tobacco on 1-10 scale of 10 Rates CONFIDENCE of quitting tobacco on 1-10 scale of 10, patient reports confidence to stop drinking is 5/10.  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 Initiated varenicline tx with 1 month starter pack dose Patient counseled on purpose, proper use, and potential adverse effects, including GI upset, and potential change in mood. Patient instructed to take medication with meals and set a quit date for about 10 days from today.   Written information provided. Provided information on 1 800-QUIT NOW support program.  F/U Rx Clinic Visit in 3-4 weeks to assess progress and provide refills if patient would like to continue with use of Chantix. Total time in face-to-face counseling 45 minutes.  Patient seen with Hildred Alamin, PharmD Candidate and Jalene Mullet, PharmD, PGY1 Resident.

## 2017-08-15 NOTE — Assessment & Plan Note (Signed)
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 Initiated varenicline tx with 1 month starter pack dose Patient counseled on purpose, proper use, and potential adverse effects, including GI upset, and potential change in mood. Patient instructed to take medication with meals and set a quit date for about 10 days from today.

## 2017-08-15 NOTE — Patient Instructions (Addendum)
Thank you for coming to see me today. It was great to see you and I am glad that you are trying to stop smoking. This is the BEST thing you can do for your health.   1. I have sent in a prescription for Chantix to your pharmacy. This will be a 1 month starter pack and the medication will be in a punch out card with directions. Take the medication each day with food and follow the directions on the punch out card for the daily doses. 2. When you get to the blue tablets and are tolerating that dose for a few days you can set your quit date.  3. On your quit date you will stop smoking, if you do smoke you will probably notice that you don't get the same effect. 4. It will be important for you to find a way to avoid your triggers for smoking.  5. Continue to cut back on your alcohol intake and try the AA meetings that Dr. Ky Barban suggested at your last visit.   I would like to see you back in 3-4 weeks to see how you are doing and we can continue the medication.   Good luck! You can do this!

## 2017-08-16 NOTE — Progress Notes (Signed)
Patient ID: James [name], male   DOB: April 09, 1976, 42 y.o.   MRN: [id number] Reviewed: Agree with Dr. Graylin Shiver documentation and management.

## 2017-09-12 ENCOUNTER — Ambulatory Visit: Payer: Self-pay | Admitting: Pharmacist

## 2017-09-14 ENCOUNTER — Other Ambulatory Visit: Payer: Self-pay | Admitting: Internal Medicine

## 2017-09-16 ENCOUNTER — Other Ambulatory Visit: Payer: Self-pay | Admitting: *Deleted

## 2017-09-16 ENCOUNTER — Ambulatory Visit: Payer: Medicaid Other | Admitting: Pharmacist

## 2017-09-17 ENCOUNTER — Other Ambulatory Visit: Payer: Self-pay

## 2017-09-18 ENCOUNTER — Other Ambulatory Visit: Payer: Self-pay | Admitting: Family Medicine

## 2017-09-18 MED ORDER — TIOTROPIUM BROMIDE MONOHYDRATE 18 MCG IN CAPS: 18.0000 ug | ORAL_CAPSULE | Freq: Every day | RESPIRATORY_TRACT | 12 refills | Status: DC

## 2017-09-23 ENCOUNTER — Ambulatory Visit: Payer: Medicaid Other | Admitting: Pharmacist

## 2017-09-23 ENCOUNTER — Encounter: Payer: Self-pay | Admitting: Pharmacist

## 2017-09-23 DIAGNOSIS — R0602 Shortness of breath: Secondary | ICD-10-CM | POA: Diagnosis not present

## 2017-09-23 DIAGNOSIS — F17210 Nicotine dependence, cigarettes, uncomplicated: Secondary | ICD-10-CM

## 2017-09-23 MED ORDER — VARENICLINE TARTRATE 0.5 MG PO TABS: 0.5000 mg | ORAL_TABLET | Freq: Two times a day (BID) | ORAL | 0 refills | Status: DC

## 2017-09-23 NOTE — Assessment & Plan Note (Signed)
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 Pt. continues to make progress in advancing tobacco cessation plan. Set quit date of May 2019.   Changed dose from varenicline 1mg  daily to 0.5mg  once daily. If tolerated, plan to increase dose in the near future to 0.5mg  BID. Lower dose chosen due to significant ADEs associated w/ higher 1mg  BID and once daily dosing. Counseled to continue taking varenicline for at least 2 weeks after quit date.

## 2017-09-23 NOTE — Progress Notes (Signed)
   S:  Patient arrives ambulating independently and accompanied by wife and child.   Patient arrives for evaluation/assistance with tobacco dependence.  Patient was referred on 08/08/17 by Dr. Ky Barban.  Patient was last seen by Rx Clinic on 08/15/17.   Since starting varenicline, pt has experienced decreased smoking urges. Pt has decreased cigarette use from 10 cigarettes/day in March to 1 cigarette/day now. Started varenicline (Chantix) starter pack on 08/15/17. Denies GI side effects since initiation of therapy, however pt reports varenicline 1mg  BID dose caused night sweats, insomnia, and nightmares. Decreased dose from 1mg  BID to 1mg  once daily prior to office visit. However, pt still reports nighttime sx w/ current 1mg  once daily dosing.  Additionally, since starting Spiriva handihaler pt reports improved breathing. Denies needing to use albuterol in the last month.   Rates CONFIDENCE of quitting tobacco on 1-10 scale of 8.  Motivation to quit: Cost savings and improved health.   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 Pt. continues to make progress in advancing tobacco cessation plan. Set quit date of May 2019.   Changed dose from varenicline 1mg  daily to 0.5mg  once daily. If tolerated, plan to increase dose in the near future to 0.5mg  BID. Lower dose chosen due to significant ADEs associated w/ higher 1mg  BID and once daily dosing. Counseled to continue taking varenicline for at least 2 weeks after quit date.   Shortness of breath improved with use of tiotropium and tobacco intake reduction to 1 or less cigs per day.  Encouraged to continue using tiotropium at this time.   Written information provided. F/U w/ Dr.  Ky Barban or Rx Clinic visit in 3 weeks. Total time in face-to-face counseling 30 minutes.  Patient seen with Hildred Alamin, PharmD Candidate and Bertis Ruddy, PharmD, PGY1 Resident. Marland Kitchen

## 2017-09-23 NOTE — Patient Instructions (Addendum)
Thank you for coming into the clinic today! Great job decreasing your daily cigarette use. We discussed your quit date of May 2019. Keep up your plan to quit smoking.  Stay on Chantix at least 2 weeks after quitting. Follow up with Dr. Ky Barban or Dr. Valentina Lucks in 3 weeks.

## 2017-09-23 NOTE — Assessment & Plan Note (Signed)
Shortness of breath improved with use of tiotropium and tobacco intake reduction to 1 or less cigs per day.  Encouraged to continue using tiotropium at this time.

## 2017-09-24 ENCOUNTER — Ambulatory Visit: Payer: Self-pay | Admitting: Family Medicine

## 2017-10-02 NOTE — Addendum Note (Signed)
Addended by: Dorna Bloom on: 10/02/2017 04:02 PM   Modules accepted: Orders

## 2017-10-03 MED ORDER — HYDROCHLOROTHIAZIDE 12.5 MG PO CAPS: 12.5000 mg | ORAL_CAPSULE | Freq: Every day | ORAL | 3 refills | Status: DC

## 2017-10-14 NOTE — Progress Notes (Signed)
Subjective:   Patient ID: James [name]    DOB: 03/08/1976, 42 y.o. male   MRN: [id number]  James [name] is a 42 y.o. male with a history of NF1, HTN, emphysema here for   Back/Side Pain - here today for follow up. On 3/14 gave ibuprofen 600mg , NF referral sent, MRI 02/2017 with benign findings. - states [facility] called while his wife was in the hospital, is planning to call back - states ibuprofen is helping significantly with his pain and is able to move more. Requesting refill.  Emphysema - Taking Spiriva every morning with good toleration.  - Denies episodes of wheezing, difficulty breathing. - does not currently have an albuterol inhaler.  Tobacco Use - set quit date May 2019 and previously down to 4 cigarettes per day as of last week. Recently had increase to 0.5ppd last week due to stressors of wife being in the hospital and taking care of his kids. Is motivated to cut down again and quit for good. States his biggest motivators are his kids and being healthy for them. - have seen Dr. Valentina Lucks and started varenicline, currently taking 1.5mg  daily (1 morning, 0.5mg  night). Was on 1mg  BID but was not tolerating it well (having bad dreams, dry mouth) - does not need refill at this time, states he will be making an appointment to follow up with Dr. Valentina Lucks  Review of Systems:  Per HPI.   [place]: reviewed. Smoking status reviewed. Medications reviewed.  Objective:   BP 110/70   Pulse (!) 108   Temp 98.5 F (36.9 C) (Oral)   Ht 5\' 7"  (1.702 m)   Wt 155 lb (70.3 kg)   SpO2 95%   BMI 24.28 kg/m  Vitals and nursing note reviewed.  General: well nourished, well developed, in no acute distress with non-toxic appearance CV: regular rate and rhythm without murmurs, rubs, or gallops Lungs: clear to auscultation bilaterally with normal work of breathing. No wheezes, rales, rhonchi. Saturations appropriate on RA. MSK: hypertonic paravertebral musculature noted to R thoracic back.  Antalgic stance with slight slump and leaning to the left when standing however gait normal Neuro: Alert and oriented, speech normal  Assessment & Plan:   Backache Back pain continues to be manageable with PRN ibuprofen, refill sent. Continues to have hypertonic musculature pointing to probable MSK origin. Patient to call NF clinic to schedule appointment, will f/u recommendations.  Emphysema lung ([place]) Exam wnl today. Tolerating Spiriva well, refill sent. Also provided albuterol for rescue inhaler and provided return precautions with emergency care parameters.  Cigarette smoker Patient actively making progress in cutting down on smoking with aid of Chantix although with some relapse noted with current stress. Will f/u with [place] or myself for further counseling and medication management.  No orders of the defined types were placed in this encounter.  Meds ordered this encounter  Medications  . ibuprofen (ADVIL,MOTRIN) 600 MG tablet    Sig: Take 1 tablet (600 mg total) by mouth every 8 (eight) hours as needed.    Dispense:  30 tablet    Refill:  0  . tiotropium (SPIRIVA HANDIHALER) 18 MCG inhalation capsule    Sig: Place 1 capsule (18 mcg total) into inhaler and inhale daily.    Dispense:  30 capsule    Refill:  12  . albuterol (PROVENTIL HFA;VENTOLIN HFA) 108 (90 Base) MCG/ACT inhaler    Sig: Inhale 2 puffs into the lungs every 6 (six) hours as needed for wheezing  or shortness of breath.    Dispense:  8.5 Inhaler    Refill:  [place], DO PGY-1, [facility] 10/16/2017 7:52 PM

## 2017-10-16 ENCOUNTER — Ambulatory Visit: Payer: Medicaid Other | Admitting: Family Medicine

## 2017-10-16 ENCOUNTER — Encounter: Payer: Self-pay | Admitting: Family Medicine

## 2017-10-16 ENCOUNTER — Other Ambulatory Visit: Payer: Self-pay

## 2017-10-16 VITALS — BP 110/70 | HR 108 | Temp 98.5°F | Ht 67.0 in | Wt 155.0 lb

## 2017-10-16 DIAGNOSIS — F17210 Nicotine dependence, cigarettes, uncomplicated: Secondary | ICD-10-CM | POA: Diagnosis not present

## 2017-10-16 DIAGNOSIS — M546 Pain in thoracic spine: Secondary | ICD-10-CM | POA: Diagnosis present

## 2017-10-16 DIAGNOSIS — J439 Emphysema, unspecified: Secondary | ICD-10-CM

## 2017-10-16 MED ORDER — TIOTROPIUM BROMIDE MONOHYDRATE 18 MCG IN CAPS: 18.0000 ug | ORAL_CAPSULE | Freq: Every day | RESPIRATORY_TRACT | 12 refills | Status: DC

## 2017-10-16 MED ORDER — ALBUTEROL SULFATE HFA 108 (90 BASE) MCG/ACT IN AERS: 2.0000 | INHALATION_SPRAY | Freq: Four times a day (QID) | RESPIRATORY_TRACT | 1 refills | Status: DC | PRN

## 2017-10-16 MED ORDER — IBUPROFEN 600 MG PO TABS: 600.0000 mg | ORAL_TABLET | Freq: Three times a day (TID) | ORAL | 0 refills | Status: DC | PRN

## 2017-10-16 NOTE — Assessment & Plan Note (Addendum)
Exam wnl today. Tolerating Spiriva well, refill sent. Also provided albuterol for rescue inhaler and provided return precautions with emergency care parameters.

## 2017-10-16 NOTE — Assessment & Plan Note (Signed)
Back pain continues to be manageable with PRN ibuprofen, refill sent. Continues to have hypertonic musculature pointing to probable MSK origin. Patient to call NF clinic to schedule appointment, will f/u recommendations.

## 2017-10-16 NOTE — Patient Instructions (Signed)
It was great to see you!  For your emphysema,  - I refilled your Spiriva. - I am so proud of your decision to stop smoking! It is the best thing you can do for your health. Continue to take the chantix as you have been doing and make an appointment with Dr. Valentina Lucks on your way out today.  For your back pain, - I refilled your ibuprofen. - The number for [facility] Neurology is [phone]. You may call to check on the status of your referral appointment. - I am re-placing the neurosurgery referral  Take care and seek immediate care sooner if you develop any concerns.   Dr. [facility]

## 2017-10-16 NOTE — Assessment & Plan Note (Signed)
Patient actively making progress in cutting down on smoking with aid of Chantix although with some relapse noted with current stress. Will f/u with Koval or myself for further counseling and medication management.

## 2017-11-09 ENCOUNTER — Other Ambulatory Visit: Payer: Self-pay | Admitting: Family Medicine

## 2017-11-09 DIAGNOSIS — F17210 Nicotine dependence, cigarettes, uncomplicated: Secondary | ICD-10-CM

## 2017-11-23 ENCOUNTER — Other Ambulatory Visit: Payer: Self-pay | Admitting: Family Medicine

## 2017-11-23 DIAGNOSIS — F17210 Nicotine dependence, cigarettes, uncomplicated: Secondary | ICD-10-CM

## 2017-12-09 IMAGING — DX DG CHEST 2V
2 series · 2 of 2 positions shown · non-contrast
Comparison: Chest 02/09/2016.  CT chest 02/09/2016.

CLINICAL DATA: Patient is not feeling well after 3 shots of
[place]. Chronic chest pain.

EXAM:
CHEST  2 VIEW

[w chest pa]
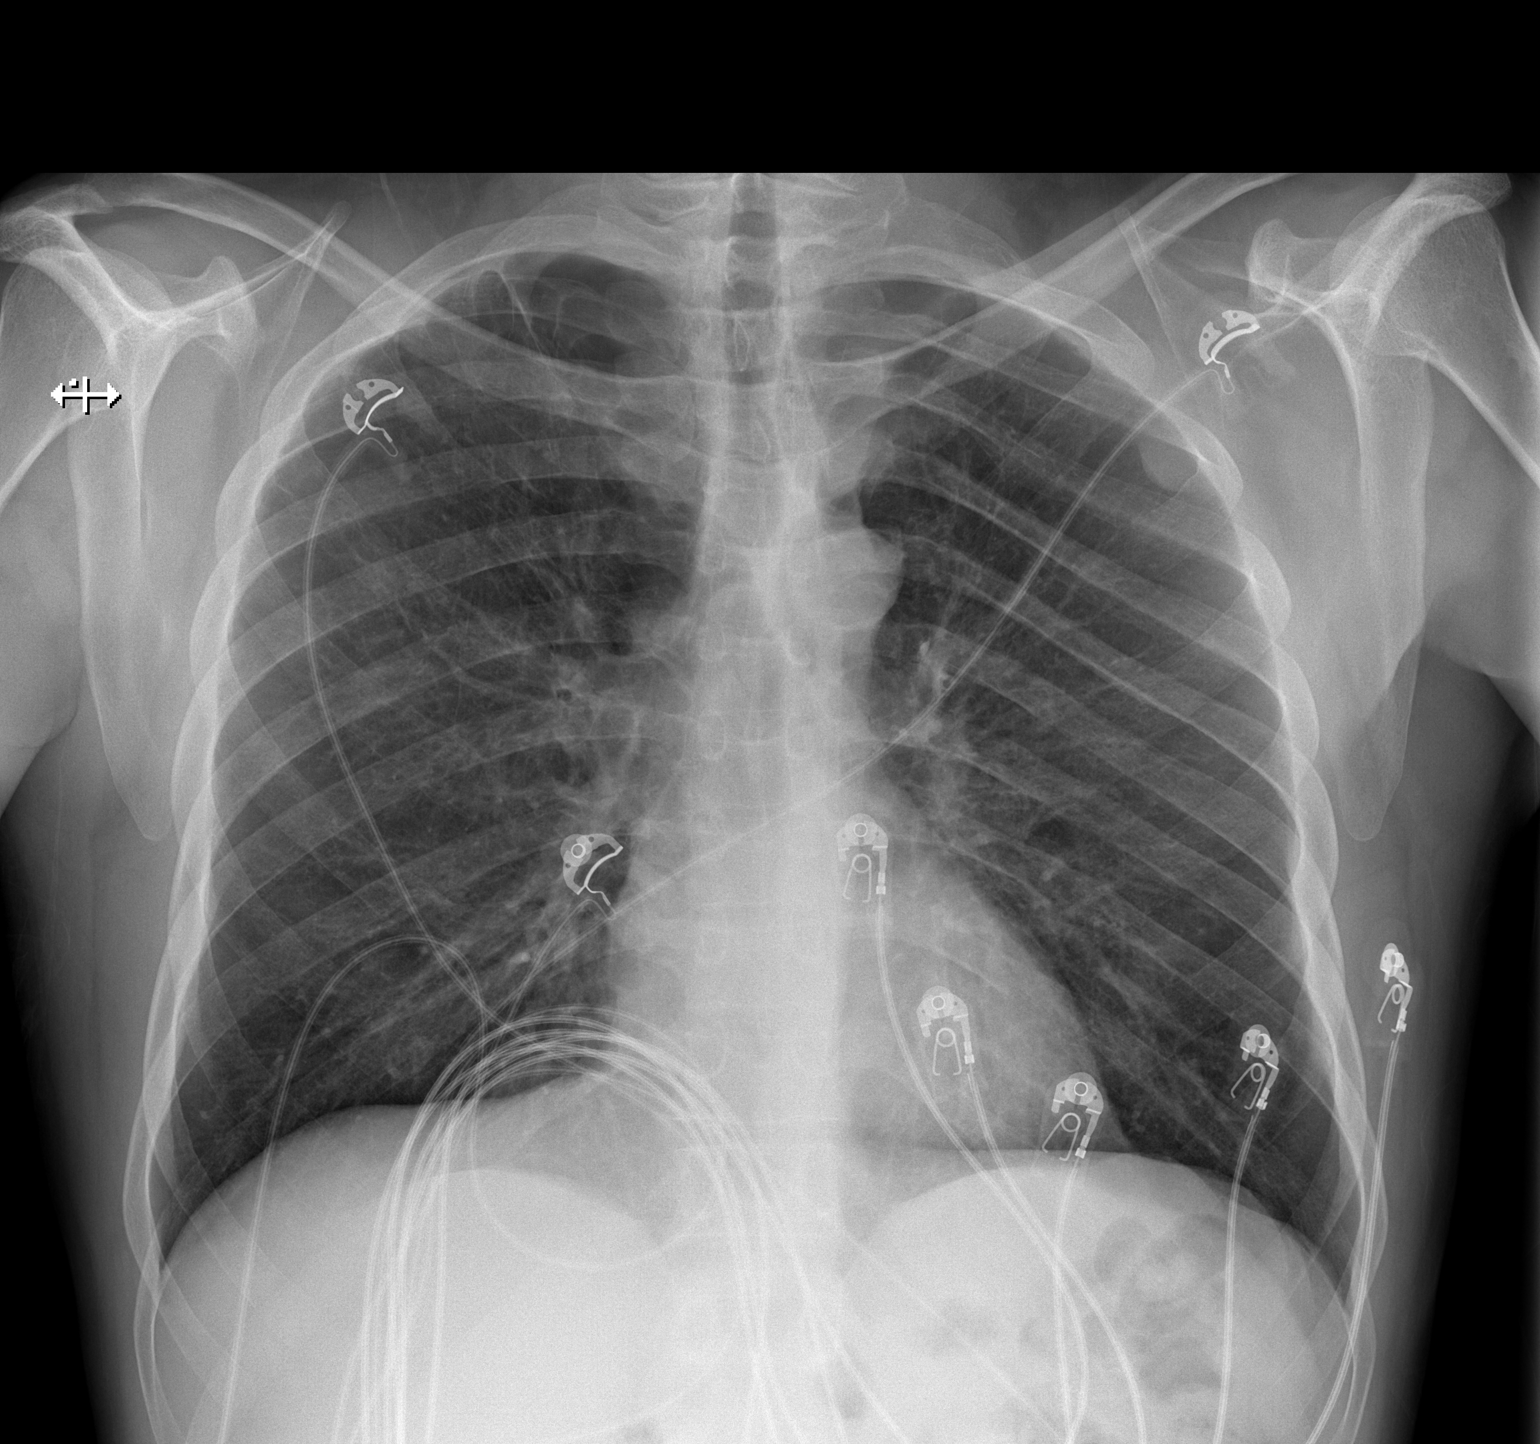

[w chest lat]
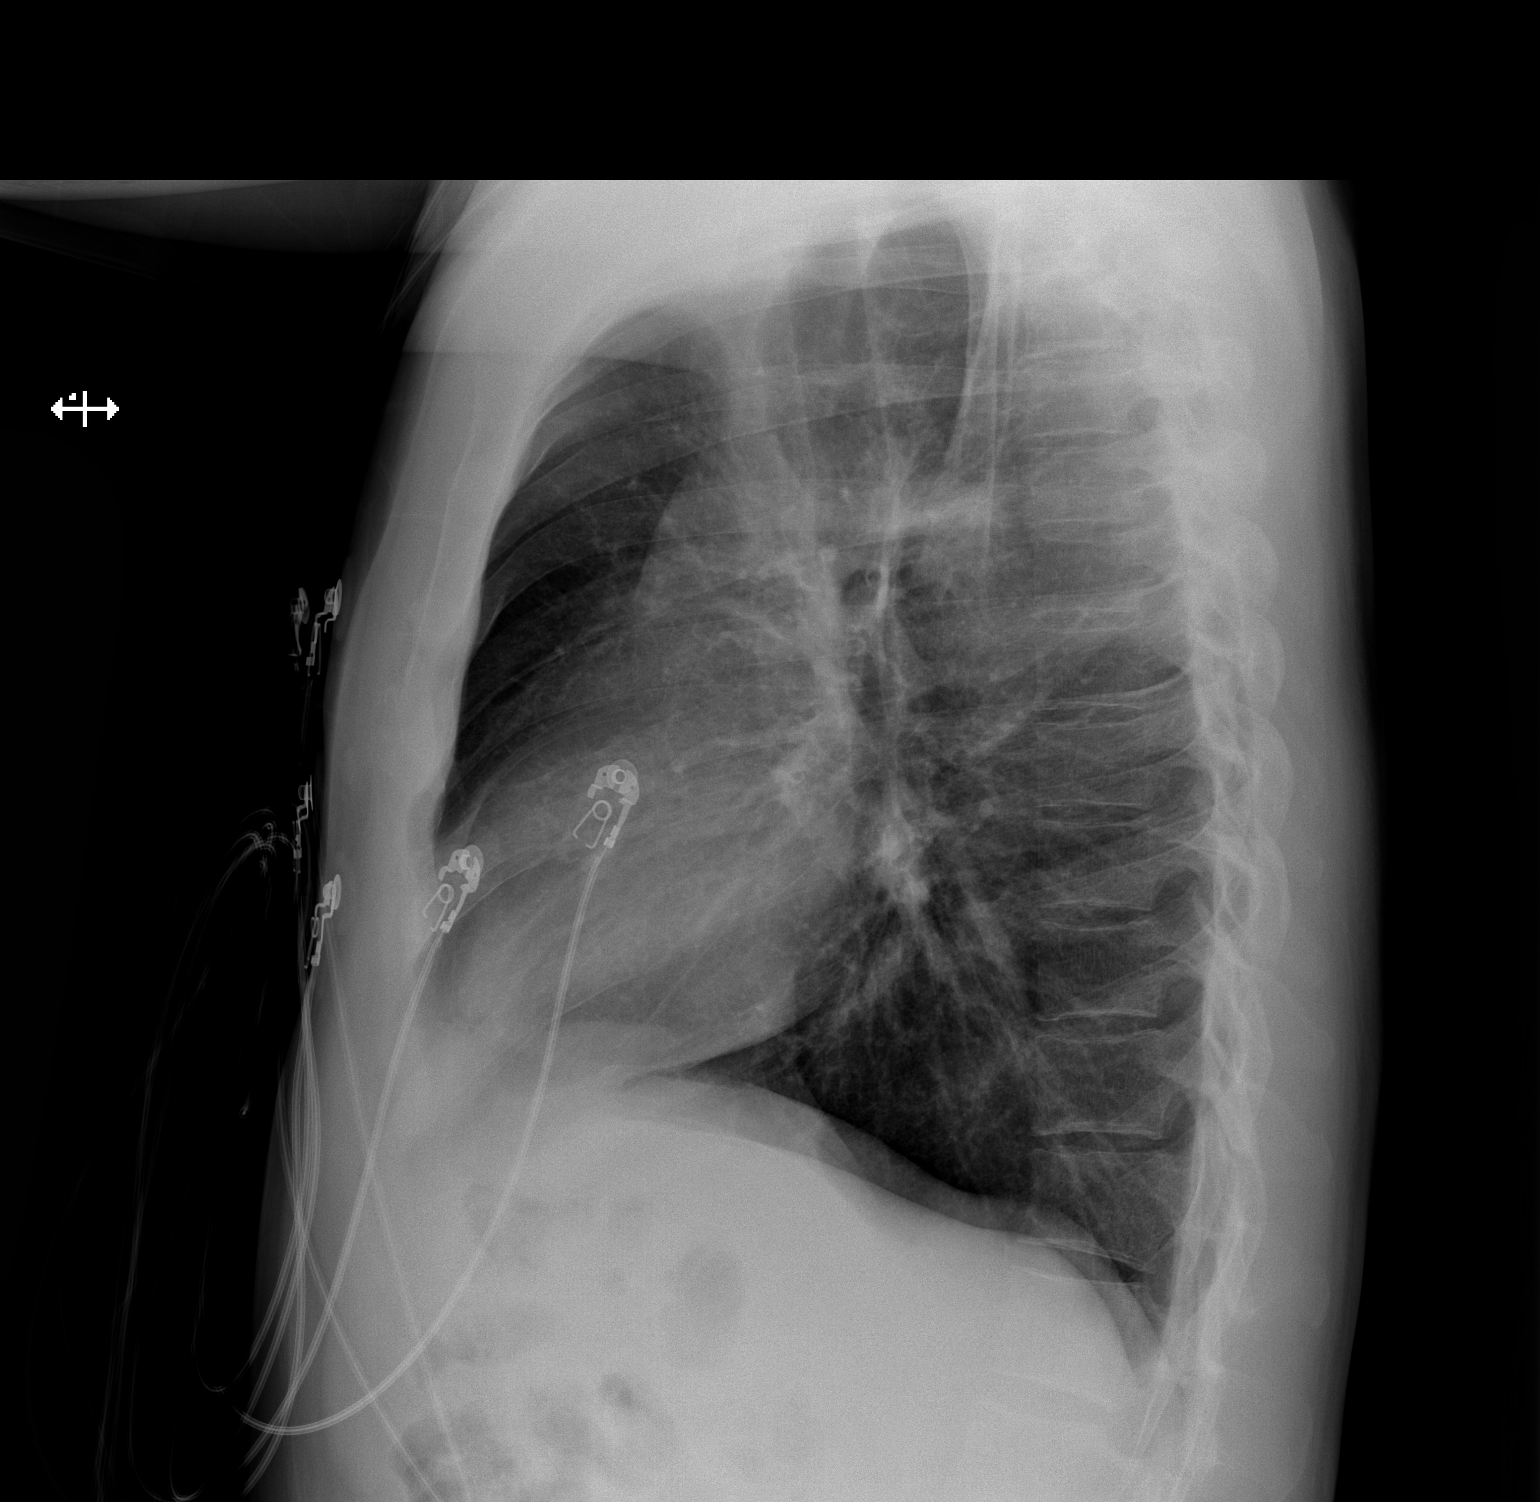

[2 of 2 positions shown; findings below may reference images not displayed]

FINDINGS: Extra pulmonary nodule in the left upper chest laterally measuring
19 mm diameter. No change since the previous studies. Heart size and
pulmonary vascularity are normal. Emphysematous changes and scarring
in the lung apices. Peribronchial thickening with central
interstitial changes likely chronic bronchitis. No consolidation or
airspace disease. No blunting of costophrenic angles. No
pneumothorax.
IMPRESSION: Emphysematous changes and chronic bronchitic changes in the lungs
with scarring in the lung apices. Unchanged appearance of the extra
pulmonary nodule in the left upper chest. No evidence of active
consolidation.

## 2018-01-22 ENCOUNTER — Other Ambulatory Visit: Payer: Self-pay

## 2018-01-22 ENCOUNTER — Ambulatory Visit: Payer: Medicaid Other | Admitting: Family Medicine

## 2018-01-22 ENCOUNTER — Ambulatory Visit
Admission: RE | Admit: 2018-01-22 | Discharge: 2018-01-22 | Disposition: A | Discharge status: Home / Self Care | Payer: Medicaid Other | Source: Ambulatory Visit | Attending: Family Medicine | Admitting: Family Medicine

## 2018-01-22 VITALS — BP 132/74 | HR 97 | Temp 97.8°F | Wt 152.0 lb

## 2018-01-22 DIAGNOSIS — R059 Cough, unspecified: Secondary | ICD-10-CM

## 2018-01-22 DIAGNOSIS — R05 Cough: Secondary | ICD-10-CM | POA: Diagnosis not present

## 2018-01-22 DIAGNOSIS — R1013 Epigastric pain: Secondary | ICD-10-CM

## 2018-01-22 MED ORDER — ONDANSETRON HCL 4 MG PO TABS: 4.0000 mg | ORAL_TABLET | Freq: Three times a day (TID) | ORAL | 0 refills | Status: DC | PRN

## 2018-01-22 NOTE — Progress Notes (Signed)
  CXR without pneumonia. Demonstrates increased lung volumes, flattened diaphragm consistent w/ COPD. Called patient to inform him, he is appreciative of call. Will call him again once bloodwork is back.

## 2018-01-22 NOTE — Progress Notes (Signed)
    Subjective:    Patient ID: James [name], male    DOB: [birth year], 42 y.o.   MRN: [id number]   CC: pain after eating  HPI: patient reporting increased coughing for past 2 days as well as vomiting after coughing. He states he's unable to keep food down for past 2 days. "as soon as I put food in my mouth it comes back up". He is most worried about the worsening cough. He denies URI symptoms like congestion, sore throat, headache, runny nose. Endorses feeling sick on his stomach. Endorses abdominal pain around his belly button. Denies burning or pain with urination. Denies constipation or diarrhea. No sick contacts. Denies fevers or chills.   He drinks 1 pint of vodka a day. Last drank yesterday. He smokes 1/2 a pack of cigarettes a day. Reports he was told in past he has COPD but then was told he did not. He has albuterol that he has tried without relief of cough and spiriva that he "uses sometimes when I remember".   Smoking status reviewed- current smoker  Review of Systems- see HPI, additionally, no blood or biliary material in vomit   Objective:  BP 132/74   Pulse 97   Temp 97.8 F (36.6 C) (Oral)   Wt 152 lb (68.9 kg)   SpO2 94%   BMI 23.81 kg/m  Vitals and nursing note reviewed  General: well appearing, well nourished, in no acute distress HEENT: normocephalic, moist mucous membranes without discharge noted in posterior oropharynx however erythema present Neck: supple, non-tender, without lymphadenopathy Cardiac: RRR, clear S1 and S2, no murmurs, rubs, or gallops Respiratory: clear to auscultation bilaterally, no increased work of breathing Abdomen: Tender to palpation of epigastric area, RUQ and RLQ. No guarding or rebound tenderness. Abdomen is soft, nondistended, no masses or organomegaly appreciated on exam. Bowel sounds present Neuro: alert and oriented, no focal deficits   Assessment & Plan:    Cough  Normal work of breathing, PO2 94% on RA, lungs clear to  auscultation. Will get CXR to r/o pneumonia and go from there.   Epigastric pain  Unclear etiology however patient is an alcoholic making pancreatitis or liver etiology possible. Will check CMP and lipase. Will hold off on imaging at this time. Will check CBC to assess for leukocytosis. Advised patient to go to ED if he worsens.     Return if symptoms worsen or fail to improve.   Lucila Maine, DO Family Medicine Resident PGY-3

## 2018-01-22 NOTE — Assessment & Plan Note (Signed)
  Normal work of breathing, PO2 94% on RA, lungs clear to auscultation. Will get CXR to r/o pneumonia and go from there.

## 2018-01-22 NOTE — Patient Instructions (Signed)
  Please get your chest x-ray done and we'll call you with the results of imaging and lab tests.  Please return to be seen if your symptoms worsen or if you develop fevers, chills, inability to keep yourself hydrated.   If you have questions or concerns please do not hesitate to call at [phone].  Lucila Maine, DO PGY-3, [facility] 01/22/2018 9:55 AM

## 2018-01-22 NOTE — Assessment & Plan Note (Addendum)
  Unclear etiology however patient is an alcoholic making pancreatitis or liver etiology possible. Vitals stable and patient is afebrile. He is well hydrated on exam. Will check CMP and lipase. Will hold off on imaging at this time. Will check CBC to assess for leukocytosis. Advised patient to go to ED if he worsens. Rx for zofran given for nausea.

## 2018-01-23 LAB — CMP14+EGFR
A/G RATIO: 1.5 (ref 1.2–2.2)
ALT: 45 IU/L — AB (ref 0–44)
AST: 93 IU/L — AB (ref 0–40)
Albumin: 4.6 g/dL (ref 3.5–5.5)
Alkaline Phosphatase: 164 IU/L — ABNORMAL HIGH (ref 39–117)
BUN/Creatinine Ratio: 6 — ABNORMAL LOW (ref 9–20)
BUN: 5 mg/dL — ABNORMAL LOW (ref 6–24)
Bilirubin Total: 0.5 mg/dL (ref 0.0–1.2)
CO2: 26 mmol/L (ref 20–29)
Calcium: 10 mg/dL (ref 8.7–10.2)
Chloride: 93 mmol/L — ABNORMAL LOW (ref 96–106)
Creatinine, Ser: 0.89 mg/dL (ref 0.76–1.27)
GFR calc Af Amer: 124 mL/min/{1.73_m2} (ref >59)
GFR calc non Af Amer: 107 mL/min/{1.73_m2} (ref >59)
Globulin, Total: 3 g/dL (ref 1.5–4.5)
Glucose: 70 mg/dL (ref 65–99)
POTASSIUM: 4 mmol/L (ref 3.5–5.2)
Sodium: 139 mmol/L (ref 134–144)
Total Protein: 7.6 g/dL (ref 6.0–8.5)

## 2018-01-23 LAB — CBC
HEMOGLOBIN: 19.7 g/dL — AB (ref 13.0–17.7)
Hematocrit: 54.9 % — ABNORMAL HIGH (ref 37.5–51.0)
MCH: 34.8 pg — AB (ref 26.6–33.0)
MCHC: 35.9 g/dL — ABNORMAL HIGH (ref 31.5–35.7)
MCV: 97 fL (ref 79–97)
PLATELETS: 247 10*3/uL (ref 150–450)
RBC: 5.66 x10E6/uL (ref 4.14–5.80)
RDW: 13.6 % (ref 12.3–15.4)
WBC: 5 10*3/uL (ref 3.4–10.8)

## 2018-01-23 LAB — LIPASE: Lipase: 17 U/L (ref 13–78)

## 2018-01-24 ENCOUNTER — Telehealth: Payer: Self-pay | Admitting: Family Medicine

## 2018-01-24 NOTE — Telephone Encounter (Signed)
  Called James [name] to discuss lab results. Lipase negative. Minor elevation in LFTs likely related to alcohol abuse. No leukocytosis. Elevated hemoglobin likely related to smoking.   He reports he's throwing up some but is able to keep things down with the use of zofran. He's able to stay hydrated. He is avoiding alcohol and drinking mostly water.   He's using his inhalers. He has not tried anything for cough. No congestion, sore throat, URI symptoms. No fevers or chills. Advised using cough syrup or cough drops. Discussed going to UC or ED over the weekend if he worsens but for now will wait and see how he does over the next several days. Asked him to return to clinic if he is not improved by next week. The patient indicates understanding of these issues and agrees with the plan.   Lucila Maine, DO PGY-3, [facility] 01/24/2018 10:43 AM

## 2018-02-24 ENCOUNTER — Other Ambulatory Visit: Payer: Self-pay | Admitting: Family Medicine

## 2018-03-04 ENCOUNTER — Other Ambulatory Visit: Payer: Self-pay

## 2018-03-04 ENCOUNTER — Ambulatory Visit: Payer: Medicaid Other | Admitting: Family Medicine

## 2018-03-04 VITALS — BP 122/80 | HR 92 | Temp 98.3°F | Wt 156.4 lb

## 2018-03-04 DIAGNOSIS — Q8501 Neurofibromatosis, type 1: Secondary | ICD-10-CM

## 2018-03-04 DIAGNOSIS — K5900 Constipation, unspecified: Secondary | ICD-10-CM | POA: Diagnosis present

## 2018-03-04 LAB — HEMOCCULT GUIAC POC 1CARD (OFFICE): FECAL OCCULT BLD: NEGATIVE

## 2018-03-04 MED ORDER — SENNOSIDES-DOCUSATE SODIUM 8.6-50 MG PO TABS: 1.0000 | ORAL_TABLET | Freq: Every day | ORAL | 0 refills | Status: DC

## 2018-03-04 NOTE — Assessment & Plan Note (Signed)
Acute.  Uncertain etiology.  Negative FOBT and reassuring rectal exam.  Suspect there may be internal hemorrhoids with history of hematochezia.  Given known NF1, would consider GI referral for possible diagnostic colonoscopy. - Ambulatory referral to GI for diagnostic colonoscopy - Advised to begin MiraLAX daily and titrate to achieve regular bowel movements and given senna-S daily along with instructions to use MOM as needed - Reviewed return precautions

## 2018-03-04 NOTE — Progress Notes (Signed)
Subjective   Patient ID: James [name]    DOB: 23-Oct-1975, 42 y.o. male   MRN: [id number]  CC: "I am constipated"  HPI: James [name] is a 42 y.o. male who presents to clinic today for the following:  Constipation: She reports new onset constipation over the last week.  He has been taking okay magnesia daily with some success but feels that he "still has a stool ball."  He has been having some watery diarrhea which she attributes to the milk of magnesia.  He also has some pain upon defecation and reports some hematochezia over the last few days.  He has no prior history of similar symptoms.  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  He denies change of weight or loss of appetite despite having an occasional episode of nonbloody nonbilious emesis over the last week.  Neurofibromatosis type I: Patient not followed by neurology.  He has a history of MRI of the brain which did not show any lesions as of 2014.  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  His lumbar MRI showed no neurofibromas however he did have small disc herniation at S1 and a chronic bone lesion of the medial left iliac which is unchanged as of 2007 consistent with benign etiology.  He reports being told he would see a neurologist back in March but has not heard since.  His deceased brother also had neurofibromatosis type I but passed from a malignant tumor of his right leg.  ROS: see HPI for pertinent.  [place]: NF type I, HTN, smoker, emphysema, alcohol dependence, unspecified thoracic spinal tumor.  Surgical history unremarkable.  Family history DM, HTN, neurofibromatosis.  Smoking status reviewed. Medications reviewed.  Objective   BP 122/80   Pulse 92   Temp 98.3 F (36.8 C) (Oral)   Wt 156 lb 6.4 oz (70.9 kg)   BMI 24.50 kg/m  Vitals and nursing note  reviewed.  General: well nourished, well developed, NAD with non-toxic appearance HEENT: normocephalic, atraumatic, moist mucous membranes Cardiovascular: regular rate and rhythm without murmurs, rubs, or gallops Lungs: clear to auscultation bilaterally with normal work of breathing Abdomen: soft, non-tender, non-distended, normoactive bowel sounds Rectal: no external lesions or fissures, appropriate anal sphincter tone, no palpable nodules or masses, no appreciable stool burden or gross blood on exam Skin: warm, dry, cap refill < 2 seconds, scattered neurofibromas on all surfaces of body Extremities: warm and well perfused, normal tone, no edema  Assessment & Plan   Constipation Acute.  Uncertain etiology.  Negative FOBT and reassuring rectal exam.  Suspect there may be internal hemorrhoids with history of hematochezia.  Given known NF1, would consider GI referral for possible diagnostic colonoscopy. 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  Not currently followed by neurology.  Has a remote referral to neurology back in March which was never followed up.  MRI of brain and central spine approximately 2014-2015 revealing thoracic neurofibroma, otherwise unremarkable. - Ambulatory referral to neurology  Orders Placed This Encounter  Procedures  . Ambulatory referral to Gastroenterology    Referral Priority:   Routine    Referral  Type:   Consultation    Referral Reason:   Specialty Services Required    Number of Visits Requested:   1  . Ambulatory referral to Neurology    Referral Priority:   Routine    Referral Type:   Consultation    Referral Reason:   Specialty Services Required    Requested Specialty:   Neurology    Number of Visits  Requested:   1  . Hemoccult - 1 Card (office)   Meds ordered this encounter  Medications  . senna-docusate (SENOKOT-S) 8.6-50 MG tablet    Sig: Take 1 tablet by mouth daily.    Dispense:  30 tablet    Refill:  0    Harriet [place], DO [place], PGY-3 03/04/2018, 5:40 PM

## 2018-03-04 NOTE — Patient Instructions (Signed)
Thank you for coming in to see [place] today. Please see below to review our plan for today's visit.  1.  I do believe your constipation is unrelated to your neurofibromatosis.  I called in a prescription for senna-S which you will take daily.  Take this with over-the-counter MiraLAX.  This comes in many forms, I prefer the powder form which you can mix with your favorite drink.  You can titrate your MiraLAX until you have a regular bowel movement daily.  Reserve your milk of magnesia only as needed. 2.  I did place a referral for you to undergo a diagnostic colonoscopy.  The GI office will contact to schedule this appointment in the upcoming weeks. 3.  I am not sure what happened regarding the neurology referral back in March.  I did place another referral and talk to our referral agent who will reach out and set up the appointment.  Please call the clinic at [phone] if your symptoms worsen or you have any concerns. It was our pleasure to serve you.  Harriet Butte, [place], PGY-3

## 2018-03-04 NOTE — Assessment & Plan Note (Signed)
Chronic.  Not currently followed by neurology.  Has a remote referral to neurology back in March which was never followed up.  MRI of brain and central spine approximately 2014-2015 revealing thoracic neurofibroma, otherwise unremarkable. - Ambulatory referral to neurology

## 2018-03-10 ENCOUNTER — Encounter: Payer: Self-pay | Admitting: Neurology

## 2018-03-11 ENCOUNTER — Encounter: Payer: Self-pay | Admitting: Gastroenterology

## 2018-04-01 ENCOUNTER — Other Ambulatory Visit (INDEPENDENT_AMBULATORY_CARE_PROVIDER_SITE_OTHER): Payer: Medicaid Other

## 2018-04-01 ENCOUNTER — Ambulatory Visit: Payer: Medicaid Other | Admitting: Gastroenterology

## 2018-04-01 ENCOUNTER — Encounter: Payer: Self-pay | Admitting: Gastroenterology

## 2018-04-01 VITALS — BP 120/84 | HR 78 | Ht 67.5 in | Wt 154.0 lb

## 2018-04-01 DIAGNOSIS — R945 Abnormal results of liver function studies: Secondary | ICD-10-CM

## 2018-04-01 DIAGNOSIS — R195 Other fecal abnormalities: Secondary | ICD-10-CM

## 2018-04-01 DIAGNOSIS — R12 Heartburn: Secondary | ICD-10-CM

## 2018-04-01 DIAGNOSIS — R112 Nausea with vomiting, unspecified: Secondary | ICD-10-CM

## 2018-04-01 DIAGNOSIS — F1020 Alcohol dependence, uncomplicated: Secondary | ICD-10-CM

## 2018-04-01 DIAGNOSIS — K219 Gastro-esophageal reflux disease without esophagitis: Secondary | ICD-10-CM | POA: Diagnosis not present

## 2018-04-01 DIAGNOSIS — R7989 Other specified abnormal findings of blood chemistry: Secondary | ICD-10-CM

## 2018-04-01 LAB — CBC
HEMATOCRIT: 47.4 % (ref 39.0–52.0)
Hemoglobin: 16.5 g/dL (ref 13.0–17.0)
MCHC: 34.8 g/dL (ref 30.0–36.0)
MCV: 95.6 fl (ref 78.0–100.0)
Platelets: 238 10*3/uL (ref 150.0–400.0)
RBC: 4.96 Mil/uL (ref 4.22–5.81)
RDW: 13.5 % (ref 11.5–15.5)
WBC: 7.3 10*3/uL (ref 4.0–10.5)

## 2018-04-01 LAB — IBC PANEL
IRON: 139 ug/dL (ref 42–165)
Saturation Ratios: 33.3 % (ref 20.0–50.0)
TRANSFERRIN: 298 mg/dL (ref 212.0–360.0)

## 2018-04-01 LAB — HEPATIC FUNCTION PANEL
ALBUMIN: 4.4 g/dL (ref 3.5–5.2)
ALK PHOS: 120 U/L — AB (ref 39–117)
ALT: 27 U/L (ref 0–53)
AST: 41 U/L — AB (ref 0–37)
Bilirubin, Direct: 0.2 mg/dL (ref 0.0–0.3)
Total Bilirubin: 0.7 mg/dL (ref 0.2–1.2)
Total Protein: 7.7 g/dL (ref 6.0–8.3)

## 2018-04-01 LAB — BASIC METABOLIC PANEL
BUN: 7 mg/dL (ref 6–23)
CHLORIDE: 94 meq/L — AB (ref 96–112)
CO2: 31 meq/L (ref 19–32)
CREATININE: 0.87 mg/dL (ref 0.40–1.50)
Calcium: 9.9 mg/dL (ref 8.4–10.5)
GFR: 124.37 mL/min (ref >60.00)
Glucose, Bld: 103 mg/dL — ABNORMAL HIGH (ref 70–99)
Potassium: 3.3 mEq/L — ABNORMAL LOW (ref 3.5–5.1)
Sodium: 135 mEq/L (ref 135–145)

## 2018-04-01 LAB — IGA: IgA: 359 mg/dL (ref 68–378)

## 2018-04-01 LAB — PROTIME-INR
INR: 1 ratio (ref 0.8–1.0)
Prothrombin Time: 12.2 s (ref 9.6–13.1)

## 2018-04-01 LAB — LIPASE: Lipase: 29 U/L (ref 11.0–59.0)

## 2018-04-01 LAB — CK: CK TOTAL: 151 U/L (ref 7–232)

## 2018-04-01 LAB — FERRITIN: Ferritin: 592.8 ng/mL — ABNORMAL HIGH (ref 22.0–322.0)

## 2018-04-01 LAB — TSH: TSH: 1.75 u[IU]/mL (ref 0.35–4.50)

## 2018-04-01 MED ORDER — OMEPRAZOLE 40 MG PO CPDR: 40.0000 mg | DELAYED_RELEASE_CAPSULE | Freq: Two times a day (BID) | ORAL | 2 refills | Status: DC

## 2018-04-01 NOTE — Patient Instructions (Signed)
Your provider has requested that you go to the basement level for lab work before leaving today. Press "B" on the elevator. The lab is located at the first door on the left as you exit the elevator.  You have been scheduled for an endoscopy. Please follow written instructions given to you at your visit today. If you use inhalers (even only as needed), please bring them with you on the day of your procedure. Your physician has requested that you go to www.startemmi.com and enter the access code given to you at your visit today. This web site gives a general overview about your procedure. However, you should still follow specific instructions given to you by our office regarding your preparation for the procedure.  We have sent the following medications to your pharmacy for you to pick up at your convenience: Omerpazole  Thank you for entrusting me with your care and choosing [facility].  Dr Rush Landmark

## 2018-04-01 NOTE — Progress Notes (Signed)
[facility] VISIT   Primary Care Provider Rory Percy, [place] [address] [phone]  Referring Provider Menominee Bing, DO [address] [place], [place] [address] [phone]  Patient Profile: James [name] is a 42 y.o. male with a pmh significant for Neurofibromatosis, COPD, Alcohol Abuse, Tobacco Use, GERD, HTN.  The patient presents to the [facility] for an evaluation and management of problem(s) noted below:  Problem List 1. Dark stools   2. Pyrosis   3. Gastroesophageal reflux disease, esophagitis presence not specified   4. Nausea and vomiting, intractability of vomiting not specified, unspecified vomiting type   5. Abnormal LFTs   6. Alcohol use disorder, moderate, dependence (HCC)     History of Present Illness: This is the patient's first visit to the [facility] GI outpatient clinic.  For the last 2 months the patient has been dealing with nausea with associated vomiting on a near daily basis.  The patient describes having increased mucus production after he has eaten.  He does describe some burning sensation in his substernal chest region and has had longer standing issues with that in particular.  He is not had any hematemesis or coffee-ground emesis.  He is not had any weight loss during this time.  He has been able to eat and does not have a food aversion but is concerned because of his appetite overall.  He has been taking 600 mg of ibuprofen for back pain on a regular basis.  He denies any dysphagia or odynophagia.  For the last 2 weeks he is noted a change in the color of his stools to be black in color but most of them are formed.  He did take some recent bismuth/Pepto-Bismol which could be a potential source but that was over 2 weeks ago.  He smokes daily and consumes at least 24 to 30 g of alcohol on a daily basis.  He has never had an upper or lower endoscopy.  GI Review of  Systems Positive as above Negative for jaundice, hematochezia  Review of Systems General: Denies fevers/chills/weight loss/night sweats HEENT: Denies oral lesions Cardiovascular: Denies chest pain Pulmonary: Denies shortness of breath Gastroenterological: See HPI Genitourinary: Denies darkened urine Hematological: Positive for infrequent though present easy bruising/bleeding Dermatological: Denies jaundice Psychological: Mood is anxious Musculoskeletal: Denies new arthralgias   Medications Current Outpatient Medications  Medication Sig Dispense Refill  . chlorhexidine (PERIDEX) 0.12 % solution RINSE 15 ML'S TWICE DAILY AFTER BREAKFAST/BEFORE BEDTIME FOLLOWING BRUSHING AND FLOSSING  0  . doxycycline (VIBRAMYCIN) 100 MG capsule Take 100 mg by mouth 2 (two) times daily.  0  . DULoxetine (CYMBALTA) 30 MG capsule Take 1 capsule (30 mg total) by mouth daily. 90 capsule 3  . hydrochlorothiazide (MICROZIDE) 12.5 MG capsule Take 1 capsule (12.5 mg total) by mouth daily. 90 capsule 3  . ibuprofen (ADVIL,MOTRIN) 600 MG tablet TAKE 1 TABLET BY MOUTH EVERY 8 HOURS AS NEEDED. 30 tablet 0  . ondansetron (ZOFRAN) 4 MG tablet Take 1 tablet (4 mg total) by mouth every 8 (eight) hours as needed for nausea or vomiting. 20 tablet 0  . PROAIR HFA 108 (90 Base) MCG/ACT inhaler TAKE 2 PUFFS BY MOUTH EVERY 6 HOURS AS NEEDED FOR WHEEZE OR SHORTNESS OF BREATH 8.5 Inhaler 1  . tiotropium (SPIRIVA HANDIHALER) 18 MCG inhalation capsule Place 1 capsule (18 mcg total) into inhaler and inhale daily. 30 capsule 12  . omeprazole (PRILOSEC) 40 MG capsule Take 1  capsule (40 mg total) by mouth 2 (two) times daily. 60 capsule 2  . Potassium Chloride ER 20 MEQ TBCR Take 1 tablet by mouth daily for 7 days. 7 tablet 0   No current facility-administered medications for this visit.     Allergies No Known Allergies  Histories Past Medical History:  Diagnosis Date  . Black stools   . Bronchitis    11/2011  . COPD  (chronic obstructive pulmonary disease) ([place])   . Difficulty sleeping   . Dizziness   . ETOH abuse   . Former smoker   . GERD (gastroesophageal reflux disease)   . Hypertension goal BP (blood pressure) < 140/90 12/21/2011  . Neurofibromatosis, type 1 ([place])    diagnosed 2013  . Shortness of breath    with exertion  . Vomiting blood    Past Surgical History:  Procedure Laterality Date  . ESOPHAGOGASTRODUODENOSCOPY (EGD) WITH PROPOFOL  05/14/2012   Procedure: ESOPHAGOGASTRODUODENOSCOPY (EGD) WITH PROPOFOL;  Surgeon: Arta Silence, MD;  Location: [facility];  Service: Endoscopy;  Laterality: N/A;  . none     Social History   Socioeconomic History  . Marital status: Married    Spouse name: Roselyn Reef  . Number of children: 3  . Years of education: 11th  . Highest education level: Not on file  Occupational History  . Occupation: disablity  Social Needs  . Financial resource strain: Not on file  . Food insecurity:    Worry: Not on file    Inability: Not on file  . Transportation needs:    Medical: Not on file    Non-medical: Not on file  Tobacco Use  . Smoking status: Current Every Day Smoker    Packs/day: 0.50    Years: 15.00    Pack years: 7.50    Types: Cigarettes    Start date: 05/28/2002  . Smokeless tobacco: Never Used  . Tobacco comment: tobacco infor given 04/01/18  Substance and Sexual Activity  . Alcohol use: Not on file    Comment: drinks about 1 pint of liquor per day   . Drug use: No  . Sexual activity: Not on file  Lifestyle  . Physical activity:    Days per week: Not on file    Minutes per session: Not on file  . Stress: Not on file  Relationships  . Social connections:    Talks on phone: Not on file    Gets together: Not on file    Attends religious service: Not on file    Active member of club or organization: Not on file    Attends meetings of clubs or organizations: Not on file    Relationship status: Not on file  . Intimate partner violence:     Fear of current or ex partner: Not on file    Emotionally abused: Not on file    Physically abused: Not on file    Forced sexual activity: Not on file  Other Topics Concern  . Not on file  Social History Narrative   Unemployed. Recent patient of Antonietta Jewel, MD of [facility] transferred to our practice after being in jail and losing insurance.       Lives in his parents home with his wife and 2 kids. Completed some High School.    Best contact [phone] and ok to leave a message.       Quit smoking early 7/13. Quit ETOH abuse early 203 after being told he had an upset stomach lining.  Started back smoking 05/2012. 3 cigarettes daily      Caffeine use: very little   Family History  Problem Relation Age of Onset  . Diabetes Mother   . Hypertension Mother   . Hypertension Father   . Diabetes Father   . Prostate cancer Father   . Neurofibromatosis Brother        deceased  . Neurofibromatosis Daughter   . Neurofibromatosis Daughter   . Colon cancer Neg Hx   . Esophageal cancer Neg Hx   . Rectal cancer Neg Hx   . Inflammatory bowel disease Neg Hx   . Liver disease Neg Hx   . Pancreatic cancer Neg Hx    I have reviewed his medical, social, and family history in detail and updated the electronic medical record as necessary.    PHYSICAL EXAMINATION  BP 120/84   Pulse 78   Ht 5' 7.5" (1.715 m)   Wt 154 lb (69.9 kg)   BMI 23.76 kg/m  Wt Readings from Last 3 Encounters:  04/01/18 154 lb (69.9 kg)  03/04/18 156 lb 6.4 oz (70.9 kg)  01/22/18 152 lb (68.9 kg)  GEN: NAD, appears stated age, doesn't appear chronically ill, accompanied by wife as well as son PSYCH: Cooperative EYE: Conjunctivae pink, sclerae anicteric ENT: MMM, without oral ulcers, no erythema or exudates noted NECK: Supple CV: RR without R/Gs  RESP: CTAB posteriorly, without wheezing GI: NABS, soft, tenderness to palpation in the midepigastrium, ND, without rebound or guarding, no HSM  appreciated MSK/EXT: No lower extremity edema present SKIN: No jaundice, no spider angiomata NEURO:  Alert & Oriented x 3, no focal deficits, no evidence of asterixis   REVIEW OF DATA  I reviewed the following data at the time of this encounter:  GI Procedures and Studies  No relevant studies  Laboratory Studies  Reviewed in epic  Imaging Studies  2007 CT abdomen without contrast Impression: 1. Negative for distal ureteral calculus. 2. Benign appearing lesions in the left iliac wing and left superior ramus. Appearance suggests fibrous dysplasia.   ASSESSMENT  Mr. Bukhari is a 42 y.o. male Neurofibromatosis, COPD, Alcohol Abuse, Tobacco Use, GERD, HTN.  The patient is seen today for evaluation and management of:  1. Dark stools   2. Pyrosis   3. Gastroesophageal reflux disease, esophagitis presence not specified   4. Nausea and vomiting, intractability of vomiting not specified, unspecified vomiting type   5. Abnormal LFTs   6. Alcohol use disorder, moderate, dependence ([place])    The patient is hemodynamically stable however describes pretty significant changes in his GI physiology over the course of the last few months.  He is also described a change in his bowel habits with associated dark stools.  With his NSAID use as well as his underlying neurofibromatosis I am concerned about the possibility of an underlying ulcer disease.  He is at risk of neurofibromas and there is high enough risk in my perspective that I think we need to pursue an upper endoscopic evaluation.  He is not currently on a PPI and has been at least 2 weeks since his bismuth so I think it is reasonable to perform an H. pylori stool antigen and treat accordingly.  Once the stool antigen has been sent he can start high-dose PPI therapy 40 mg twice daily.  We will rule out other etiologies for his nausea and vomiting and we will obtain blood work to evaluate and ensure that he is not significantly anemic that would  require an earlier endoscopic evaluation.  This may take some time for [place] to better understand him but we will do what we can to help him in his family.  The risks and benefits of endoscopic evaluation were discussed with the patient; these include but are not limited to the risk of perforation, infection, bleeding, missed lesions, lack of diagnosis, severe illness requiring hospitalization, as well as anesthesia and sedation related illnesses.  I am concerned about the significant amount of alcohol consumption that he has and thus alcoholic gastropathy/gastritis is also my differential and although he does not show stigmata of chronic liver disease he is at risk of this and thus at time of endoscopy we will plan to evaluate for any evidence of portal hypertension.  He does not recall being evaluated for abnormal liver tests in the past however he does have abnormal liver test which may correlate with his alcohol consumption but we will plan a basic laboratory evaluation.  No plan for liver biopsy currently.  I also discussed the role of decreasing alcohol consumption on overall health as well as the role of decreasing tobacco use.  The patient is agreeable to proceed.   All patient questions were answered, to the best of my ability, and the patient agrees to the aforementioned plan of action with follow-up as indicated.   PLAN  1. Dark stools - Anemia evaluation and Iron indices - EGD ordered (will change timing based on anemia labs)  2. Pyrosis  3. Gastroesophageal reflux disease, esophagitis presence not specified - Once HP stool study given he will begin 40 mg BID PPI  4. Nausea and vomiting, intractability of vomiting not specified, unspecified vomiting type - Ambulatory referral to Gastroenterology - CBC; Future - Basic metabolic panel; Future - Lipase; Future - Hepatic function panel; Future - TSH; Future - IBC panel; Future - Ferritin; Future - Tissue transglutaminase, IgA; Future -  IgA; Future - Helicobacter pylori special antigen; Future  5. Abnormal LFTs - Hepatic function panel; Future - Protime-INR; Future - Hepatitis A antibody, total; Future - Hepatitis B surface antibody,qualitative; Future - Hepatitis B surface antigen; Future - Hepatitis B core antibody, total; Future - Hepatitis C antibody; Future - CK (Creatine Kinase); Future  6. Alcohol use disorder, moderate, dependence ([place]) - Asked to decrease use to 1/2 current dose in next month if possible - He defers on other services currently but thinks he can do this  *Plan for PPI trial high dose * Plan for EGD/Colon * Decrease Alcohol consumption  Orders Placed This Encounter  Procedures  . Helicobacter pylori special antigen  . CBC  . Basic metabolic panel  . Lipase  . Hepatic function panel  . TSH  . IBC panel  . Ferritin  . Tissue transglutaminase, IgA  . IgA  . Protime-INR  . Hepatitis A antibody, total  . Hepatitis B surface antibody,qualitative  . Hepatitis B surface antigen  . Hepatitis B core antibody, total  . Hepatitis C antibody  . CK (Creatine Kinase)  . Ambulatory referral to Gastroenterology    New Prescriptions   OMEPRAZOLE (PRILOSEC) 40 MG CAPSULE    Take 1 capsule (40 mg total) by mouth 2 (two) times daily.   POTASSIUM CHLORIDE ER 20 MEQ TBCR    Take 1 tablet by mouth daily for 7 days.   Modified Medications   No medications on file    Planned Follow Up: No follow-ups on file.   Justice Britain, MD [facility] Office #  [phone]  

## 2018-04-02 ENCOUNTER — Other Ambulatory Visit: Payer: Medicaid Other

## 2018-04-02 DIAGNOSIS — K219 Gastro-esophageal reflux disease without esophagitis: Secondary | ICD-10-CM

## 2018-04-02 DIAGNOSIS — R12 Heartburn: Secondary | ICD-10-CM

## 2018-04-02 DIAGNOSIS — R945 Abnormal results of liver function studies: Secondary | ICD-10-CM

## 2018-04-02 DIAGNOSIS — R7989 Other specified abnormal findings of blood chemistry: Secondary | ICD-10-CM

## 2018-04-02 DIAGNOSIS — R195 Other fecal abnormalities: Secondary | ICD-10-CM

## 2018-04-02 DIAGNOSIS — R112 Nausea with vomiting, unspecified: Secondary | ICD-10-CM

## 2018-04-02 LAB — TISSUE TRANSGLUTAMINASE, IGA: (TTG) AB, IGA: 1 U/mL

## 2018-04-02 LAB — HEPATITIS B SURFACE ANTIBODY,QUALITATIVE: Hep B S Ab: NONREACTIVE

## 2018-04-02 LAB — HEPATITIS A ANTIBODY, TOTAL: HEPATITIS A AB,TOTAL: NONREACTIVE

## 2018-04-02 LAB — HEPATITIS B CORE ANTIBODY, TOTAL: HEP B C TOTAL AB: NONREACTIVE

## 2018-04-02 LAB — HEPATITIS C ANTIBODY
Hepatitis C Ab: NONREACTIVE
SIGNAL TO CUT-OFF: 0.02 (ref <1.00)

## 2018-04-02 LAB — HEPATITIS B SURFACE ANTIGEN: Hepatitis B Surface Ag: NONREACTIVE

## 2018-04-03 LAB — HELICOBACTER PYLORI  SPECIAL ANTIGEN
MICRO NUMBER:: 91336771
SPECIMEN QUALITY: ADEQUATE

## 2018-04-04 ENCOUNTER — Other Ambulatory Visit: Payer: Self-pay

## 2018-04-04 DIAGNOSIS — R945 Abnormal results of liver function studies: Secondary | ICD-10-CM

## 2018-04-04 DIAGNOSIS — R7989 Other specified abnormal findings of blood chemistry: Secondary | ICD-10-CM

## 2018-04-04 MED ORDER — POTASSIUM CHLORIDE ER 20 MEQ PO TBCR: 1.0000 | EXTENDED_RELEASE_TABLET | Freq: Every day | ORAL | 0 refills | Status: DC

## 2018-04-06 ENCOUNTER — Encounter: Payer: Self-pay | Admitting: Gastroenterology

## 2018-04-06 DIAGNOSIS — F1020 Alcohol dependence, uncomplicated: Secondary | ICD-10-CM

## 2018-04-06 DIAGNOSIS — R945 Abnormal results of liver function studies: Secondary | ICD-10-CM | POA: Insufficient documentation

## 2018-04-06 DIAGNOSIS — R7989 Other specified abnormal findings of blood chemistry: Secondary | ICD-10-CM | POA: Insufficient documentation

## 2018-04-06 DIAGNOSIS — R112 Nausea with vomiting, unspecified: Secondary | ICD-10-CM | POA: Insufficient documentation

## 2018-04-06 DIAGNOSIS — R12 Heartburn: Secondary | ICD-10-CM | POA: Insufficient documentation

## 2018-04-06 DIAGNOSIS — K219 Gastro-esophageal reflux disease without esophagitis: Secondary | ICD-10-CM | POA: Insufficient documentation

## 2018-04-06 DIAGNOSIS — R195 Other fecal abnormalities: Secondary | ICD-10-CM | POA: Insufficient documentation

## 2018-04-06 HISTORY — DX: Alcohol dependence, uncomplicated: F10.20

## 2018-04-07 ENCOUNTER — Ambulatory Visit (INDEPENDENT_AMBULATORY_CARE_PROVIDER_SITE_OTHER): Payer: Medicaid Other | Admitting: Gastroenterology

## 2018-04-07 DIAGNOSIS — Z23 Encounter for immunization: Secondary | ICD-10-CM | POA: Diagnosis not present

## 2018-04-14 ENCOUNTER — Ambulatory Visit (INDEPENDENT_AMBULATORY_CARE_PROVIDER_SITE_OTHER): Payer: Medicaid Other | Admitting: Gastroenterology

## 2018-04-14 DIAGNOSIS — Z23 Encounter for immunization: Secondary | ICD-10-CM

## 2018-04-16 ENCOUNTER — Encounter: Payer: Self-pay | Admitting: Gastroenterology

## 2018-04-16 ENCOUNTER — Ambulatory Visit (AMBULATORY_SURGERY_CENTER): Payer: Medicaid Other | Admitting: Gastroenterology

## 2018-04-16 VITALS — BP 140/99 | HR 94 | Temp 97.7°F | Resp 22 | Ht 67.5 in | Wt 154.0 lb

## 2018-04-16 DIAGNOSIS — R195 Other fecal abnormalities: Secondary | ICD-10-CM

## 2018-04-16 DIAGNOSIS — K2980 Duodenitis without bleeding: Secondary | ICD-10-CM | POA: Diagnosis not present

## 2018-04-16 DIAGNOSIS — G709 Myoneural disorder, unspecified: Secondary | ICD-10-CM

## 2018-04-16 DIAGNOSIS — K2970 Gastritis, unspecified, without bleeding: Secondary | ICD-10-CM

## 2018-04-16 DIAGNOSIS — K449 Diaphragmatic hernia without obstruction or gangrene: Secondary | ICD-10-CM | POA: Diagnosis not present

## 2018-04-16 MED ORDER — SODIUM CHLORIDE 0.9 % IV SOLN: 500.0000 mL | Freq: Once | INTRAVENOUS | Status: DC

## 2018-04-16 NOTE — Op Note (Addendum)
[facility] Patient Name: James [name] Procedure Date: 04/16/2018 11:43 AM MRN: [id number] Endoscopist: Justice Britain , MD Age: 42 Referring MD:  Date of Birth: May 06, 1976 Gender: Male Account #: [id number] Procedure:                Upper GI endoscopy Indications:              Epigastric abdominal pain, Dyspepsia, Heartburn,                            Melena, Nausea with vomiting Medicines:                Monitored Anesthesia Care Procedure:                Pre-Anesthesia Assessment:                           - Prior to the procedure, a History and Physical                            was performed, and patient medications and                            allergies were reviewed. The patient's tolerance of                            previous anesthesia was also reviewed. The risks                            and benefits of the procedure and the sedation                            options and risks were discussed with the patient.                            All questions were answered, and informed consent                            was obtained. Prior Anticoagulants: The patient has                            taken previous NSAID medication. ASA Grade                            Assessment: II - A patient with mild systemic                            disease. After reviewing the risks and benefits,                            the patient was deemed in satisfactory condition to                            undergo the procedure.  After obtaining informed consent, the endoscope was                            passed under direct vision. Throughout the                            procedure, the patient's blood pressure, pulse, and                            oxygen saturations were monitored continuously. The                            Endoscope was introduced through the mouth, and                            advanced to the second part of duodenum. The  upper                            GI endoscopy was accomplished without difficulty.                            The patient tolerated the procedure. Scope In: Scope Out: Findings:                 No gross lesions were noted in the entire                            esophagus. Biopsies were taken with a cold forceps                            for histology to rule out EoE from the                            proximal/middle/distal esophagus.                           The Z-line was irregular and was found 39.5 cm from                            the incisors.                           A small hiatal hernia was found. The proximal                            extent of the gastric folds (end of tubular                            esophagus) was 40 cm from the incisors. The hiatal                            narrowing was 42 cm from the incisors.  Striped moderately erythematous mucosa without                            bleeding was found in the gastric antrum.                           No other gross lesions were noted in the entire                            examined stomach. Biopsies were taken with a cold                            forceps for histology and Helicobacter pylori                            testing from the antrum/incisura/greater                            curve/lesser curve/cardia/fundus.                           No gross lesions were noted in the duodenal bulb,                            in the first portion of the duodenum and in the                            second portion of the duodenum. Biopsies for                            histology were taken with a cold forceps for                            evaluation of celiac disease and rule out                            enteropathy. Complications:            No immediate complications. Estimated Blood Loss:     Estimated blood loss was minimal. Impression:               - No gross lesions in esophagus.  Biopsied.                           - Z-line irregular, 39 cm from the incisors.                           - Small hiatal hernia.                           - Erythematous mucosa in the antrum.                           - No gross lesions in the stomach. Biopsied.                           -  No gross lesions in the duodenal bulb, in the                            first portion of the duodenum and in the second                            portion of the duodenum. Biopsied. Recommendation:           - The patient will be observed post-procedure,                            until all discharge criteria are met.                           - Discharge patient to home.                           - Patient has a contact number available for                            emergencies. The signs and symptoms of potential                            delayed complications were discussed with the                            patient. Return to normal activities tomorrow.                            Written discharge instructions were provided to the                            patient.                           - Resume previous diet.                           - Continue present medications including PPI at                            current dosing until follow up.                           - Await pathology results. If positive for HP then                            treat with quadruple therapy.                           - If patient continues to have issues of dark                            stools will have to consider role of colonoscopy,  however, no evidence of IDA or overt anemia. Will                            need continued monitoring.                           - The findings and recommendations were discussed                            with the patient.                           - The findings and recommendations were discussed                            with the patient's  family. Justice Britain, MD 04/16/2018 12:18:46 PM

## 2018-04-16 NOTE — Progress Notes (Signed)
Called to room to assist during endoscopic procedure.  Patient ID and intended procedure confirmed with present staff. Received instructions for my participation in the procedure from the performing physician.  

## 2018-04-16 NOTE — Patient Instructions (Signed)
**  handout given on Hiatal hernia**   YOU HAD AN ENDOSCOPIC PROCEDURE TODAY: Refer to the procedure report and other information in the discharge instructions given to you for any specific questions about what was found during the examination. If this information does not answer your questions, please call [place] office at [phone] to clarify.   YOU SHOULD EXPECT: Some feelings of bloating in the abdomen. Passage of more gas than usual. Walking can help get rid of the air that was put into your GI tract during the procedure and reduce the bloating. If you had a lower endoscopy (such as a colonoscopy or flexible sigmoidoscopy) you may notice spotting of blood in your stool or on the toilet paper. Some abdominal soreness may be present for a day or two, also.  DIET: Your first meal following the procedure should be a light meal and then it is ok to progress to your normal diet. A half-sandwich or bowl of soup is an example of a good first meal. Heavy or fried foods are harder to digest and may make you feel nauseous or bloated. Drink plenty of fluids but you should avoid alcoholic beverages for 24 hours. If you had a esophageal dilation, please see attached instructions for diet.    ACTIVITY: Your care partner should take you home directly after the procedure. You should plan to take it easy, moving slowly for the rest of the day. You can resume normal activity the day after the procedure however YOU SHOULD NOT DRIVE, use power tools, machinery or perform tasks that involve climbing or major physical exertion for 24 hours (because of the sedation medicines used during the test).   SYMPTOMS TO REPORT IMMEDIATELY: A gastroenterologist can be reached at any hour. Please call [phone]  for any of the following symptoms:   Following upper endoscopy (EGD, EUS, ERCP, esophageal dilation) Vomiting of blood or coffee ground material  New, significant abdominal pain  New, significant chest pain or pain  under the shoulder blades  Painful or persistently difficult swallowing  New shortness of breath  Black, tarry-looking or red, bloody stools  FOLLOW UP:  If any biopsies were taken you will be contacted by phone or by letter within the next 1-3 weeks. Call [phone]  if you have not heard about the biopsies in 3 weeks.  Please also call with any specific questions about appointments or follow up tests.

## 2018-04-16 NOTE — Progress Notes (Signed)
Verified by reviewing Dr. Donneta Romberg office visit and with patient that he is having an EGD only, consent signed for EGD only.

## 2018-04-16 NOTE — Progress Notes (Signed)
A and O x3. Report to RN. Tolerated MAC anesthesia well.Teeth unchanged after procedure.

## 2018-04-17 ENCOUNTER — Telehealth: Payer: Self-pay | Admitting: *Deleted

## 2018-04-17 NOTE — Telephone Encounter (Signed)
  Follow up Call-  Call back number 04/16/2018  Post procedure Call Back phone  # [phone]  Permission to leave phone message Yes  Some recent data might be hidden     Patient questions:  Do you have a fever, pain , or abdominal swelling? No. Pain Score  0 *  Have you tolerated food without any problems? Yes.    Have you been able to return to your normal activities? Yes.    Do you have any questions about your discharge instructions: Diet   No. Medications  No. Follow up visit  No.  Do you have questions or concerns about your Care? No.  Actions: * If pain score is 4 or above: No action needed, pain <4.

## 2018-04-23 ENCOUNTER — Encounter: Payer: Self-pay | Admitting: Gastroenterology

## 2018-04-28 ENCOUNTER — Ambulatory Visit (INDEPENDENT_AMBULATORY_CARE_PROVIDER_SITE_OTHER): Payer: Medicaid Other | Admitting: Gastroenterology

## 2018-04-28 DIAGNOSIS — Z23 Encounter for immunization: Secondary | ICD-10-CM

## 2018-05-22 ENCOUNTER — Emergency Department (HOSPITAL_COMMUNITY)
Admission: EM | Admit: 2018-05-22 | Discharge: 2018-05-22 | Disposition: A | Discharge status: Home / Self Care | Payer: Medicaid Other | Attending: Emergency Medicine | Admitting: Emergency Medicine

## 2018-05-22 ENCOUNTER — Emergency Department (HOSPITAL_COMMUNITY): Payer: Medicaid Other

## 2018-05-22 ENCOUNTER — Other Ambulatory Visit: Payer: Self-pay

## 2018-05-22 ENCOUNTER — Encounter (HOSPITAL_COMMUNITY): Payer: Self-pay | Admitting: Emergency Medicine

## 2018-05-22 DIAGNOSIS — Z79899 Other long term (current) drug therapy: Secondary | ICD-10-CM | POA: Insufficient documentation

## 2018-05-22 DIAGNOSIS — J45909 Unspecified asthma, uncomplicated: Secondary | ICD-10-CM | POA: Diagnosis not present

## 2018-05-22 DIAGNOSIS — F17210 Nicotine dependence, cigarettes, uncomplicated: Secondary | ICD-10-CM | POA: Diagnosis not present

## 2018-05-22 DIAGNOSIS — J449 Chronic obstructive pulmonary disease, unspecified: Secondary | ICD-10-CM | POA: Diagnosis not present

## 2018-05-22 DIAGNOSIS — R05 Cough: Secondary | ICD-10-CM | POA: Diagnosis present

## 2018-05-22 DIAGNOSIS — J189 Pneumonia, unspecified organism: Secondary | ICD-10-CM | POA: Diagnosis not present

## 2018-05-22 DIAGNOSIS — J181 Lobar pneumonia, unspecified organism: Secondary | ICD-10-CM

## 2018-05-22 MED ORDER — PREDNISONE 20 MG PO TABS
60.0000 mg | ORAL_TABLET | Freq: Once | ORAL | Status: AC
  Administered 2018-05-22: 60 mg via ORAL
  Filled 2018-05-22: qty 3

## 2018-05-22 MED ORDER — DOXYCYCLINE HYCLATE 100 MG PO CAPS: 100.0000 mg | ORAL_CAPSULE | Freq: Two times a day (BID) | ORAL | 0 refills | Status: AC

## 2018-05-22 MED ORDER — IPRATROPIUM-ALBUTEROL 0.5-2.5 (3) MG/3ML IN SOLN
3.0000 mL | Freq: Once | RESPIRATORY_TRACT | Status: AC
  Administered 2018-05-22: 3 mL via RESPIRATORY_TRACT
  Filled 2018-05-22: qty 3

## 2018-05-22 NOTE — ED Triage Notes (Addendum)
Pt reports productive cough x 3 days. Pt denies sick contact. Pt denies nasal congestion or fever. Pt reports he had his flu shot this year.

## 2018-05-22 NOTE — ED Notes (Signed)
Patient transported to X-ray 

## 2018-05-22 NOTE — Discharge Instructions (Addendum)
Your evaluated today for cough.  Your chest x-ray was positive for pneumonia.  I have prescribed you antibiotics.  Please take as prescribed.  Follow up with PCP for reevaluation if you continue to have symptoms next week.  Return to the ED for any new or worsening symptoms

## 2018-05-22 NOTE — ED Provider Notes (Signed)
[facility] EMERGENCY DEPARTMENT Provider Note   CSN: [id number] Arrival date & time: 05/22/18  1642   History   Chief Complaint Chief Complaint  Patient presents with  . Cough    HPI James [name] is a 42 y.o. male with past medical history significant for COPD, chronic alcohol use, hypertension, chronic shortness of breath who presents for evaluation of cough.  Patient states he has had a productive cough x3 days.  States cough is productive of white and light yellow sputum.  Denies hemoptysis.  Patient states he has used his albuterol inhaler without relief of symptoms.  Admits to Influenza vaccine.  Denies fever, chills, nausea, vomiting, nasal congestion, rhinorrhea, chest pain, shortness of breath, abdominal pain, diarrhea.  Patient states "wheezy.  Denies bilateral lower extremity swelling.  Denies any known sick contacts, however states "I been around a lot of people."  History provided by patient.  No interpreter was used.  HPI  Past Medical History:  Diagnosis Date  . Asthma   . Black stools   . Bronchitis    11/2011  . COPD (chronic obstructive pulmonary disease) ([place])   . Difficulty sleeping   . Dizziness   . ETOH abuse   . Former smoker   . GERD (gastroesophageal reflux disease)   . Hypertension goal BP (blood pressure) < 140/90 12/21/2011  . 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 Neuromuscular disorder ([place]) 2014   neurofibrosis  . Shortness of breath    with exertion  . Vomiting blood     Patient Active Problem List   Diagnosis Date Noted  . Alcohol use disorder, moderate, dependence ([place]) 04/06/2018  . Abnormal LFTs 04/06/2018  . Nausea and vomiting 04/06/2018  . Gastroesophageal reflux disease 04/06/2018  . Pyrosis 04/06/2018  . Dark stools 04/06/2018  . Constipation 03/04/2018  . Epigastric pain 01/22/2018  . Alcohol dependence with uncomplicated withdrawal ([place]) 06/07/2017  . Emphysema lung ([place]) 10/24/2016  .  Skin lesion 04/25/2015  . Cough 09/17/2014  . Healthcare maintenance 09/17/2014  . Cigarette smoker 05/11/2014  . 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 Vitamin D insufficiency 06/25/2013  . Neuromuscular disorder ([place]) 05/28/2012  . Backache 03/17/2012  . Oral mucosal lesion 01/01/2012  . Hypertension 12/21/2011  . 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 ESOPHAGOGASTRODUODENOSCOPY (EGD) WITH PROPOFOL  05/14/2012   Procedure: ESOPHAGOGASTRODUODENOSCOPY (EGD) WITH PROPOFOL;  Surgeon: Arta Silence, MD;  Location: [facility] ENDOSCOPY;  Service: Endoscopy;  Laterality: N/A;  . none    . UPPER GASTROINTESTINAL ENDOSCOPY          Home Medications    Prior to Admission medications   Medication Sig Start Date End Date Taking? Authorizing Provider  chlorhexidine (PERIDEX) 0.12 % solution RINSE 15 ML'S TWICE DAILY AFTER BREAKFAST/BEFORE BEDTIME FOLLOWING BRUSHING AND FLOSSING 09/02/17   [provider]  doxycycline (VIBRAMYCIN) 100 MG capsule Take 1 capsule (100 mg total) by mouth 2 (two) times daily for 7 days. 05/22/18 05/29/18  Aubert Choyce A, PA-C  DULoxetine (CYMBALTA) 30 MG capsule Take 1 capsule (30 mg total) by mouth daily. 02/19/17   Rory Percy, DO  hydrochlorothiazide (MICROZIDE) 12.5 MG capsule Take 1 capsule (12.5 mg total) by mouth daily. 10/03/17   Rory Percy, DO  ibuprofen (ADVIL,MOTRIN) 600 MG tablet TAKE 1 TABLET BY MOUTH EVERY 8 HOURS AS NEEDED. 02/24/18   Rory Percy, DO  omeprazole (PRILOSEC) 40 MG capsule  Take 1 capsule (40 mg total) by mouth 2 (two) times daily. 04/01/18   Mansouraty, Telford Nab., MD  ondansetron (ZOFRAN) 4 MG tablet Take 1 tablet (4 mg total) by mouth every 8 (eight) hours as needed for nausea or vomiting. 01/22/18   Steve Rattler, DO  Potassium Chloride ER 20 MEQ TBCR Take 1 tablet by mouth daily for 7 days. 04/04/18 04/11/18  Mansouraty, Telford Nab., MD  PROAIR HFA 108  [phone] Base) MCG/ACT inhaler TAKE 2 PUFFS BY MOUTH EVERY 6 HOURS AS NEEDED FOR WHEEZE OR SHORTNESS OF BREATH 02/24/18   Rory Percy, DO  tiotropium (SPIRIVA HANDIHALER) 18 MCG inhalation capsule Place 1 capsule (18 mcg total) into inhaler and inhale daily. 10/16/17   Rory Percy, DO    Family History Family History  Problem Relation Age of Onset  . Diabetes Mother   . Hypertension Mother   . Hypertension Father   . Diabetes Father   . Prostate cancer Father   . Neurofibromatosis Brother        deceased  . Neurofibromatosis Daughter   . Neurofibromatosis Daughter   . Colon cancer Neg Hx   . Esophageal cancer Neg Hx   . Rectal cancer Neg Hx   . Inflammatory bowel disease Neg Hx   . Liver disease Neg Hx   . Pancreatic cancer Neg Hx   . Stomach cancer Neg Hx     Social History Social History   Tobacco Use  . Smoking status: Current Every Day Smoker    Packs/day: 0.50    Years: 15.00    Pack years: 7.50    Types: Cigarettes    Start date: 05/28/2002  . Smokeless tobacco: Never Used  . Tobacco comment: tobacco infor given 04/01/18  Substance Use Topics  . Alcohol use: Yes    Comment: drinks about 1 pint of liquor per day   . Drug use: No     Allergies   Patient has no known allergies.   Review of Systems Review of Systems  Constitutional: Negative.   HENT: Negative.   Respiratory: Positive for cough and wheezing. Negative for apnea, choking, chest tightness, shortness of breath and stridor.   Cardiovascular: Negative.   Gastrointestinal: Negative.   Genitourinary: Negative.   Musculoskeletal: Negative.   Skin: Negative.   Neurological: Negative.   All other systems reviewed and are negative.    Physical Exam Updated Vital Signs BP (!) 151/107 (BP Location: Right Arm)   Pulse 98   Temp 98 F (36.7 C) (Oral)   Resp 16   SpO2 96%   Physical Exam Vitals signs and nursing note reviewed.  Constitutional:      General: He is not in acute distress.     Appearance: He is well-developed. He is not ill-appearing, toxic-appearing or diaphoretic.  HENT:     Head: Normocephalic and atraumatic.     Nose: Nose normal. No congestion or rhinorrhea.     Mouth/Throat:     Mouth: Mucous membranes are moist.  Eyes:     Pupils: Pupils are equal, round, and reactive to light.  Neck:     Musculoskeletal: Normal range of motion and neck supple.  Cardiovascular:     Rate and Rhythm: Normal rate and regular rhythm.     Pulses: Normal pulses.     Heart sounds: Normal heart sounds. No murmur. No gallop.   Pulmonary:     Effort: Pulmonary effort is normal. No tachypnea, accessory muscle usage, prolonged expiration, respiratory distress or  retractions.     Breath sounds: No stridor or decreased air movement. Examination of the left-upper field reveals wheezing. Examination of the left-middle field reveals wheezing. Examination of the left-lower field reveals wheezing. Wheezing present. No decreased breath sounds, rhonchi or rales.     Comments: Able to speak in full sentences without difficulty.  No accessory muscle usage.  No prolonged expiration.  Does not appear in any acute respiratory distress.  Mild wheezing to left lung fields. Chest:     Comments: No chest wall tenderness to palpation. Abdominal:     General: Bowel sounds are normal. There is no distension.     Palpations: Abdomen is soft.     Tenderness: There is no abdominal tenderness.     Comments: Soft, nontender without rebound or guarding.  Musculoskeletal: Normal range of motion.     Comments: Moves all extremities without difficulty.  Patient has been able to ambulate in hall without difficulty.  Skin:    General: Skin is warm and dry.     Comments: No lower extremity edema.  Neurological:     Mental Status: He is alert.      ED Treatments / Results  Labs (all labs ordered are listed, but only abnormal results are displayed) Labs Reviewed - No data to  display  EKG None  Radiology Dg Chest 2 View  Result Date: 05/22/2018 CLINICAL DATA:  Cough EXAM: CHEST - 2 VIEW COMPARISON:  January 22, 2018 FINDINGS: There is airspace consolidation in the anterior segment and axillary subsegment regions of the right upper lobe. The lungs elsewhere are clear. Heart size and pulmonary vascularity are normal. No adenopathy. No bone lesions. IMPRESSION: Areas of right upper lobe pneumonia. Lungs elsewhere clear. No adenopathy evident. Electronically Signed   By: Lowella Grip III M.D.   On: 05/22/2018 19:58    Procedures Procedures (including critical care time)  Medications Ordered in ED Medications  ipratropium-albuterol (DUONEB) 0.5-2.5 (3) MG/3ML nebulizer solution 3 mL (3 mLs Nebulization Given 05/22/18 1822)  predniSONE (DELTASONE) tablet 60 mg (60 mg Oral Given 05/22/18 1822)     Initial Impression / Assessment and Plan / ED Course  I have reviewed the triage vital signs and the nursing notes.  Pertinent labs & imaging results that were available during my care of the patient were reviewed by me and considered in my medical decision making (see chart for details).  42 year old male appears otherwise well presents for evaluation of productive cough.  Onset 3 days ago.  Patient has been using his home albuterol inhaler which he uses for COPD for symptomatic relief.  Patient states he feels "wheezy."  Mild wheezing to left lung.  Able to speak in full sentences without difficulty.  Oxygen saturation 99% on room air with good waveform. Does Not appear in any acute respiratory distress.  Denies fever, chills, rhinorrhea nasal congestion, body aches and pains.  Did receive influenza vaccine.  No lower extremity edema.  Patient does not meet Sirs or sepsis criteria.  Will obtain chest x-ray, steroids and nebulizer and re-evaluate.  Patient does have mildly elevated blood pressure department.  Has history of hypertension.  He is asymptomatic without  chest pain, shortness of breath, headache or vision changes.  Discussed with patient follow-up with PCP for reevaluation.  Clear to auscultation on reevaluation.  Chest x-ray with right upper lobe pneumonia.  Will DC home on antibiotics.  Patient does not want IV Rocephin in the emergency department.  Patient is hemodynamically stable.  Oxygen saturation 96% on room air.  Able to speak in full sentences without difficulty.  Patient is able to ambulate in halls with oxygen saturations above 95%.  He does not have dyspnea on exertion.  Discussed with patient follow-up PCP for reevaluation.  Appropriate for DC home at this time.  Discussed strict return precautions.  Patient voiced understanding and is agreeable for follow-up.    Final Clinical Impressions(s) / ED Diagnoses   Final diagnoses:  Community acquired pneumonia of right upper lobe of lung [facility])    ED Discharge Orders         Ordered    doxycycline (VIBRAMYCIN) 100 MG capsule  2 times daily     05/22/18 2103           Bethenny Losee A, PA-C 05/22/18 2107    Deno Etienne, DO 05/23/18 1523

## 2018-05-30 ENCOUNTER — Other Ambulatory Visit: Payer: Self-pay | Admitting: Gastroenterology

## 2018-05-30 ENCOUNTER — Encounter: Payer: Self-pay | Admitting: Gastroenterology

## 2018-05-30 ENCOUNTER — Ambulatory Visit (INDEPENDENT_AMBULATORY_CARE_PROVIDER_SITE_OTHER): Payer: Medicaid Other | Admitting: Gastroenterology

## 2018-05-30 ENCOUNTER — Other Ambulatory Visit (INDEPENDENT_AMBULATORY_CARE_PROVIDER_SITE_OTHER): Payer: Medicaid Other

## 2018-05-30 VITALS — BP 130/100 | HR 88 | Ht 68.0 in | Wt 161.4 lb

## 2018-05-30 DIAGNOSIS — R195 Other fecal abnormalities: Secondary | ICD-10-CM

## 2018-05-30 DIAGNOSIS — R945 Abnormal results of liver function studies: Secondary | ICD-10-CM

## 2018-05-30 DIAGNOSIS — K219 Gastro-esophageal reflux disease without esophagitis: Secondary | ICD-10-CM

## 2018-05-30 DIAGNOSIS — R7989 Other specified abnormal findings of blood chemistry: Secondary | ICD-10-CM

## 2018-05-30 DIAGNOSIS — F1020 Alcohol dependence, uncomplicated: Secondary | ICD-10-CM

## 2018-05-30 LAB — CBC
HCT: 45 % (ref 39.0–52.0)
Hemoglobin: 15.6 g/dL (ref 13.0–17.0)
MCHC: 34.6 g/dL (ref 30.0–36.0)
MCV: 96.6 fl (ref 78.0–100.0)
Platelets: 271 10*3/uL (ref 150.0–400.0)
RBC: 4.67 Mil/uL (ref 4.22–5.81)
RDW: 13.8 % (ref 11.5–15.5)
WBC: 6.1 10*3/uL (ref 4.0–10.5)

## 2018-05-30 LAB — HEPATIC FUNCTION PANEL
ALT: 18 U/L (ref 0–53)
AST: 38 U/L — ABNORMAL HIGH (ref 0–37)
Albumin: 3.9 g/dL (ref 3.5–5.2)
Alkaline Phosphatase: 99 U/L (ref 39–117)
Bilirubin, Direct: 0.1 mg/dL (ref 0.0–0.3)
Total Bilirubin: 0.5 mg/dL (ref 0.2–1.2)
Total Protein: 7 g/dL (ref 6.0–8.3)

## 2018-05-30 LAB — FERRITIN: Ferritin: 457.5 ng/mL — ABNORMAL HIGH (ref 22.0–322.0)

## 2018-05-30 LAB — IBC PANEL
Iron: 199 ug/dL — ABNORMAL HIGH (ref 42–165)
Saturation Ratios: 60.7 % — ABNORMAL HIGH (ref 20.0–50.0)
Transferrin: 234 mg/dL (ref 212.0–360.0)

## 2018-05-30 NOTE — Progress Notes (Signed)
[facility]   Primary Care Provider Rory Percy, [place] [address] [phone]  Patient Profile: James [name] is a 43 y.o. male with a pmh significant for Neurofibromatosis, COPD, Alcohol Abuse Disorder, Tobacco Use, GERD, HTN.  The patient presents to the [facility] for an evaluation and management of problem(s) noted below:  Problem List 1. Gastroesophageal reflux disease, esophagitis presence not specified   2. Alcohol use disorder, moderate, dependence ([place])   3. Abnormal LFTs   4. Dark stools     History of Present Illness: Please see initial consultation note for full details of HPI.  In brief, when the patient first was evaluated his biggest concerns were daily nausea with vomiting for approximately 2 to 3 months.  He had a burning sensation occurring suggestive of underlying GERD/heartburn.  He had been taking high doses of ibuprofen on a regular basis and also was a consumer of high amounts of alcohol on a daily basis we decided to have the patient undergo an upper endoscopy because of reported dark stools though hemogram was normal at the time.  His upper endoscopy results showed endoscopically normal-appearing stomach but on biopsy showed mild chronic gastritis and there was some superficial peptic duodenitis noted on biopsies obtained from the duodenum.  His random esophageal biopsies showed no evidence of overt eosinophilia.  He was asked to maintain a PPI twice daily.  Interval History Today, the patient returns in follow-up as previously scheduled.  He and his wife are extremely happy because the patient's symptoms of nausea and vomiting are no longer present.  The patient has also stopped all hard liquor and is only drinking 3-4 low alcohol content beers per day no more than an 18 to 24 pack/week.  This is a significant decrease in how much alcohol he was previously drinking.  The patient  describes some symptoms around the time of stopping his hard liquor of withdrawal type symptoms in regards to shaking and agitation.  He has been drinking the alcohol like beers as a result of trying to maintain himself from withdrawing more significantly.  He states the past he had tried AA but did not find it effective.  He is not sure if he is been be able to cut himself completely off.  He describes a difficult situation with prior attempt at detox which he found to be extremely difficult to do because of issues surrounding his financials.  Patient has no abdominal pain.  He had noted a slight darkened stool at some point in the last month and a half however that has normalized and is having normal bowel movements currently.  He denies any overt hematochezia.  He denies any dysphagia or odynophagia.  He has actually gained weight.  He is currently undergoing vaccination for hepatitis B and hepatitis A.  He is still taking nonsteroidals on an infrequent basis.  GI Review of Systems Positive as above Negative for bloating, early satiety, jaundice, change in bowel habits  Review of Systems General: Denies fevers/chills/weight loss Cardiovascular: Denies chest pain Pulmonary: Denies shortness of breath Gastroenterological: See HPI Genitourinary: Denies darkened urine Hematological: Bruising and bleeding are stable Dermatological: Denies jaundice Psychological: Mood is happy   Medications Current Outpatient Medications  Medication Sig Dispense Refill  . chlorhexidine (PERIDEX) 0.12 % solution RINSE 15 ML'S TWICE DAILY AFTER BREAKFAST/BEFORE BEDTIME FOLLOWING BRUSHING AND FLOSSING  0  . DULoxetine (CYMBALTA) 30 MG capsule Take 1 capsule (30 mg total) by  mouth daily. 90 capsule 3  . hydrochlorothiazide (MICROZIDE) 12.5 MG capsule Take 1 capsule (12.5 mg total) by mouth daily. 90 capsule 3  . ibuprofen (ADVIL,MOTRIN) 600 MG tablet TAKE 1 TABLET BY MOUTH EVERY 8 HOURS AS NEEDED. 30 tablet 0  .  omeprazole (PRILOSEC) 40 MG capsule Take 1 capsule (40 mg total) by mouth 2 (two) times daily. 60 capsule 2  . ondansetron (ZOFRAN) 4 MG tablet Take 1 tablet (4 mg total) by mouth every 8 (eight) hours as needed for nausea or vomiting. 20 tablet 0  . PROAIR HFA 108 (90 Base) MCG/ACT inhaler TAKE 2 PUFFS BY MOUTH EVERY 6 HOURS AS NEEDED FOR WHEEZE OR SHORTNESS OF BREATH 8.5 Inhaler 1  . tiotropium (SPIRIVA HANDIHALER) 18 MCG inhalation capsule Place 1 capsule (18 mcg total) into inhaler and inhale daily. 30 capsule 12   No current facility-administered medications for this visit.     Allergies No Known Allergies  Histories Past Medical History:  Diagnosis Date  . Asthma   . Black stools   . Bronchitis    11/2011  . COPD (chronic obstructive pulmonary disease) ([place])   . Difficulty sleeping   . Dizziness   . ETOH abuse   . Former smoker   . GERD (gastroesophageal reflux disease)   . Hypertension goal BP (blood pressure) < 140/90 12/21/2011  . Neurofibromatosis, type 1 ([place])    diagnosed 2013  . Neuromuscular disorder ([place]) 2014   neurofibrosis  . Shortness of breath    with exertion  . Vomiting blood    Past Surgical History:  Procedure Laterality Date  . ESOPHAGOGASTRODUODENOSCOPY (EGD) WITH PROPOFOL  05/14/2012   Procedure: ESOPHAGOGASTRODUODENOSCOPY (EGD) WITH PROPOFOL;  Surgeon: Arta Silence, MD;  Location: [facility];  Service: Endoscopy;  Laterality: N/A;  . none    . UPPER GASTROINTESTINAL ENDOSCOPY     Social History   Socioeconomic History  . Marital status: Married    Spouse name: James [name]  . Number of children: 3  . Years of education: 11th  . Highest education level: Not on file  Occupational History  . Occupation: disablity  Social Needs  . Financial resource strain: Not on file  . Food insecurity:    Worry: Not on file    Inability: Not on file  . Transportation needs:    Medical: Not on file    Non-medical: Not on file  Tobacco Use  . Smoking  status: Current Every Day Smoker    Packs/day: 0.50    Years: 15.00    Pack years: 7.50    Types: Cigarettes    Start date: 05/28/2002  . Smokeless tobacco: Never Used  . Tobacco comment: tobacco infor given 04/01/18  Substance and Sexual Activity  . Alcohol use: Yes    Comment: drinks about 1 pint of liquor per day   . Drug use: No  . Sexual activity: Not on file  Lifestyle  . Physical activity:    Days per week: Not on file    Minutes per session: Not on file  . Stress: Not on file  Relationships  . Social connections:    Talks on phone: Not on file    Gets together: Not on file    Attends religious service: Not on file    Active member of club or organization: Not on file    Attends meetings of clubs or organizations: Not on file    Relationship status: Not on file  . Intimate partner violence:  Fear of current or ex partner: Not on file    Emotionally abused: Not on file    Physically abused: Not on file    Forced sexual activity: Not on file  Other Topics Concern  . Not on file  Social History Narrative   Unemployed. Recent patient of Antonietta Jewel, MD of [place] transferred to our practice after being in jail and losing insurance.       Lives in his parents home with his wife and 2 kids. Completed some High School.    Best contact [phone] and ok to leave a message.       Quit smoking early 7/13. Quit ETOH abuse early 203 after being told he had an upset stomach lining.       Started back smoking 05/2012. 3 cigarettes daily      Caffeine use: very little   Family History  Problem Relation Age of Onset  . Diabetes Mother   . Hypertension Mother   . Hypertension Father   . Diabetes Father   . Prostate cancer Father   . Neurofibromatosis Brother        deceased  . Neurofibromatosis Daughter   . Neurofibromatosis Daughter   . Colon cancer Neg Hx   . Esophageal cancer Neg Hx   . Rectal cancer Neg Hx   . Inflammatory bowel disease Neg Hx   .  Liver disease Neg Hx   . Pancreatic cancer Neg Hx   . Stomach cancer Neg Hx    I have reviewed his medical, social, and family history in detail and updated the electronic medical record as necessary.    PHYSICAL EXAMINATION  BP (!) 130/100 (BP Location: Left Arm, Patient Position: Sitting, Cuff Size: Normal)   Pulse 88   Ht 5\' 8"  (1.727 m) Comment: height measured without shoes  Wt 161 lb 6 oz (73.2 kg)   BMI 24.54 kg/m  Wt Readings from Last 3 Encounters:  05/30/18 161 lb 6 oz (73.2 kg)  04/16/18 154 lb (69.9 kg)  04/01/18 154 lb (69.9 kg)  GEN: NAD, appears stated age, doesn't appear chronically ill, accompanied by wife as well as his daughter PSYCH: Cooperative EYE: Conjunctivae pink, sclerae anicteric ENT: MMM, without oral ulcers NECK: Supple CV: RR without R/Gs  RESP: CTAB posteriorly, without wheezing GI: NABS, soft, nontender/nondistended, without rebound or guarding, no hepatosplenomegaly appreciated  MSK/EXT: No lower extremity edema present SKIN: No jaundice, no spider angiomata NEURO:  Alert & Oriented x 3, no focal deficits, no evidence of asterixis   REVIEW OF DATA  I reviewed the following data at the time of this encounter:  GI Procedures and Studies  November 2019 EGD - No gross lesions in esophagus. Biopsied. - Z-line irregular, 39 cm from the incisors. - Small hiatal hernia. - Erythematous mucosa in the antrum. - No gross lesions in the stomach. Biopsied. - No gross lesions in the duodenal bulb, in the first portion of the duodenum and in the second portion of the duodenum. Biopsied. Biopsies Diagnosis 1. Surgical [P], random gastric - MILD CHRONIC GASTRITIS WITHOUT ACTIVITY - NO H. PYLORI OR INTESTINAL METAPLASIA IDENTIFIED - SEE COMMENT 2. Surgical [P], duodenum - PEPTIC DUODENITIS - NO DYSPLASIA OR MALIGNANCY IDENTIFIED 3. Surgical [P], random esophageal - BENIGN SQUAMOUS MUCOSA - NO INCREASED INTRAEPITHELIAL EOSINOPHILS - SEE  COMMENT  Laboratory Studies  Reviewed in epic  Imaging Studies  No new studies to review   ASSESSMENT  Mr. Rennels is a 43 y.o.  male Neurofibromatosis, COPD, Alcohol Abuse, Tobacco Use, GERD, HTN.  The patient is seen today for evaluation and management of:  1. Gastroesophageal reflux disease, esophagitis presence not specified   2. Alcohol use disorder, moderate, dependence ([place])   3. Abnormal LFTs   4. Dark stools    Patient is clinically and hemodynamically doing well.  He has done extremely well since his endoscopy and I think majority of his improvement is a result of his own and his family's desire of trying to cut back on his alcohol consumption.  There is no doubt that microscopically he has evidence of gastritis that was likely being exacerbated as result of his alcohol consumption but is hard liquor they seem to be behind him currently.  Ideally he has complete alcohol cessation but he is previously had difficulty with trying to get into detox facilities and I think just him cutting back right now has helped tremendously.  Discussed the plan to continue trying to decrease his alcohol intake over the course the next 3 to 6 months.  Ideally every month or 2 he decreases his alcoholic beverage on a per DM from 3 down to 2 then 2 down to 1 then 1 down to every other day and then trying to stop completely.  I think this will be safe for him in the setting of a patient who wants to continue to improve but is worried about withdrawal symptoms.  In regards to his dark stool I suspect that there is still some component of this is a result of gastritis and he remains on ibuprofen on an infrequent basis currently.  As he remains on PPI therapy and also is still drinking alcohol I think the PPI will be necessary to be maintained.  I like to keep it at current dosing until March and then in March we can hopefully decrease down to once daily dosing.  We will see how his symptoms of GERD as well as  abdominal issues portend at that point in time.  I like to repeat liver tests as well to ensure that they have continue to improve if they have not normalized completely will obtain additional imaging including a liver Doppler with ultrasound.  No plan for liver biopsy currently.  I commended the patient once again for what he has been able to do.  All patient questions were answered, to the best of my ability, and the patient agrees to the aforementioned plan of action with follow-up as indicated.   PLAN  Laboratories as outlined below Continue PPI 40 mg twice daily until March and then starting in March go down to once daily PPI taken in the morning noon Try and minimize nonsteroidal use as much as possible Based on liver tests will consider additional imaging of the liver Continue to work on alcohol cessation by slowly cutting back as outlined above Continue hepatitis A/hepatitis B vaccinations   Orders Placed This Encounter  Procedures  . CBC  . Hepatic function panel  . IBC panel  . Ferritin    New Prescriptions   No medications on file   Modified Medications   No medications on file    Planned Follow Up: No follow-ups on file.   Justice Britain, MD [facility] Office # [phone]

## 2018-05-30 NOTE — Patient Instructions (Signed)
Your provider has requested that you go to the basement level for lab work before leaving today. Press "B" on the elevator. The lab is located at the first door on the left as you exit the elevator.  You will be contacted by our office for a follow up appointment for late March early April.  Thank you for entrusting me with your care and choosing [facility].  Dr Rush Landmark

## 2018-06-02 ENCOUNTER — Encounter: Payer: Self-pay | Admitting: Gastroenterology

## 2018-06-04 ENCOUNTER — Ambulatory Visit (HOSPITAL_COMMUNITY)
Admission: RE | Admit: 2018-06-04 | Discharge: 2018-06-04 | Disposition: A | Discharge status: Home / Self Care | Payer: Medicaid Other | Source: Ambulatory Visit | Attending: Gastroenterology | Admitting: Gastroenterology

## 2018-06-04 DIAGNOSIS — R945 Abnormal results of liver function studies: Secondary | ICD-10-CM | POA: Diagnosis present

## 2018-06-04 DIAGNOSIS — R7989 Other specified abnormal findings of blood chemistry: Secondary | ICD-10-CM

## 2018-06-05 ENCOUNTER — Other Ambulatory Visit: Payer: Self-pay

## 2018-06-05 DIAGNOSIS — R945 Abnormal results of liver function studies: Secondary | ICD-10-CM

## 2018-06-05 DIAGNOSIS — R7989 Other specified abnormal findings of blood chemistry: Secondary | ICD-10-CM

## 2018-06-05 NOTE — Progress Notes (Signed)
[facility] Clinic Note - Initial Visit   Date: 06/06/18  James [name] MRN: [id number] DOB: 08/08/1975   Dear Dr Yisroel Ramming:  Thank you for your kind referral of James [name] for consultation of neurofibromatosis type I. Although his history is well known to you, please allow [place] to reiterate it for the purpose of our medical record. The patient was accompanied to the clinic by his wife and son who also provides collateral information.     History of Present Illness: James [name] is a 43 y.o. 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    He was diagnosed with NF1 after evaluation for chest pain led to MRI thoracic spine showing neurofibromas involving the T2 and III nerve roots on the left. He has chronic sharp pain related to do, as if being punched.  He takes takes ibuprofen almost daily which provides intermittent relief. He intermittent numbness/tingling of the hands.   Over the past several years, there has been no new change to his back pain.  He denies any new vision changes, numbness or tingling, or weakness.  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  He has not been able to establish care with dermatology ophthalmology locally due to being on Medicaid.  He was previously evaluated by Dr. Leta Baptist in 2014 for neurofibromatosis type I.  At that time, he had new onset headaches and retro-orbital eye pain.  MRI of the brain and orbit was normal, specifically no evidence of optic glioma.  Patient has strong family history of neurofibromatosis including his mother, brother (deceased) and 2 daughters. He has one son that has unaffected.   He was drinking a gallon liquor daily for 5 years, and cut back in January 2020 down to 2 beers daily.  He  does not work and has been on disability since 2015 because of falls related to NF1.   Out-side paper records, electronic medical record, and images have been reviewed where available and summarized as:  MRI lumbar spine 02/27/2017:  1. Stable appearance of the lumbar spine with no neurofibroma identified. 2. Disc degeneration with small central disc protrusion at L5-S1, closely approximating the descending S1 nerve roots without overt impingement. Finding is stable from previous. 3. Chronic expansile osseous lesion at the medial left iliac wing, grossly stable relative to previous exams, and consistent with a benign etiology.  MRI thoracic spine wwo contrast 04/27/2014:  No change from prior studies. Neurofibroma involving the left T2 and T3 nerve roots with chronic changes in the T2 and T3 vertebral bodies. There is kyphosis of this level which is stable.  Possible small neurofibroma involving the right C8 and T1 nerve roots. Followup recommended. Coronal T1 postcontrast fat suppressed images are suggested on the follow-up MRI.  MRI brain and orbid wwo contrast 10/09/2012:  Normal   Past Medical History:  Diagnosis Date  . Asthma   . Black stools   . Bronchitis    11/2011  . COPD (chronic obstructive pulmonary disease) ([place])   . Difficulty sleeping   . Dizziness   . ETOH abuse   . Former smoker   . GERD (gastroesophageal reflux disease)   . Hypertension goal BP (blood pressure) < 140/90 12/21/2011  . Neurofibromatosis, type 1 ([place])    diagnosed 2013  . Neuromuscular disorder ([place])  2014   neurofibrosis  . Shortness of breath    with exertion  . Vomiting blood     Past Surgical History:  Procedure Laterality Date  . ESOPHAGOGASTRODUODENOSCOPY (EGD) WITH PROPOFOL  05/14/2012   Procedure: ESOPHAGOGASTRODUODENOSCOPY (EGD) WITH PROPOFOL;  Surgeon: Arta Silence, MD;  Location: [facility];  Service: Endoscopy;  Laterality: N/A;  . none    . UPPER GASTROINTESTINAL ENDOSCOPY         Medications:  Outpatient Encounter Medications as of 06/06/2018  Medication Sig  . chlorhexidine (PERIDEX) 0.12 % solution RINSE 15 ML'S TWICE DAILY AFTER BREAKFAST/BEFORE BEDTIME FOLLOWING BRUSHING AND FLOSSING  . DULoxetine (CYMBALTA) 30 MG capsule Take 1 capsule (30 mg total) by mouth daily.  . hydrochlorothiazide (MICROZIDE) 12.5 MG capsule Take 1 capsule (12.5 mg total) by mouth daily.  Marland Kitchen ibuprofen (ADVIL,MOTRIN) 600 MG tablet TAKE 1 TABLET BY MOUTH EVERY 8 HOURS AS NEEDED.  Marland Kitchen omeprazole (PRILOSEC) 40 MG capsule Take 1 capsule (40 mg total) by mouth 2 (two) times daily.  . ondansetron (ZOFRAN) 4 MG tablet Take 1 tablet (4 mg total) by mouth every 8 (eight) hours as needed for nausea or vomiting. (Patient not taking: Reported on 06/06/2018)  . PROAIR HFA 108 (90 Base) MCG/ACT inhaler TAKE 2 PUFFS BY MOUTH EVERY 6 HOURS AS NEEDED FOR WHEEZE OR SHORTNESS OF BREATH  . tiotropium (SPIRIVA HANDIHALER) 18 MCG inhalation capsule Place 1 capsule (18 mcg total) into inhaler and inhale daily.   No facility-administered encounter medications on file as of 06/06/2018.      Allergies: No Known Allergies  Family History: Family History  Problem Relation Age of Onset  . Diabetes Mother   . Hypertension Mother   . Hypertension Father   . Diabetes Father   . Prostate cancer Father   . Neurofibromatosis Brother        deceased  . Neurofibromatosis Daughter   . Neurofibromatosis Daughter   . Colon cancer Neg Hx   . Esophageal cancer Neg Hx   . Rectal cancer Neg Hx   . Inflammatory bowel disease Neg Hx   . Liver disease Neg Hx   . Pancreatic cancer Neg Hx   . Stomach cancer Neg Hx     Social History: Social History   Tobacco Use  . Smoking status: Current Every Day Smoker    Packs/day: 0.50    Years: 15.00    Pack years: 7.50    Types: Cigarettes    Start date: 05/28/2002  . Smokeless tobacco: Never Used  . Tobacco comment: tobacco infor given 04/01/18  Substance Use Topics  .  Alcohol use: Yes    Comment: drinks about 1 pint of liquor per day   . Drug use: No   Social History   Social History Narrative   Unemployed. Recent patient of Antonietta Jewel, MD of [place] transferred to our practice after being in jail and losing insurance.       Lives in his parents home with his wife and 2 kids. Completed some High School.    Best contact [phone] and ok to leave a message.       Quit smoking early 7/13. Quit ETOH abuse early 203 after being told he had an upset stomach lining.       Started back smoking 05/2012. 3 cigarettes daily      Caffeine use: very little      Patient is left-handed. He lives with his wife and children in a one level  home. He does not regularly exercise.    Review of Systems:  CONSTITUTIONAL: No fevers, chills, night sweats, or weight loss.   EYES: No visual changes or eye pain ENT: No hearing changes.  No history of nose bleeds.   RESPIRATORY: No cough, wheezing and shortness of breath.   CARDIOVASCULAR: Negative for chest pain, and palpitations.   GI: Negative for abdominal discomfort, blood in stools or black stools.  No recent change in bowel habits.   GU:  No history of incontinence.   MUSCLOSKELETAL: No history of joint pain or swelling.  No myalgias.   SKIN: Negative for lesions, rash, and itching.   HEMATOLOGY/ONCOLOGY: Negative for prolonged bleeding, bruising easily, and swollen nodes.  No history of cancer.   ENDOCRINE: Negative for cold or heat intolerance, polydipsia or goiter.   PSYCH:  No depression or anxiety symptoms.   NEURO: As Above.   Vital Signs:  BP (!) 160/110   Pulse 84   Ht 5\' 8"  (1.727 m)   Wt 162 lb (73.5 kg)   SpO2 98%   BMI 24.63 kg/m    General Medical Exam:   General:  Well appearing, comfortable.   Eyes/ENT: see cranial nerve examination.   Neck: No masses appreciated.  Full range of motion without tenderness.  No carotid bruits. Respiratory:  Clear to auscultation, good air  entry bilaterally.   Cardiac:  Regular rate and rhythm, no murmur.   Extremities:  No deformities, edema, or skin discoloration.  Skin: Multiple caf au lait and cutaneous neurofibromas over the body, with the largest neurofibroma over the right chest wall.    Neurological Exam: MENTAL STATUS including orientation to time, place, person, recent and remote memory, attention span and concentration, language, and fund of knowledge is normal.  Speech is not dysarthric.  CRANIAL NERVES: II:  No visual field defects.  Unremarkable fundi.   III-IV-VI: Pupils equal round and reactive to light.  Normal conjugate, extra-ocular eye movements in all directions of gaze.  No nystagmus.  No ptosis.   V:  Normal facial sensation.    VII:  Normal facial symmetry and movements.  VIII:  Normal hearing and vestibular function.   IX-X:  Normal palatal movement.   XI:  Normal shoulder shrug and head rotation.   XII:  Normal tongue strength and range of motion, no deviation or fasciculation.  MOTOR:  No atrophy, fasciculations or abnormal movements.  No pronator drift.  Tone is normal.    Right Upper Extremity:    Left Upper Extremity:    Deltoid  5/5   Deltoid  5/5   Biceps  5/5   Biceps  5/5   Triceps  5/5   Triceps  5/5   Wrist extensors  5/5   Wrist extensors  5/5   Wrist flexors  5/5   Wrist flexors  5/5   Finger extensors  5/5   Finger extensors  5/5   Finger flexors  5/5   Finger flexors  5/5   Dorsal interossei  5/5   Dorsal interossei  5/5   Abductor pollicis  5/5   Abductor pollicis  5/5   Tone (Ashworth scale)  0  Tone (Ashworth scale)  0   Right Lower Extremity:    Left Lower Extremity:    Hip flexors  5/5   Hip flexors  5/5   Hip extensors  5/5   Hip extensors  5/5   Knee flexors  5/5   Knee flexors  5/5  Knee extensors  5/5   Knee extensors  5/5   Dorsiflexors  5/5   Dorsiflexors  5/5   Plantarflexors  5/5   Plantarflexors  5/5   Toe extensors  5/5   Toe extensors  5/5   Toe flexors   5/5   Toe flexors  5/5   Tone (Ashworth scale)  0  Tone (Ashworth scale)  0   MSRs:  Right                                                                 Left brachioradialis 2+  brachioradialis 2+  biceps 2+  biceps 2+  triceps 2+  triceps 2+  patellar 2+  patellar 2+  ankle jerk 2+  ankle jerk 2+  Hoffman no  Hoffman no  plantar response down  plantar response down   SENSORY:  Normal and symmetric perception of light touch, pinprick, vibration, and proprioception.  Romberg's sign absent.   COORDINATION/GAIT: Normal finger-to- nose-finger and heel-to-shin.  Intact rapid alternating movements bilaterally.   Gait is mildly wide-based, unassisted and stable.  He is unsteady with stressed and tandem gait.    IMPRESSION: Neurofibromatosis type I with cutaneous neurofibromas, caf au lait spots, neurofibromas involving the left T2 and T3 nerve roots.  He does not describe any new neurological symptoms.  Repeat imaging of the thoracic spine will be ordered to assess stability of known neurofibromas.  I recommend that he establish with ophthalmology for annual eye exam as well as dermatology for neurofibromas.  He does have elevated blood pressure today, which was rechecked. Encouraged to follow-up with PCP regarding hypertension management and appropriate work-up as this population is at risk for renal artery stenosis and pheochromocytoma (rare)  - MRI thoracic spine wwo contrast to assess stability of known thoracic left T2 and T3 nerve roots  - Formal ophthalmologic examination, including visual screening   - Dermatology referral for neurofibromas (painful right chest wall neurofibroma)  - Follow-up with PCP for hypertension  Alcohol abuse, consuming up to 1 gallon of liquor daily for the past 5 years.    - Strongly encouraged him to abstain from alcohol, especially as it would predispose him to neuropathy.  - Check vitamin B12, folate, vitamin B1 for paresthesias at risk for vitamin  deficiencies  Return to clinic in 1 year or sooner   Thank you for allowing me to participate in patient's care.  If I can answer any additional questions, I would be pleased to do so.    Sincerely,    Arline Ketter K. Posey Pronto, DO

## 2018-06-06 ENCOUNTER — Ambulatory Visit: Payer: Medicaid Other | Admitting: Neurology

## 2018-06-06 ENCOUNTER — Encounter: Payer: Self-pay | Admitting: Neurology

## 2018-06-06 ENCOUNTER — Ambulatory Visit (INDEPENDENT_AMBULATORY_CARE_PROVIDER_SITE_OTHER): Payer: Medicaid Other | Admitting: Family Medicine

## 2018-06-06 ENCOUNTER — Other Ambulatory Visit (INDEPENDENT_AMBULATORY_CARE_PROVIDER_SITE_OTHER): Payer: Medicaid Other

## 2018-06-06 ENCOUNTER — Other Ambulatory Visit: Payer: Self-pay

## 2018-06-06 VITALS — BP 160/110 | HR 84 | Ht 68.0 in | Wt 162.0 lb

## 2018-06-06 VITALS — BP 142/100 | HR 86 | Temp 98.2°F | Wt 163.2 lb

## 2018-06-06 DIAGNOSIS — R202 Paresthesia of skin: Secondary | ICD-10-CM | POA: Diagnosis not present

## 2018-06-06 DIAGNOSIS — F1020 Alcohol dependence, uncomplicated: Secondary | ICD-10-CM

## 2018-06-06 DIAGNOSIS — Q8501 Neurofibromatosis, type 1: Secondary | ICD-10-CM

## 2018-06-06 DIAGNOSIS — F1010 Alcohol abuse, uncomplicated: Secondary | ICD-10-CM

## 2018-06-06 DIAGNOSIS — I10 Essential (primary) hypertension: Secondary | ICD-10-CM

## 2018-06-06 DIAGNOSIS — D216 Benign neoplasm of connective and other soft tissue of trunk, unspecified: Secondary | ICD-10-CM | POA: Diagnosis not present

## 2018-06-06 DIAGNOSIS — R6889 Other general symptoms and signs: Secondary | ICD-10-CM

## 2018-06-06 DIAGNOSIS — D3614 Benign neoplasm of peripheral nerves and autonomic nervous system of thorax: Secondary | ICD-10-CM

## 2018-06-06 LAB — FOLATE: FOLATE: 4.7 ng/mL — AB (ref >5.9)

## 2018-06-06 MED ORDER — AMLODIPINE BESYLATE 5 MG PO TABS: 5.0000 mg | ORAL_TABLET | Freq: Every day | ORAL | 0 refills | Status: DC

## 2018-06-06 NOTE — Patient Instructions (Signed)
It was a pleasure seeing you today.   Today we discussed your blood pressure  For your blood pressure: I have added a medication, amlodipine 5mg . Take this daily along with your hydrochlorothiazide. Please come in in 1 week for a nursing BP check.  Follow up with Dr. Ky Barban on 1/23.   I have provided resources for outpatient and inpatient rehab programs for alcohol. Please follow up with Dr. Ky Barban if you need further assistance.   Please follow up in 1 week or sooner if symptoms persist or worsen. Please call the clinic immediately if you have any concerns.   Our clinic's number is [phone]. Please call with questions or concerns.   Please go to the emergency room if chest pain or shortness of breathy  Thank you,  Caroline More, DO

## 2018-06-06 NOTE — Patient Instructions (Addendum)
MRI thoracic spine wwo contrast  Check labs  Referral to [facility]ology  Stop smoking smoking and alcohol use  Please follow-up with your primary care doctor for high blood pressure   Return to clinic in 1 year

## 2018-06-06 NOTE — Progress Notes (Signed)
Subjective:    Patient ID: James [name], male    DOB: [birth year], 43 y.o.   MRN: [id number]   CC:  HPI: Hypertension: - Medications: HCTZ 25mg  daily  - Compliance: good - Checking BP at home: sometimes checks at local drugstore. Has noted elevated pressures for over 1 month. States that systolic BP is usually in 150s. States that he was seen in neurology office earlier today and had BP of 168/112. Was told he needs to see PCP ASAP.  - Denies any SOB, CP, vision changes, LE edema, medication SEs, or symptoms of hypotension. No headaches.  - Diet: poor but working on improving. Per wife he eats lots of salt and butter. Wife states that other people in the home do not eat as unhealthy as he does.  - Exercise: unable to perform lots of activity due to spine problems. Has 3 kids and has to run behind them in house. Wife wants patient to start daily walks around the block.   Alcohol use Recently stopped drinking hard liquor. States he used to drink >1 gallon a day but quit cold [place] a few weeks ago. Began feeling "the shakes" and started drinking 2-3 cans of beer a day. Interested in quitting and starting a rehab program. Has previously been referred to [facility] but did not like the people there.   Objective:  BP (!) 142/100   Pulse 86   Temp 98.2 F (36.8 C) (Oral)   Wt 163 lb 3.2 oz (74 kg)   SpO2 99%   BMI 24.81 kg/m  Vitals and nursing note reviewed  General: well nourished, in no acute distress HEENT: normocephalic, PERRL, no nasal discharge, moist mucous membranes, good dentition without erythema or discharge noted in posterior oropharynx Neck: supple, non-tender, without lymphadenopathy Cardiac: RRR, clear S1 and S2, no murmurs, rubs, or gallops Respiratory: clear to auscultation bilaterally, no increased work of breathing Abdomen: soft, nontender, nondistended, no masses or organomegaly. Bowel sounds present Extremities: no edema or cyanosis. Warm, well perfused.    Skin: warm and dry, no rashes noted Neuro: alert and oriented, no focal deficits   Assessment & Plan:    Hypertension Patient with uncontrolled hypertension. Per chart review patient has had elevated blood pressures since November 2019.  Most recent blood pressure at neurology office was 168/112.  Patient also with elevated blood pressure readings at home when he measures them at local drugstore with systolics in the 253G.  Without any symptoms at the moment concerning for organ damage such as vision changes, headaches, edema.  No shortness of breath or chest pain either.  Last appointment for hypertension December 2017.  Patient states he was not aware that he needed appointment for hypertension management.  Advised patient for regular follow-up with PCP.  Will begin treatment with amlodipine 5 mg.  Can always increase dose as needed.  Patient to follow-up in 1 week for nursing blood pressure check and in 1 month with PCP, already has appointment scheduled with PCP at the end of the month.  Patient agreeable with this plan. -Continue hydrochlorothiazide 25 mg -Start amlodipine 5 mg, can increase if uncontrolled at nursing blood pressure check -Nursing blood pressure check in 1 week -Follow-up with PCP in 1 month for hypertension management -We will obtain BMP as it appears patient has not had lab work in some time -Strict return precautions given.     Alcohol use disorder, moderate, dependence ([place]) Patient with alcohol use disorder. States he used to drink >  1 gallon of hard liquor a day but cut back to 2-3 cans of beer a day. Is interested in rehabilitation programs but states he tried [facility] and did not want to return there. List of inpatient and outpatient programs given to patient with AVS. Instructed patient to follow up with PCP as well.  -follow up PRN with PCP     Return in about 1 week (around 06/13/2018) for RN blood pressure check.   Caroline More, DO, PGY-2

## 2018-06-07 LAB — BASIC METABOLIC PANEL
BUN/Creatinine Ratio: 5 — ABNORMAL LOW (ref 9–20)
BUN: 5 mg/dL — ABNORMAL LOW (ref 6–24)
CO2: 24 mmol/L (ref 20–29)
Calcium: 9.4 mg/dL (ref 8.7–10.2)
Chloride: 96 mmol/L (ref 96–106)
Creatinine, Ser: 0.94 mg/dL (ref 0.76–1.27)
GFR calc Af Amer: 116 mL/min/{1.73_m2} (ref >59)
GFR, EST NON AFRICAN AMERICAN: 100 mL/min/{1.73_m2} (ref >59)
Glucose: 99 mg/dL (ref 65–99)
Potassium: 3.6 mmol/L (ref 3.5–5.2)
Sodium: 137 mmol/L (ref 134–144)

## 2018-06-07 LAB — VITAMIN B12: Vitamin B-12: 455 pg/mL (ref 200–1100)

## 2018-06-07 NOTE — Assessment & Plan Note (Signed)
Patient with alcohol use disorder. States he used to drink >1 gallon of hard liquor a day but cut back to 2-3 cans of beer a day. Is interested in rehabilitation programs but states he tried [facility] and did not want to return there. List of inpatient and outpatient programs given to patient with AVS. Instructed patient to follow up with PCP as well.  -follow up PRN with PCP

## 2018-06-07 NOTE — Assessment & Plan Note (Signed)
Patient with uncontrolled hypertension. Per chart review patient has had elevated blood pressures since November 2019.  Most recent blood pressure at neurology office was 168/112.  Patient also with elevated blood pressure readings at home when he measures them at local drugstore with systolics in the 628B.  Without any symptoms at the moment concerning for organ damage such as vision changes, headaches, edema.  No shortness of breath or chest pain either.  Last appointment for hypertension December 2017.  Patient states he was not aware that he needed appointment for hypertension management.  Advised patient for regular follow-up with PCP.  Will begin treatment with amlodipine 5 mg.  Can always increase dose as needed.  Patient to follow-up in 1 week for nursing blood pressure check and in 1 month with PCP, already has appointment scheduled with PCP at the end of the month.  Patient agreeable with this plan. -Continue hydrochlorothiazide 25 mg -Start amlodipine 5 mg, can increase if uncontrolled at nursing blood pressure check -Nursing blood pressure check in 1 week -Follow-up with PCP in 1 month for hypertension management -We will obtain BMP as it appears patient has not had lab work in some time -Strict return precautions given.

## 2018-06-09 ENCOUNTER — Telehealth: Payer: Self-pay

## 2018-06-09 LAB — VITAMIN B1: Vitamin B1 (Thiamine): <6 nmol/L — ABNORMAL LOW (ref 8–30)

## 2018-06-09 NOTE — Telephone Encounter (Signed)
Called and informed patient of negative results per Dr. Tammi Klippel.  Patient was very appreciative.  James [name], [place]

## 2018-06-10 ENCOUNTER — Telehealth: Payer: Self-pay | Admitting: *Deleted

## 2018-06-10 NOTE — Telephone Encounter (Signed)
-----   Message from James Berthold, DO sent at 06/09/2018  3:33 PM EST ----- Please inform patient that his vitamin B1 and folate is very low, which is caused by alcohol use.  Start folic acid 1mg  daily and vitamin B1 100mg  daily.  Also, please follow-up with [name] to be sure referral to [facility] dermatology and ophthalmology was sent. Thanks.

## 2018-06-10 NOTE — Telephone Encounter (Signed)
Patient given results and instructions.  Referrals sent to [facility].

## 2018-06-11 ENCOUNTER — Ambulatory Visit (INDEPENDENT_AMBULATORY_CARE_PROVIDER_SITE_OTHER): Payer: Medicaid Other | Admitting: *Deleted

## 2018-06-11 DIAGNOSIS — I10 Essential (primary) hypertension: Secondary | ICD-10-CM

## 2018-06-11 NOTE — Progress Notes (Signed)
Patient here today for BP check.  He was recently started on amlodipine 5mg  and continues HCTZ12,5mg .     BP today is 122/88.  Checked BP in Left arm with regular cuff.  Symptoms present: none.    Patient last took BP med 7:30am today.  Routed note to PCP.    Jazzlin Clements, Salome Spotted, CMA

## 2018-06-12 ENCOUNTER — Other Ambulatory Visit: Payer: Self-pay | Admitting: Family Medicine

## 2018-06-18 NOTE — Progress Notes (Signed)
  Subjective:   Patient ID: James [name]    DOB: 26-Sep-1975, 43 y.o. male   MRN: [id number]  James [name] is a 43 y.o. male with a history of HTN, emphysema, GERD, alcohol dependence, NF1 here for   Hypertension: - Medications: amlodipine 5mg , HCTZ 25mg  - Compliance: good - Checking BP at home: doesn't have BP cuff. Sometimes goes to James [name] or James [name] to check. 122/88 at BP check last week. - Denies any SOB, CP, LE edema, headaches, medication SEs, or symptoms of hypotension - endorsing some blurry vision. Sees optho in February. - sometimes will get lightheaded and dizzy with positional changes.  Alcohol dependence - Has self-weaned down to 3 small beers per day. Previously drank liquor every day. - having some shakes daily with hallucinations a few times per day. - quit liquor 3 months ago.  - also followed by GI for LFT abnormalities and gastritis  Chronic Back Pain - due to neurofibromas from NF-1, evidenced on MRI. Has repeat upcoming MRI with Neurology. - ibuprofen 600mg  BID works for a little while and then it stops - has not tried tylenol. - movement exacerbates pain - pain is all down back  Review of Systems:  Per HPI.  [place], medications and smoking status reviewed.  Objective:   BP 132/80   Pulse (!) 116   Temp 98.1 F (36.7 C) (Oral)   Ht 5\' 8"  (1.727 m)   Wt 162 lb 8 oz (73.7 kg)   SpO2 98%   BMI 24.71 kg/m  Vitals and nursing note reviewed.  General: well nourished, well developed, in no acute distress with non-toxic appearance CV: regular rate and rhythm without murmurs, rubs, or gallops, no lower extremity edema Lungs: clear to auscultation bilaterally with normal work of breathing Skin: warm, dry, no rashes or lesions Extremities: warm and well perfused, normal tone MSK: ROM grossly intact, strength intact, gait normal. Few palpable nodules underneath skin around R upper back. Some paravertebral hypertonicity without warm, boggy tissue. Neuro: Alert  and oriented, speech normal  Assessment & Plan:   Hypertension At goal today with normal BP check last week. Will keep current regimen. Advised patient pain and alcohol withdrawal can contribute to higher BP and to help control those as well. Recent BMP wnl. F/u in 3 months.  Alcohol dependence with uncomplicated withdrawal ([place]) Patient self-weaning successfully, now 3 months into weaning with some evidence of minor withdrawal. Patient desires to continue self-wean. Encouraged and congratulated on efforts. Reviewed emergency precautions with patient and wife and reasons to report to the hospital.   Backache Likely due to extensive neurofibromas. Advised to continue ibuprofen and add tylenol but to be cautious on the amount of ibuprofen given h/o gastritis. Would not be candidate for opioids due to chronic pain and alcohol use. Discussed referral to pain clinic today, however patient wants to try to manage his pain with ibuprofen/tylenol and heating pad, believe this is reasonable. Patient will call if pain still not well controlled.  Orders Placed This Encounter  Procedures  . Flu Vaccine QUAD 36+ mos IM   Meds ordered this encounter  Medications  . ibuprofen (ADVIL,MOTRIN) 600 MG tablet    Sig: Take 1 tablet (600 mg total) by mouth every 8 (eight) hours as needed.    Dispense:  60 tablet    Refill:  0    James Percy, DO PGY-2, [facility] 06/20/2018 11:54 AM

## 2018-06-19 ENCOUNTER — Other Ambulatory Visit: Payer: Self-pay

## 2018-06-19 ENCOUNTER — Ambulatory Visit: Payer: Medicaid Other | Admitting: Family Medicine

## 2018-06-19 ENCOUNTER — Encounter: Payer: Self-pay | Admitting: Family Medicine

## 2018-06-19 VITALS — BP 132/80 | HR 116 | Temp 98.1°F | Ht 68.0 in | Wt 162.5 lb

## 2018-06-19 DIAGNOSIS — F10230 Alcohol dependence with withdrawal, uncomplicated: Secondary | ICD-10-CM | POA: Diagnosis not present

## 2018-06-19 DIAGNOSIS — Z23 Encounter for immunization: Secondary | ICD-10-CM

## 2018-06-19 DIAGNOSIS — I10 Essential (primary) hypertension: Secondary | ICD-10-CM

## 2018-06-19 DIAGNOSIS — M546 Pain in thoracic spine: Secondary | ICD-10-CM

## 2018-06-19 MED ORDER — IBUPROFEN 600 MG PO TABS: 600.0000 mg | ORAL_TABLET | Freq: Three times a day (TID) | ORAL | 0 refills | Status: DC | PRN

## 2018-06-19 NOTE — Patient Instructions (Signed)
It was great to see you!  Our plans for today:  - No changes to your blood pressure medications today. - Try taking up to 650mg  of tylenol with 800mg  of ibuprofen for your back. You can also try a heating pad. If this is still not controlling your pain, let us know and we can try something different.  Take care and seek immediate care sooner if you develop any concerns.   Dr. [facility]

## 2018-06-20 ENCOUNTER — Encounter: Payer: Self-pay | Admitting: Family Medicine

## 2018-06-20 NOTE — Assessment & Plan Note (Signed)
At goal today with normal BP check last week. Will keep current regimen. Advised patient pain and alcohol withdrawal can contribute to higher BP and to help control those as well. Recent BMP wnl. F/u in 3 months.

## 2018-06-20 NOTE — Assessment & Plan Note (Addendum)
Patient self-weaning successfully, now 3 months into weaning with some evidence of minor withdrawal. Patient desires to continue self-wean. Encouraged and congratulated on efforts. Reviewed emergency precautions with patient and wife and reasons to report to the hospital.

## 2018-06-20 NOTE — Assessment & Plan Note (Signed)
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 Advised to continue ibuprofen and add tylenol but to be cautious on the amount of ibuprofen given h/o gastritis. Would not be candidate for opioids due to chronic pain and alcohol use. Discussed referral to pain clinic today, however patient wants to try to manage his pain with ibuprofen/tylenol and heating pad, believe this is reasonable. Patient will call if pain still not well controlled.

## 2018-07-02 ENCOUNTER — Other Ambulatory Visit: Payer: Self-pay | Admitting: Family Medicine

## 2018-07-02 DIAGNOSIS — K5900 Constipation, unspecified: Secondary | ICD-10-CM

## 2018-07-08 ENCOUNTER — Telehealth: Payer: Self-pay | Admitting: Neurology

## 2018-07-08 NOTE — Telephone Encounter (Signed)
I will call to schedule and let patient know appt time and date.

## 2018-07-08 NOTE — Telephone Encounter (Signed)
Patient hasnt heard anything about the MRI that was supposed to be set up. Please call him back at [phone]. Thanks!

## 2018-07-09 ENCOUNTER — Telehealth: Payer: Self-pay | Admitting: *Deleted

## 2018-07-09 NOTE — Telephone Encounter (Signed)
Patient notified that his MRI is set for Feb. 24.  Arrival time is at 4:15.

## 2018-07-12 ENCOUNTER — Other Ambulatory Visit: Payer: Self-pay | Admitting: Family Medicine

## 2018-07-21 ENCOUNTER — Ambulatory Visit (HOSPITAL_COMMUNITY): Payer: Medicaid Other

## 2018-07-25 ENCOUNTER — Telehealth: Payer: Self-pay | Admitting: Neurology

## 2018-07-25 DIAGNOSIS — F1010 Alcohol abuse, uncomplicated: Secondary | ICD-10-CM

## 2018-07-25 DIAGNOSIS — D216 Benign neoplasm of connective and other soft tissue of trunk, unspecified: Secondary | ICD-10-CM

## 2018-07-25 DIAGNOSIS — Q8501 Neurofibromatosis, type 1: Secondary | ICD-10-CM

## 2018-07-25 DIAGNOSIS — D3614 Benign neoplasm of peripheral nerves and autonomic nervous system of thorax: Secondary | ICD-10-CM

## 2018-07-25 DIAGNOSIS — R202 Paresthesia of skin: Secondary | ICD-10-CM

## 2018-07-25 NOTE — Telephone Encounter (Signed)
[facility] Radiology dept called in about patient's MRI tomorrow. He needs to have STAT labs done TODAY or she will have to reschedule the testing. Please get this ordered. Thanks!

## 2018-07-25 NOTE — Telephone Encounter (Signed)
Did they leave a number to call? We need to see what labs they need.

## 2018-07-25 NOTE — Telephone Encounter (Signed)
STAT Bun/Creatinine ordered.  Dr. Posey Pronto - FYI.

## 2018-07-25 NOTE — Telephone Encounter (Signed)
I'm so sorry it's for the Creatine lab work. I forgot to put that.

## 2018-07-26 ENCOUNTER — Ambulatory Visit (HOSPITAL_COMMUNITY): Payer: Medicaid Other

## 2018-08-04 ENCOUNTER — Ambulatory Visit (HOSPITAL_COMMUNITY)
Admission: RE | Admit: 2018-08-04 | Discharge: 2018-08-04 | Disposition: A | Discharge status: Home / Self Care | Payer: Medicaid Other | Source: Ambulatory Visit | Attending: Neurology | Admitting: Neurology

## 2018-08-04 DIAGNOSIS — Q8501 Neurofibromatosis, type 1: Secondary | ICD-10-CM | POA: Diagnosis not present

## 2018-08-04 DIAGNOSIS — D3614 Benign neoplasm of peripheral nerves and autonomic nervous system of thorax: Secondary | ICD-10-CM

## 2018-08-04 DIAGNOSIS — R202 Paresthesia of skin: Secondary | ICD-10-CM | POA: Diagnosis present

## 2018-08-04 DIAGNOSIS — D216 Benign neoplasm of connective and other soft tissue of trunk, unspecified: Secondary | ICD-10-CM | POA: Insufficient documentation

## 2018-08-04 LAB — CREATININE, SERUM
Creatinine, Ser: 0.95 mg/dL (ref 0.61–1.24)
GFR calc Af Amer: >60 mL/min (ref >60)
GFR calc non Af Amer: >60 mL/min (ref >60)

## 2018-08-04 MED ORDER — GADOBUTROL 1 MMOL/ML IV SOLN
7.0000 mL | Freq: Once | INTRAVENOUS | Status: AC | PRN
  Administered 2018-08-04: 7 mL via INTRAVENOUS

## 2018-08-05 ENCOUNTER — Telehealth: Payer: Self-pay | Admitting: *Deleted

## 2018-08-05 NOTE — Telephone Encounter (Signed)
Called patient and gave him the results.

## 2018-08-05 NOTE — Telephone Encounter (Signed)
-----   Message from Alda Berthold, DO sent at 08/05/2018  1:21 PM EDT ----- Please inform patient that his MRI shows is stable, no new findings or change in neurofibromas. Thanks.

## 2018-08-09 ENCOUNTER — Other Ambulatory Visit: Payer: Self-pay | Admitting: Family Medicine

## 2018-08-09 ENCOUNTER — Other Ambulatory Visit: Payer: Self-pay | Admitting: Gastroenterology

## 2018-09-08 ENCOUNTER — Other Ambulatory Visit: Payer: Self-pay | Admitting: Gastroenterology

## 2018-09-24 ENCOUNTER — Telehealth: Payer: Self-pay | Admitting: *Deleted

## 2018-09-24 NOTE — Telephone Encounter (Deleted)
Pregabalin not covered, see below:     Please let me know if you would like to pursue a PA. Christen Bame, CMA

## 2018-09-24 NOTE — Telephone Encounter (Signed)
Opened in error, please disregard.  Christen Bame, CMA

## 2018-10-02 ENCOUNTER — Encounter: Payer: Self-pay | Admitting: Gastroenterology

## 2018-10-04 ENCOUNTER — Other Ambulatory Visit: Payer: Self-pay | Admitting: Family Medicine

## 2018-10-06 ENCOUNTER — Other Ambulatory Visit: Payer: Self-pay | Admitting: Family Medicine

## 2019-01-09 IMAGING — CR DG CHEST 2V
2 series · 2 of 2 positions shown · non-contrast
Comparison: January 22, 2018

CLINICAL DATA: Cough

EXAM:
CHEST - 2 VIEW

[chest pa]
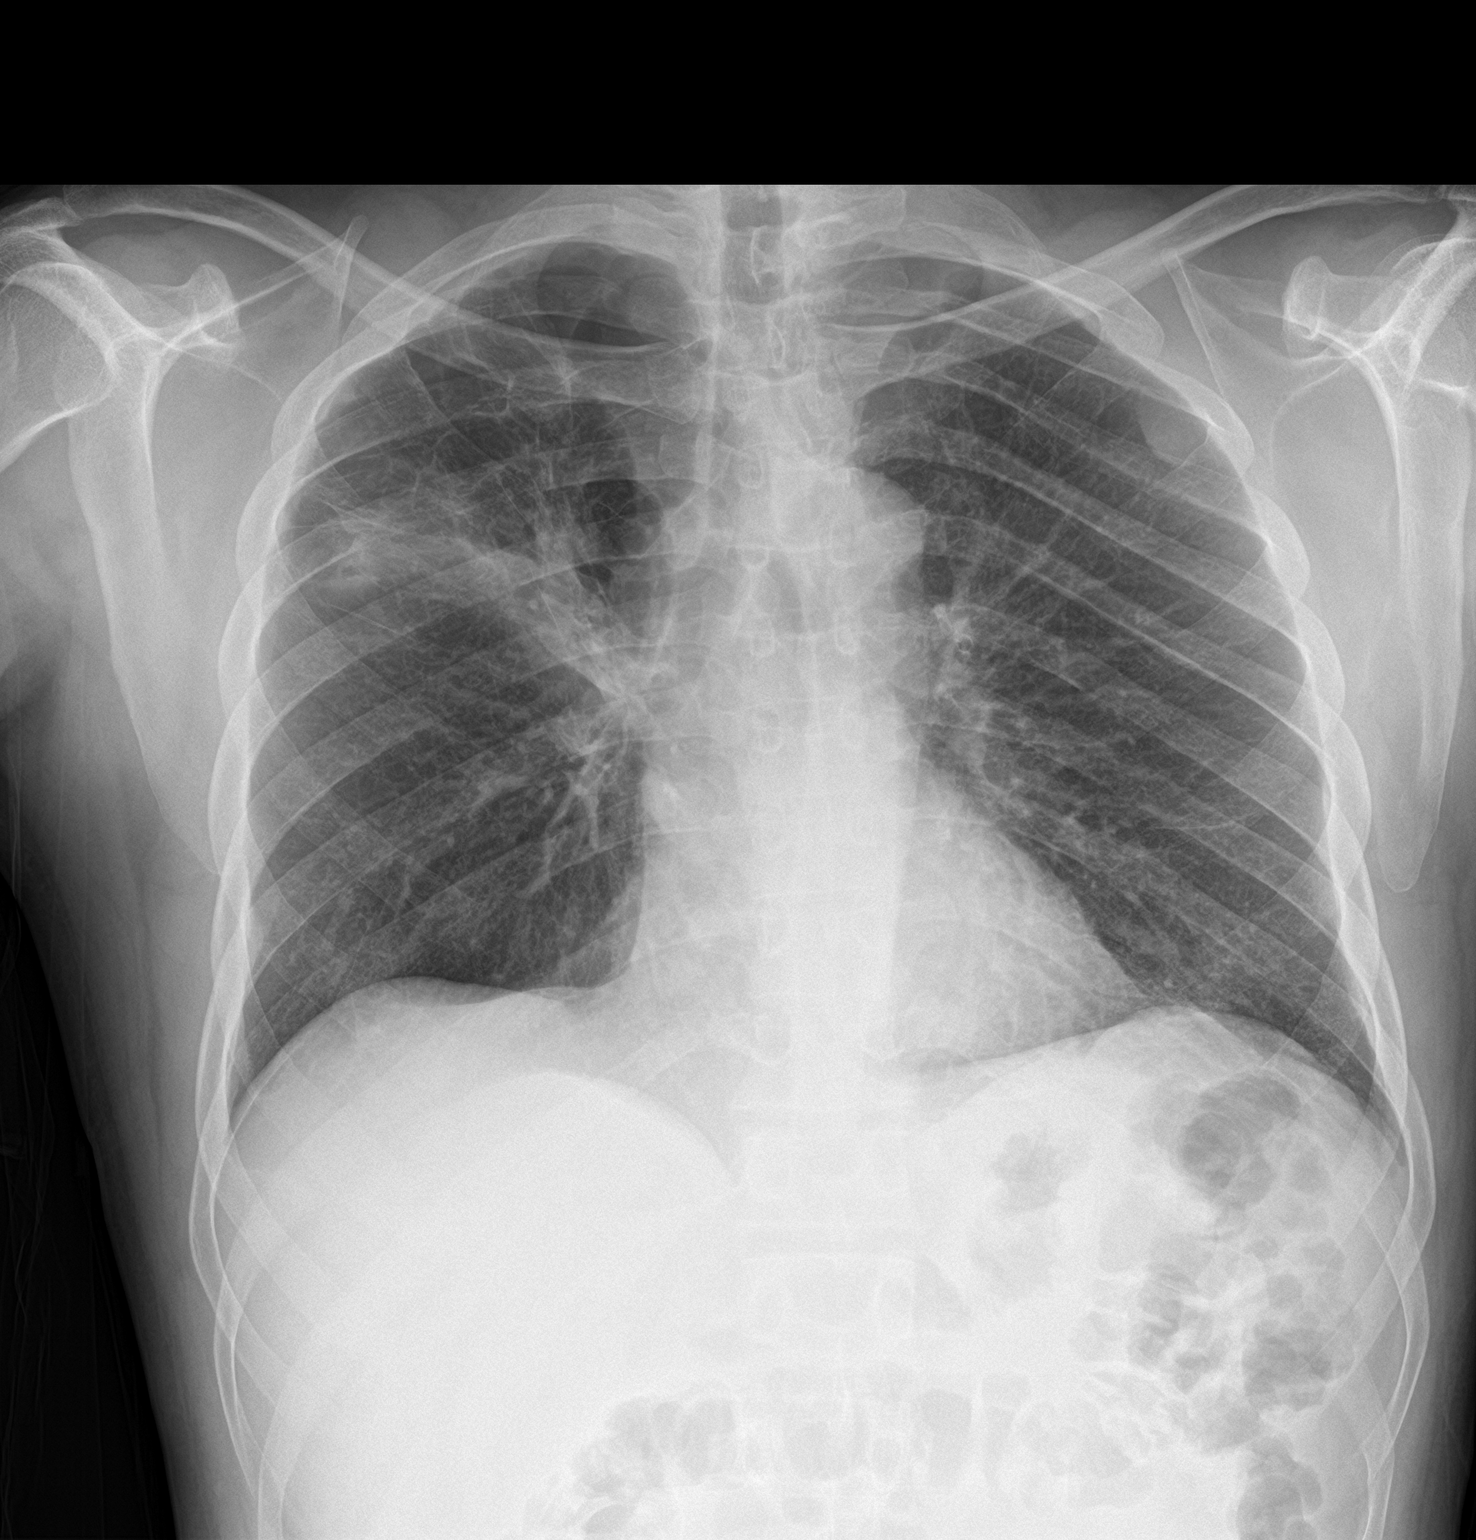

[chest lat]
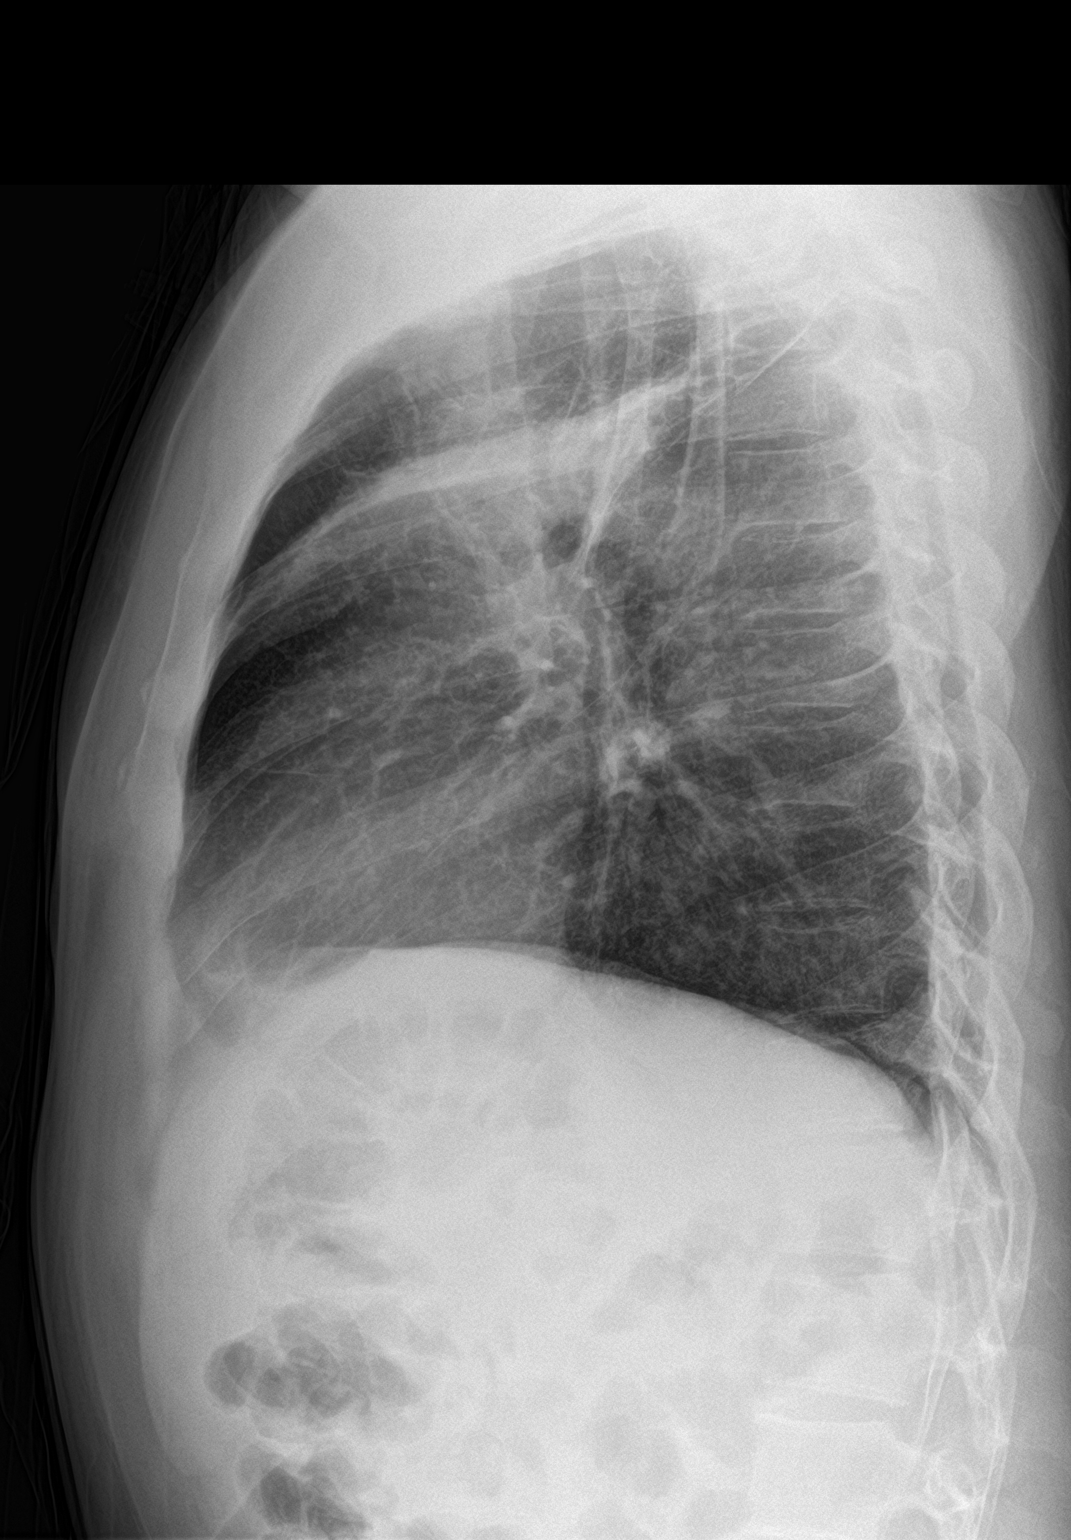

[2 of 2 positions shown; findings below may reference images not displayed]

FINDINGS: There is airspace consolidation in the anterior segment and axillary
subsegment regions of the right upper lobe. The lungs elsewhere are
clear. Heart size and pulmonary vascularity are normal. No
adenopathy. No bone lesions.
IMPRESSION: Areas of right upper lobe pneumonia. Lungs elsewhere clear. No
adenopathy evident.

## 2019-01-20 ENCOUNTER — Other Ambulatory Visit: Payer: Self-pay

## 2019-01-20 DIAGNOSIS — K5900 Constipation, unspecified: Secondary | ICD-10-CM

## 2019-01-21 MED ORDER — SENNOSIDES-DOCUSATE SODIUM 8.6-50 MG PO TABS: 1.0000 | ORAL_TABLET | Freq: Every day | ORAL | 0 refills | Status: DC

## 2019-03-17 ENCOUNTER — Other Ambulatory Visit: Payer: Self-pay | Admitting: Family Medicine

## 2019-05-07 ENCOUNTER — Other Ambulatory Visit: Payer: Self-pay

## 2019-05-07 DIAGNOSIS — Z20822 Contact with and (suspected) exposure to covid-19: Secondary | ICD-10-CM

## 2019-05-09 ENCOUNTER — Ambulatory Visit: Payer: Self-pay

## 2019-05-09 LAB — NOVEL CORONAVIRUS, NAA: SARS-CoV-2, NAA: DETECTED — AB

## 2019-05-09 NOTE — Telephone Encounter (Signed)
Pt given Covid-19 positive results. Discussed mild, moderate and severe symptoms. Advised pt to call 911 for any respiratory issues and/dehydration. Discussed non test criteria for ending self isolation. Pt advised of way to manage symptoms at home and review isolation precautions especially the importance of washing hands frequently and wearing a mask when around others. Pt verbalized understanding. Will report to HD. Advised pt to go to [facility] for occasional SOB with activity and at rest.  Reason for Disposition . Health Information question, no triage required and triager able to answer question  Answer Assessment - Initial Assessment Questions 1. REASON FOR CALL or QUESTION: "What is your reason for calling today?" or "How can I best help you?" or "What question do you have that I can help answer?"     Covid results  Protocols used: INFORMATION ONLY CALL-A-AH

## 2019-05-12 ENCOUNTER — Other Ambulatory Visit: Payer: Self-pay | Admitting: Family Medicine

## 2019-05-12 MED ORDER — SPIRIVA HANDIHALER 18 MCG IN CAPS: 18.0000 ug | ORAL_CAPSULE | Freq: Every day | RESPIRATORY_TRACT | 12 refills | Status: DC

## 2019-05-12 MED ORDER — ALBUTEROL SULFATE HFA 108 (90 BASE) MCG/ACT IN AERS: INHALATION_SPRAY | RESPIRATORY_TRACT | 1 refills | Status: DC

## 2019-05-12 NOTE — Progress Notes (Signed)
Spoke with patient after receiving positive COVID result.  Patient reports positive Covid contact with daughter and father.  Reports symptoms started 12/4 with runny nose, congestion, diarrhea, and occasional shortness of breath that is relieved with as needed albuterol.  He denies vomiting or fevers and he is tolerating oral hydration well.  Reports he has not been using his Spiriva as he thought this was discontinued previously.  Reports he needs refills for albuterol and Spiriva, refill sent to pharmacy.  Reviewed guidelines on ending self-isolation per CDC guidelines.  Reviewed reasons to present for emergent care including shortness of breath not responding to albuterol or inability to tolerate p.o. patient verbalized understanding.  Rory Percy, DO PGY-3, [facility] 05/12/2019 10:02 AM

## 2019-06-05 ENCOUNTER — Encounter: Payer: Self-pay | Admitting: Neurology

## 2019-06-08 ENCOUNTER — Other Ambulatory Visit: Payer: Self-pay

## 2019-06-08 ENCOUNTER — Telehealth (INDEPENDENT_AMBULATORY_CARE_PROVIDER_SITE_OTHER): Payer: Medicaid Other | Admitting: Neurology

## 2019-06-08 DIAGNOSIS — E538 Deficiency of other specified B group vitamins: Secondary | ICD-10-CM

## 2019-06-08 DIAGNOSIS — Q8501 Neurofibromatosis, type 1: Secondary | ICD-10-CM | POA: Diagnosis not present

## 2019-06-08 DIAGNOSIS — E519 Thiamine deficiency, unspecified: Secondary | ICD-10-CM

## 2019-06-08 DIAGNOSIS — F1010 Alcohol abuse, uncomplicated: Secondary | ICD-10-CM

## 2019-06-08 NOTE — Progress Notes (Signed)
   Due to the COVID-19 crisis, this telephone visit is done from my office and it was initiated and consent given by this patient and or family.   Telephone Visit The purpose of this telephone visit is to provide medical care while limiting exposure to the novel coronavirus.    Consent was obtained for virtual check in visit and initiated by pt/family:  Yes.   Answered questions that patient had about telehealth interaction:  Yes.   I discussed the limitations, risks, security and privacy concerns of performing an evaluation and management service by telephone. I also discussed with the patient that there may be a patient responsible charge related to this service. The patient expressed understanding and agreed to proceed.  Pt location: Home Physician Location: office Name of referring provider:  Rory Percy, DO I connected with .[facility] [place] at patients initiation/request on 06/08/2019 at 10:30 AM EST by telephone and verified that I am speaking with the correct person using two identifiers.  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  At his last visit, he disclosed that he was drinking 1 gallon of liquor nightly and I urged him to try to cut back.  He has been able to reduce consumption to a pint every other day and states that when he tried to stop completely, he would get the shakes and break out in sweats.  His labs indicated that both vitamin B1 and folate was very low.  He has not been on supplements.  Regarding his NF, there does not seem to be any progressive neurological of cutaneous symptoms.  He denies vision changes, and is overdue for eye evaluation.  MRI thoracic spine performed in March 2020 showed stable neurofibromas in the upper thoracic spine, no new findings.    Assessment and Plan:   1.  Neurofibromatosis type 1 with cutaneous neurofibromas,  caf au lait spots, neurofibromas involving the left T2 and T3 nerve roots.   I have independently viewed his MRI thoracic spine from March which shows stable neurofibromas in the upper thoracic region, no new abnormalities or progressive findings.  No new neurological symptoms.   - Recommend annual eye exam, including visual screening  - Continue to monitor clinically  2.  Alcohol abuse, reduced intake from 1 gallon liquor daily to one pint every other day  - I encouraged him to continue to slowly reduce alcohol consumption  3. Folate deficiency due to #2  - Start folic acid 1mg   4.  Thiamine deficiency due to #2  - Start vitamin B1 100mg  daily  Follow Up Instructions:   I discussed the assessment and treatment plan with the patient. The patient was provided an opportunity to ask questions and all were answered. The patient agreed with the plan and demonstrated an understanding of the instructions.   The patient was advised to call back or seek an in-person evaluation if the symptoms worsen or if the condition fails to improve as anticipated.  Return to clinic in 1 year  Total Time spent in visit with the patient was:  22 min, of which 100% of the time was spent in counseling and/or coordinating care.   Pt understands and agrees with the plan of care outlined.     Alda Berthold, DO

## 2019-06-16 ENCOUNTER — Other Ambulatory Visit: Payer: Self-pay | Admitting: *Deleted

## 2019-06-17 MED ORDER — AMLODIPINE BESYLATE 5 MG PO TABS: 5.0000 mg | ORAL_TABLET | Freq: Every day | ORAL | 3 refills | Status: DC

## 2019-06-22 ENCOUNTER — Ambulatory Visit: Payer: Medicaid Other | Admitting: Family Medicine

## 2019-06-25 ENCOUNTER — Ambulatory Visit: Payer: Medicaid Other | Admitting: Family Medicine

## 2019-06-25 NOTE — Progress Notes (Deleted)
  Subjective:   Patient ID: James [name]    DOB: Aug 25, 1975, 44 y.o. male   MRN: [id number]  James [name] is a 44 y.o. male with a history of HTN, emphysema, GERD, alcohol dependence, NF1, tobacco use here for   Hypertension: - Medications: Amlodipine 5 mg, HCTZ 12.5mg  - Compliance: *** - Checking BP at home: *** - Denies any SOB, CP, vision changes, LE edema, medication SEs, or symptoms of hypotension - Diet: *** - Exercise: ***  Emphysema - Medications: albuterol PRN, Spiriva - Taking: *** - Common triggers: *** - ED visits/hospitalization in the last 6 months: None, recent Covid infection 05/01/2019 - Current symptoms: *** - tobacco use: ***  ***thiamine? ***eye exam  Review of Systems:  Per HPI.  Medications and smoking status reviewed.  Objective:   There were no vitals taken for this visit. Vitals and nursing note reviewed.  General: well nourished, well developed, in no acute distress with non-toxic appearance HEENT: normocephalic, atraumatic, moist mucous membranes Neck: supple, non-tender without lymphadenopathy CV: regular rate and rhythm without murmurs, rubs, or gallops, no lower extremity edema Lungs: clear to auscultation bilaterally with normal work of breathing Abdomen: soft, non-tender, non-distended, no masses or organomegaly palpable, normoactive bowel sounds Skin: warm, dry, no rashes or lesions Extremities: warm and well perfused, normal tone MSK: ROM grossly intact, gait normal Neuro: Alert and oriented, speech normal  Assessment & Plan:   No problem-specific Assessment & Plan notes found for this encounter.  No orders of the defined types were placed in this encounter.  No orders of the defined types were placed in this encounter.   Rory Percy, DO PGY-3, [facility] 06/25/2019 8:14 AM

## 2019-06-30 ENCOUNTER — Ambulatory Visit: Payer: Medicaid Other | Admitting: Family Medicine

## 2019-06-30 NOTE — Progress Notes (Deleted)
   CHIEF COMPLAINT / HPI:  Hypertension: - Medications: Norvasc 5 mg, HCTZ 12.5 mg - Compliance: *** - Checking BP at home: *** - Denies any SOB, CP, vision changes, LE edema, medication SEs, or symptoms of hypotension - Diet: *** - Exercise: ***  Emphysema - Medications: albuterol PRN, Spiriva - Taking: *** - Common triggers: *** - ED visits/hospitalization in the last 6 months: none.  Recent Covid infection with symptoms starting 12/4. - Current symptoms: ***  ***eye exam ***one pint eod ***tobacco use?   PERTINENT  PMH / PSH: HTN, emphysema, GERD, neurofibromatosis type I, alcohol dependence, tobacco use  OBJECTIVE: There were no vitals taken for this visit.   *** General: well nourished, well developed, in no acute distress with non-toxic appearance HEENT: normocephalic, atraumatic, moist mucous membranes Neck: supple, non-tender without lymphadenopathy CV: regular rate and rhythm without murmurs, rubs, or gallops, no lower extremity edema Lungs: clear to auscultation bilaterally with normal work of breathing Abdomen: soft, non-tender, non-distended, no masses or organomegaly palpable, normoactive bowel sounds Skin: warm, dry, no rashes or lesions Extremities: warm and well perfused, normal tone MSK: ROM grossly intact, gait normal Neuro: Alert and oriented, speech normal  ASSESSMENT / PLAN:  No problem-specific Assessment & Plan notes found for this encounter.     Rory Percy, [place]

## 2019-07-02 ENCOUNTER — Ambulatory Visit (INDEPENDENT_AMBULATORY_CARE_PROVIDER_SITE_OTHER): Payer: Medicaid Other | Admitting: Family Medicine

## 2019-07-02 ENCOUNTER — Other Ambulatory Visit: Payer: Self-pay

## 2019-07-02 VITALS — BP 120/62 | HR 111 | Ht 66.0 in | Wt 153.2 lb

## 2019-07-02 DIAGNOSIS — Z23 Encounter for immunization: Secondary | ICD-10-CM

## 2019-07-02 DIAGNOSIS — Q8501 Neurofibromatosis, type 1: Secondary | ICD-10-CM

## 2019-07-02 DIAGNOSIS — F10230 Alcohol dependence with withdrawal, uncomplicated: Secondary | ICD-10-CM

## 2019-07-02 DIAGNOSIS — I10 Essential (primary) hypertension: Secondary | ICD-10-CM | POA: Diagnosis not present

## 2019-07-02 DIAGNOSIS — F17210 Nicotine dependence, cigarettes, uncomplicated: Secondary | ICD-10-CM | POA: Diagnosis not present

## 2019-07-02 DIAGNOSIS — J439 Emphysema, unspecified: Secondary | ICD-10-CM

## 2019-07-02 MED ORDER — FOLIC ACID 1 MG PO TABS: 1.0000 mg | ORAL_TABLET | Freq: Every day | ORAL | 3 refills | Status: DC

## 2019-07-02 MED ORDER — IBUPROFEN 600 MG PO TABS: 600.0000 mg | ORAL_TABLET | Freq: Three times a day (TID) | ORAL | 1 refills | Status: DC | PRN

## 2019-07-02 MED ORDER — THIAMINE HCL 100 MG PO TABS: 100.0000 mg | ORAL_TABLET | Freq: Every day | ORAL | 0 refills | Status: DC

## 2019-07-02 MED ORDER — STIOLTO RESPIMAT 2.5-2.5 MCG/ACT IN AERS: 2.0000 | INHALATION_SPRAY | Freq: Every day | RESPIRATORY_TRACT | 3 refills | Status: DC

## 2019-07-02 NOTE — Assessment & Plan Note (Signed)
Tobacco cessation counseling provided today. Actively cutting down, congratulated efforts.

## 2019-07-02 NOTE — Assessment & Plan Note (Signed)
Doing well with cutting down and recognizes withdrawal symptoms. Congratulated on efforts. Will check thiamine and folate levels today. Rx for supplementation sent to pharmacy.

## 2019-07-02 NOTE — Progress Notes (Signed)
   CHIEF COMPLAINT / HPI:  Hypertension: - Medications: Amlodipine 5 mg, HCTZ 12.5 mg - Compliance: good - Checking BP at home: highest 138 SBP, lowest 90s - Denies any SOB, CP, vision changes, LE edema, medication SEs - occasional lightheadedness with position changes  Emphysema - Medications: albuterol PRN, Spiriva - Taking: albuterol every night for wheezing - Common triggers: none identified, current tobacco use - ED visits/hospitalization in the last 6 months: None - Recent Covid infection 12/4, doing well. - Current symptoms: wheezing at night.  Alcohol dependence - actively cutting down, currently drinking 1 pint once per week (sips when feels withdrawal symptoms). Not currently taking folic acid, thiamine.  Smoking - 1/2ppd, actively cutting down.   PERTINENT  PMH / PSH: HTN, emphysema, GERD, neurofibromatosis type I, alcohol dependence, tobacco use  OBJECTIVE: BP 120/62   Pulse (!) 111   Ht 5\' 6"  (1.676 m)   Wt 153 lb 4 oz (69.5 kg)   SpO2 99%   BMI 24.74 kg/m   General: well nourished, well developed, in no acute distress with non-toxic appearance CV: regular rate and rhythm without murmurs, rubs, or gallops, no lower extremity edema Lungs: clear to auscultation bilaterally with normal work of breathing  ASSESSMENT / PLAN:  Hypertension Doing well on current regimen however with some lows and occasional orthostatic symptoms. Will d/c amlodipine and f/u in one month.   Emphysema lung ([place]) Remains symptomatic on spiriva with CAT 11 today with daily albuterol use. Will change spiriva to stiolto. Pharmacy demonstrated use today. F/u in one month.  Alcohol dependence with uncomplicated withdrawal ([place]) Doing well with cutting down and recognizes withdrawal symptoms. Congratulated on efforts. Will check thiamine and folate levels today. Rx for supplementation sent to pharmacy.   Cigarette smoker Tobacco cessation counseling provided today. Actively cutting down,  congratulated efforts.     Rory Percy, [place]

## 2019-07-02 NOTE — Progress Notes (Signed)
Asked by Dr. Ky Barban to provide inhaler education for this patient. Patient is being switched from Spiriva Handihaler to [employer]. He will continue to use albuterol prn.  Patient was counseled on the purpose, proper use, and adverse effects of Stiolto Respimat inhaler.  Patient verbalized understanding.  Reviewed appropriate use of maintenance vs rescue inhalers.  Stressed importance of using maintenance inhaler daily and rescue inhaler only as needed.  Patient verbalized understanding.  Demonstrated proper inhaler technique using Stiolto Respimat demo inhaler.  Patient able to demonstrate proper inhaler technique using teach back method. Provided patient with Stiolto Respimat patient education handout.  Thank you for involving pharmacy to assist in providing this patient's care.   Drexel Iha, PharmD PGY2 Ambulatory Care Pharmacy Resident

## 2019-07-02 NOTE — Assessment & Plan Note (Signed)
Remains symptomatic on spiriva with CAT 11 today with daily albuterol use. Will change spiriva to stiolto. Pharmacy demonstrated use today. F/u in one month.

## 2019-07-02 NOTE — Patient Instructions (Signed)
It was great to see you!  Our plans for today:  - Stop taking amlodipine. - Stop Spiriva. We are starting a new medication, Stiolto. Take 2 puffs once daily. - We are checking some labs today, we will release your results to MyChart. - Come back in 1 month for follow up.  Take care and seek immediate care sooner if you develop any concerns.   Dr. [facility]

## 2019-07-02 NOTE — Assessment & Plan Note (Signed)
Doing well on current regimen however with some lows and occasional orthostatic symptoms. Will d/c amlodipine and f/u in one month.

## 2019-07-06 ENCOUNTER — Telehealth: Payer: Self-pay

## 2019-07-06 NOTE — Telephone Encounter (Signed)
Pt calls nurse line requesting lab results. Informed patient of results documented. Also informed patient that he still had pending labs and that the office would contact him with the results once updated.   Talbot Grumbling, RN

## 2019-07-08 LAB — BASIC METABOLIC PANEL
BUN/Creatinine Ratio: 7 — ABNORMAL LOW (ref 9–20)
BUN: 5 mg/dL — ABNORMAL LOW (ref 6–24)
CO2: 23 mmol/L (ref 20–29)
Calcium: 10.4 mg/dL — ABNORMAL HIGH (ref 8.7–10.2)
Chloride: 98 mmol/L (ref 96–106)
Creatinine, Ser: 0.75 mg/dL — ABNORMAL LOW (ref 0.76–1.27)
GFR calc Af Amer: 131 mL/min/{1.73_m2} (ref >59)
GFR calc non Af Amer: 113 mL/min/{1.73_m2} (ref >59)
Glucose: 86 mg/dL (ref 65–99)
Potassium: 3.3 mmol/L — ABNORMAL LOW (ref 3.5–5.2)
Sodium: 143 mmol/L (ref 134–144)

## 2019-07-08 LAB — VITAMIN B1: Thiamine: 129.5 nmol/L (ref 66.5–200.0)

## 2019-07-08 LAB — FOLATE: Folate: 4.3 ng/mL (ref >3.0)

## 2019-09-25 ENCOUNTER — Other Ambulatory Visit: Payer: Self-pay | Admitting: Family Medicine

## 2019-09-26 ENCOUNTER — Other Ambulatory Visit: Payer: Self-pay | Admitting: Family Medicine

## 2019-09-29 NOTE — Telephone Encounter (Signed)
Appt made for 5/10. Salvatore Marvel, CMA

## 2019-10-05 ENCOUNTER — Other Ambulatory Visit: Payer: Self-pay

## 2019-10-05 ENCOUNTER — Ambulatory Visit (INDEPENDENT_AMBULATORY_CARE_PROVIDER_SITE_OTHER): Payer: Medicaid Other | Admitting: Family Medicine

## 2019-10-05 ENCOUNTER — Encounter: Payer: Self-pay | Admitting: Family Medicine

## 2019-10-05 VITALS — BP 100/56 | HR 102 | Ht 66.0 in | Wt 149.2 lb

## 2019-10-05 DIAGNOSIS — J439 Emphysema, unspecified: Secondary | ICD-10-CM | POA: Diagnosis not present

## 2019-10-05 DIAGNOSIS — F17200 Nicotine dependence, unspecified, uncomplicated: Secondary | ICD-10-CM

## 2019-10-05 DIAGNOSIS — F339 Major depressive disorder, recurrent, unspecified: Secondary | ICD-10-CM

## 2019-10-05 DIAGNOSIS — I10 Essential (primary) hypertension: Secondary | ICD-10-CM | POA: Diagnosis not present

## 2019-10-05 DIAGNOSIS — M79601 Pain in right arm: Secondary | ICD-10-CM

## 2019-10-05 DIAGNOSIS — F10230 Alcohol dependence with withdrawal, uncomplicated: Secondary | ICD-10-CM

## 2019-10-05 MED ORDER — BUPROPION HCL ER (XL) 150 MG PO TB24: 150.0000 mg | ORAL_TABLET | Freq: Every day | ORAL | 0 refills | Status: DC
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
## 2019-10-05 NOTE — Assessment & Plan Note (Addendum)
Currently 2 months in remission though will watch closely given recent stressors.

## 2019-10-05 NOTE — Patient Instructions (Signed)
It was great to see you!  Our plans for today:  - Keep taking ibuprofen for your arm pain and avoid activities that make it worse. It should get better over the next few weeks but can take about 4-6 weeks to heal. - We are stopping your hydrochlorothiazide. - We are starting buproprion for your depression. This can also help you to quit smoking. - I will call you once I hear from the pharmacist for your inhaler. - Come back in 2-4 weeks for follow up.  Take care and seek immediate care sooner if you develop any concerns.   Dr. [facility]

## 2019-10-05 NOTE — Addendum Note (Signed)
Addended by: Myles Gip on: 10/05/2019 12:14 PM   Modules accepted: Orders

## 2019-10-05 NOTE — Assessment & Plan Note (Signed)
Likely secondary to biceps tendinitis from frequent throwing ball with kids, positive speeds test.  No symptoms or findings to concern for infection, fracture, rotator cuff tear, capsulitis, dislocation.  Recommend continued NSAIDs and avoidance of exacerbating activities.  Likely will take 4-6 weeks to completely resolve.

## 2019-10-05 NOTE — Assessment & Plan Note (Signed)
PHQ 7 today, exacerbated by recent stressor.  Will add Wellbutrin today.  Advised to restart counseling though patient declines at this time.  Will reassess at follow-up in 2-4 weeks.

## 2019-10-05 NOTE — Assessment & Plan Note (Addendum)
Continued symptoms despite daily LAMA/LABA and almost daily albuterol use. Previous normal PFTs but with emphysematous changes seen since, may require ICS. Likely needs updated PFTs, will refer to pulmonology.

## 2019-10-05 NOTE — Assessment & Plan Note (Signed)
Actively trying to cut down though has not made much progress since last follow-up 3 months ago.  Will add Wellbutrin today.  Cessation counseling provided.

## 2019-10-05 NOTE — Assessment & Plan Note (Signed)
Hypotensive today with some orthostatic symptoms, will DC HCTZ today.  Follow-up in 2-4 weeks.

## 2019-10-19 ENCOUNTER — Telehealth: Payer: Self-pay

## 2019-10-19 ENCOUNTER — Encounter: Payer: Self-pay | Admitting: Family Medicine

## 2019-10-19 ENCOUNTER — Other Ambulatory Visit: Payer: Self-pay

## 2019-10-19 ENCOUNTER — Ambulatory Visit (INDEPENDENT_AMBULATORY_CARE_PROVIDER_SITE_OTHER): Payer: Medicaid Other | Admitting: Family Medicine

## 2019-10-19 DIAGNOSIS — I10 Essential (primary) hypertension: Secondary | ICD-10-CM | POA: Diagnosis not present

## 2019-10-19 NOTE — Patient Instructions (Addendum)
Thank you for coming to see me today. It was a pleasure! Today we talked about:   The referral has been placed for lumbar pulmonology.  You may contact them at [phone] to schedule an appointment regarding your lung condition.  Also for your reference, their address is [address], [place],  [address].  Restart your HCTZ at 12.5mg  daily.   Please follow-up with your primary care provider as needed.  If you have any questions or concerns, please do not hesitate to call the office at [phone].  Take Care,   Martinique Gennaro Lizotte, DO

## 2019-10-19 NOTE — Telephone Encounter (Signed)
Patient calls nurse line stating he was recently taken off HCTZ on 5/10. Patient reports abnormal blood pressures since then. Patient reports the following blood pressures:   05/12: 139/90 5/13: 135/94 5/23: 154/103  Patient requesting evaluation to restart BP medications.

## 2019-10-19 NOTE — Progress Notes (Signed)
   SUBJECTIVE:   CHIEF COMPLAINT / HPI:   Hypertension: - Medications: Patient previously on HCTZ 12.5 but this was stopped due to orthostatic symptoms.  However patient reports that after he stopped the medication his blood pressure was elevated at home to 150s over 100s.  He then started taking his HCTZ 12.5 again and is here to be sure that this is okay as he wanted to do what Dr. Ky Barban previously recommended for him. - Checking BP at home: as above - Denies any SOB, CP, vision changes, LE edema, medication SEs, or symptoms of hypotension  PERTINENT  PMH / PSH: HTN, emphysema, clinical von Recklinghausen's disease  OBJECTIVE:  BP 132/72   Pulse 97   Ht 5\' 6"  (1.676 m)   Wt 146 lb 6 oz (66.4 kg)   SpO2 99%   BMI 23.63 kg/m   General: NAD, pleasant Neck: Supple, no LAD Cardiovascular: RRR, no LE edema Respiratory: normal work of breathing Psych: AOx3, appropriate affect  ASSESSMENT/PLAN:   Hypertension Patient to continue his HCTZ 12.5 mg as his blood pressure was elevated at home.  Also referred him for 24-hour blood pressure monitoring through pharmacy program as he reports some orthostatic symptoms while on HCTZ.  Will discover if patient is experiencing different blood pressures at home versus here.  Patient reassured and BP here today at 132/72 is WNL.  Return precautions gust, patient with understanding.    James Jonnie Kubly, DO PGY-3, [facility]

## 2019-10-23 NOTE — Assessment & Plan Note (Signed)
Patient to continue his HCTZ 12.5 mg as his blood pressure was elevated at home.  Also referred him for 24-hour blood pressure monitoring through pharmacy program as he reports some orthostatic symptoms while on HCTZ.  Will discover if patient is experiencing different blood pressures at home versus here.  Patient reassured and BP here today at 132/72 is WNL.  Return precautions gust, patient with understanding.

## 2019-10-27 ENCOUNTER — Other Ambulatory Visit: Payer: Self-pay | Admitting: Family Medicine

## 2019-10-29 ENCOUNTER — Ambulatory Visit (INDEPENDENT_AMBULATORY_CARE_PROVIDER_SITE_OTHER): Payer: Medicaid Other | Admitting: Pharmacist

## 2019-10-29 ENCOUNTER — Other Ambulatory Visit: Payer: Self-pay

## 2019-10-29 VITALS — BP 138/102 | HR 96

## 2019-10-29 DIAGNOSIS — J439 Emphysema, unspecified: Secondary | ICD-10-CM

## 2019-10-29 DIAGNOSIS — I10 Essential (primary) hypertension: Secondary | ICD-10-CM | POA: Diagnosis not present

## 2019-10-29 MED ORDER — ALBUTEROL SULFATE HFA 108 (90 BASE) MCG/ACT IN AERS: INHALATION_SPRAY | RESPIRATORY_TRACT | 1 refills | Status: DC

## 2019-10-29 NOTE — Patient Instructions (Signed)
Wearing the Blood Pressure Monitor  The cuff will inflate every 20 minutes during the day and every 30 minutes while you sleep.  Your blood pressure readings will NOT display after cuff inflation  Fill out the blood pressure-activity diary during the day, especially during activities that may affect your reading -- such as exercise, stress, walking, taking your blood pressure medications  Important things to know:  Avoid taking the monitor off for the next 24 hours, unless it causes you discomfort or pain.  Do NOT get the monitor wet and do NOT dry to clean the monitor with any cleaning products.  Do NOT put the monitor on anyone else's arm.  When the cuff inflates, avoid excess movement. Let the cuffed arm hang loosely, slightly away from the body. Avoid flexing the muscles or moving the hand/fingers.  When you go to sleep, make sure that the hose is not kinked.  Remember to fill out the blood pressure activity diary.  If you experience severe pain or unusual pain (not associated with getting your blood pressure checked), remove the monitor.  Troubleshooting:  Code  Troubleshooting   1  Check cuff position, tighten cuff   2, 3  Remain still during reading   4, 87  Check air hose connections and make sure cuff is tight   85, 89  Check hose connections and make tubing is not crimped   86  Push START/STOP to restart reading   88, 91  Retry by pushing START/STOP   90  Replace batteries. If problem persists, remove monitor and bring back to   clinic at follow up   97, 98, 99  Service required - Remove monitor and bring back to clinic at follow up   Blood Pressure Activity Diary Time Lying down/ Sleeping Walking/ Exercise Stressed/ Angry Headache/ Pain Dizzy  9 AM       10 AM       11 AM       12 PM       1 PM       2 PM       Time Lying down/ Sleeping Walking/ Exercise Stressed/ Angry Headache/ Pain Dizzy  3 PM       4 PM        5 PM       6 PM       7 PM       8 PM        Time Lying down/ Sleeping Walking/ Exercise Stressed/ Angry Headache/ Pain Dizzy  9 PM       10 PM       11 PM       12 AM       1 AM       2 AM       3 AM       Time Lying down/ Sleeping Walking/ Exercise Stressed/ Angry Headache/ Pain Dizzy  4 AM       5 AM       6 AM       7 AM       8 AM       9 AM       10 AM        Time you woke up: _________                  Time you went to sleep:__________    Come back tomorrow at 8:30AM to have the   monitor removed  Call the Family Medicine Clinic if you have any questions before then ([phone])  

## 2019-10-29 NOTE — Progress Notes (Signed)
° °  S:    Patient arrives ambulating without assistance.    Presents to the clinic for hypertension evaluation, counseling, and management.  Patient was referred and last seen by Dr. Martinique Shirley on 10/19/2019. Patient reports he monitors BP daily. He restarted HCTZ "a couple weeks ago" in May 2021. Patient reports medication adherence to HCTZ  Current BP Medications include:  HCTZ 12.5 mg daily  Antihypertensives tried in the past include: amlodipine 5 mg daily ("dizzy")  ASCVD risk factors include: current smoker   O:  Physical Exam Constitutional:      Appearance: He is normal weight.  Pulmonary:     Effort: Pulmonary effort is normal.  Neurological:     Mental Status: He is alert.  Psychiatric:        Mood and Affect: Mood normal.        Thought Content: Thought content normal.        Judgment: Judgment normal.     Review of Systems  All other systems reviewed and are negative.   Home BP readings (self-reported): systolic [id number]   Ambulatory BP Monitor: Awake period average BP: 129/92 mmHg Asleep period average BP: 97/61 mmHg Asleep Dip: 99991111 systolic, 123456 diastolic  Last 3 Office BP readings: BP Readings from Last 3 Encounters:  10/29/19 (!) 138/102  10/19/19 132/72  10/05/19 (!) 100/56    BMET    Component Value Date/Time   NA 143 07/02/2019 1308   K 3.3 (L) 07/02/2019 1308   CL 98 07/02/2019 1308   CO2 23 07/02/2019 1308   GLUCOSE 86 07/02/2019 1308   GLUCOSE 103 (H) 04/01/2018 1430   BUN 5 (L) 07/02/2019 1308   CREATININE 0.75 (L) 07/02/2019 1308   CREATININE 0.90 05/03/2016 1657   CALCIUM 10.4 (H) 07/02/2019 1308   GFRNONAA 113 07/02/2019 1308   GFRNONAA >89 05/03/2016 1657   GFRAA 131 07/02/2019 1308   GFRAA >89 05/03/2016 1657    A/P: Longstanding hypertension slightly elevated on current medications. BP Goal = < 130 mmHg. Ambulatory BP monitor report shows elevated diastolic BP but controlled systolic BP during awake period. Patient's BP  is adequately dipping during asleep period. No reports of orthostasis. Medication adherence good.  -Continued hydrochlorothiazide 12.5mg  daily.  -Counseled on lifestyle modifications for blood pressure control including reduced dietary sodium, increased exercise, adequate sleep. - Repeat BMET / Potassium at next visit/lab draw.   Results reviewed and written information provided.   Total time in face-to-face counseling 30 minutes.  Patient seen with Esmeralda Links, PharmD Candidate and Richardine Service, PharmD PGY-1 Resident.

## 2019-10-30 NOTE — Progress Notes (Signed)
Reviewed: I agree with Dr. Koval's documentation and management. 

## 2019-10-30 NOTE — Assessment & Plan Note (Signed)
Longstanding hypertension slightly elevated on current medications. BP Goal = < 130 mmHg. Ambulatory BP monitor report shows elevated diastolic BP but controlled systolic BP during awake period. Patient's BP is adequately dipping during asleep period. No reports of orthostasis. Medication adherence good.  -Continued hydrochlorothiazide 12.5mg  daily.  -Counseled on lifestyle modifications for blood pressure control including reduced dietary sodium, increased exercise, adequate sleep. - Repeat BMET / Potassium at next visit/lab draw.

## 2019-11-09 ENCOUNTER — Telehealth: Payer: Self-pay

## 2019-11-09 ENCOUNTER — Other Ambulatory Visit: Payer: Self-pay | Admitting: Family Medicine

## 2019-11-09 MED ORDER — ALBUTEROL SULFATE HFA 108 (90 BASE) MCG/ACT IN AERS: 2.0000 | INHALATION_SPRAY | Freq: Four times a day (QID) | RESPIRATORY_TRACT | 2 refills | Status: DC | PRN

## 2019-11-09 NOTE — Telephone Encounter (Signed)
Received fax from [facility] requesting prior authorization of Albuterol.  Form placed in MD's box for completion. Or see below for alternatives.

## 2019-11-09 NOTE — Telephone Encounter (Signed)
Refill for brand sent per formulary.

## 2019-11-13 ENCOUNTER — Other Ambulatory Visit: Payer: Self-pay

## 2019-11-13 MED ORDER — IBUPROFEN 600 MG PO TABS: 600.0000 mg | ORAL_TABLET | Freq: Three times a day (TID) | ORAL | 1 refills | Status: DC | PRN

## 2019-11-13 MED ORDER — HYDROCHLOROTHIAZIDE 12.5 MG PO CAPS: 12.5000 mg | ORAL_CAPSULE | Freq: Every day | ORAL | 1 refills | Status: DC

## 2019-12-23 ENCOUNTER — Ambulatory Visit: Payer: Medicaid Other | Admitting: Family Medicine

## 2019-12-24 ENCOUNTER — Other Ambulatory Visit: Payer: Self-pay

## 2019-12-24 ENCOUNTER — Encounter: Payer: Self-pay | Admitting: Family Medicine

## 2019-12-24 ENCOUNTER — Ambulatory Visit (INDEPENDENT_AMBULATORY_CARE_PROVIDER_SITE_OTHER): Payer: Medicaid Other | Admitting: Family Medicine

## 2019-12-24 VITALS — BP 118/82 | HR 121 | Ht 66.0 in | Wt 146.8 lb

## 2019-12-24 DIAGNOSIS — I10 Essential (primary) hypertension: Secondary | ICD-10-CM

## 2019-12-24 DIAGNOSIS — F1020 Alcohol dependence, uncomplicated: Secondary | ICD-10-CM | POA: Diagnosis not present

## 2019-12-24 DIAGNOSIS — R000 Tachycardia, unspecified: Secondary | ICD-10-CM | POA: Diagnosis present

## 2019-12-24 DIAGNOSIS — E876 Hypokalemia: Secondary | ICD-10-CM

## 2019-12-24 DIAGNOSIS — Z23 Encounter for immunization: Secondary | ICD-10-CM

## 2019-12-24 DIAGNOSIS — F10230 Alcohol dependence with withdrawal, uncomplicated: Secondary | ICD-10-CM | POA: Diagnosis not present

## 2019-12-24 MED ORDER — NALTREXONE HCL 50 MG PO TABS: 50.0000 mg | ORAL_TABLET | Freq: Every day | ORAL | 3 refills | Status: DC

## 2019-12-24 NOTE — Patient Instructions (Signed)
It was good to see you today.  Thank you for coming in.  Your BP today was 118/82.  Great job on controlling your Blood Pressure.    I am prescribing you Naltrexone 50 mg once a day to help with alcohol withdrawal.  If that is too expensive give [place] a call and we can prescribe Gabapentin instead.   We are also obtaining blood labs today to check your Potassium and blood levels.  If you continue to have issues with sleep or other issues please come back and see [place].   Be Well, Dr Kathrin Ruddy

## 2019-12-24 NOTE — Progress Notes (Signed)
    SUBJECTIVE:   CHIEF COMPLAINT / HPI: Alcohol Cravings, COVID Shot  Patient indicates he has quit drinking as of 3 weeks ago.  Wants to remian sober and inquiring into what we can do to help him.  Currently having issues with cravings and having difficulty sleeping, restlessness, muscle spasms, and headaches since he quit drinking.  Indicates he was previously drinking "gallons" a day.  Indicates he has a good support sytem in his wife and family.  Wishes to remain sober for his 3 children at home.    Patient has a history of Hypertension.  Currently taking HCTZ 12.5 and possibly Amlodipine, though not currently being prescribed per patients chart.  Tolerating well.  No chest pain, difficulty breathing, leg swelling, or lightheadedness.    PERTINENT  PMH / PSH: Alcohol Use Disorder, Hypertension  OBJECTIVE:   BP 118/82   Pulse (!) 121   Ht 5\' 6"  (1.676 m)   Wt 66.6 kg   SpO2 97%   BMI 23.69 kg/m    On recheck of pulse, was 106  Physical Exam Constitutional:      General: He is not in acute distress.    Appearance: Normal appearance.  HENT:     Head: Normocephalic and atraumatic.  Cardiovascular:     Rate and Rhythm: Regular rhythm. Tachycardia present.     Pulses: Normal pulses.     Heart sounds: Normal heart sounds. No murmur heard.  No friction rub. No gallop.   Pulmonary:     Effort: Pulmonary effort is normal.     Breath sounds: Normal breath sounds.  Neurological:     Mental Status: He is alert.     ASSESSMENT/PLAN:   Hypertension Patient blood pressure well controlled today at 118/82. - Praised patient for good BP control - Continue taking HCTZ 12.5   Alcohol use disorder, moderate, dependence ([place]) Patient currently having alcohol cravings.  Has been 3 weeks sober. - Prescibe 50 mg Naltrexone qd - If too expensive, will prescribe Gabapentin instead  Hypokalemia Past history of K of 3.3.  Currently on HCTZ 12.5 - Obtain CBC, CMP - Will follow-up  with patient if K still below 3.5     Delora Fuel, MD [place]

## 2019-12-25 ENCOUNTER — Telehealth: Payer: Self-pay | Admitting: Family Medicine

## 2019-12-25 LAB — CBC
Hematocrit: 45.6 % (ref 37.5–51.0)
Hemoglobin: 15.5 g/dL (ref 13.0–17.7)
MCH: 30.1 pg (ref 26.6–33.0)
MCHC: 34 g/dL (ref 31.5–35.7)
MCV: 89 fL (ref 79–97)
Platelets: 290 10*3/uL (ref 150–450)
RBC: 5.15 x10E6/uL (ref 4.14–5.80)
RDW: 12.1 % (ref 11.6–15.4)
WBC: 8.4 10*3/uL (ref 3.4–10.8)

## 2019-12-25 LAB — COMPREHENSIVE METABOLIC PANEL
ALT: 29 IU/L (ref 0–44)
AST: 49 IU/L — ABNORMAL HIGH (ref 0–40)
Albumin/Globulin Ratio: 1.3 (ref 1.2–2.2)
Albumin: 4.2 g/dL (ref 4.0–5.0)
Alkaline Phosphatase: 152 IU/L — ABNORMAL HIGH (ref 48–121)
BUN/Creatinine Ratio: 5 — ABNORMAL LOW (ref 9–20)
BUN: 5 mg/dL — ABNORMAL LOW (ref 6–24)
Bilirubin Total: 1 mg/dL (ref 0.0–1.2)
CO2: 25 mmol/L (ref 20–29)
Calcium: 9.9 mg/dL (ref 8.7–10.2)
Chloride: 89 mmol/L — ABNORMAL LOW (ref 96–106)
Creatinine, Ser: 0.99 mg/dL (ref 0.76–1.27)
GFR calc Af Amer: 108 mL/min/{1.73_m2} (ref >59)
GFR calc non Af Amer: 94 mL/min/{1.73_m2} (ref >59)
Globulin, Total: 3.2 g/dL (ref 1.5–4.5)
Glucose: 94 mg/dL (ref 65–99)
Potassium: 3.1 mmol/L — ABNORMAL LOW (ref 3.5–5.2)
Sodium: 132 mmol/L — ABNORMAL LOW (ref 134–144)
Total Protein: 7.4 g/dL (ref 6.0–8.5)

## 2019-12-25 MED ORDER — POTASSIUM CHLORIDE ER 10 MEQ PO TBCR: 10.0000 meq | EXTENDED_RELEASE_TABLET | Freq: Every day | ORAL | 3 refills | Status: DC

## 2019-12-25 NOTE — Telephone Encounter (Cosign Needed)
Spoke with patient about Low Potassium. Had K of 3.1 on labs yesterday.  - Prescribed patient K-Dur 10 mEQ to be taken 2 times a day for first 2 days and then once daily after - Continue HCTZ at this time as BP is well controlled - F/u in 2-3 weeks for K check

## 2019-12-28 DIAGNOSIS — E876 Hypokalemia: Secondary | ICD-10-CM

## 2019-12-28 HISTORY — DX: Hypokalemia: E87.6

## 2019-12-28 NOTE — Assessment & Plan Note (Signed)
Patient currently having alcohol cravings.  Has been 3 weeks sober. - Prescibe 50 mg Naltrexone qd - If too expensive, will prescribe Gabapentin instead

## 2019-12-28 NOTE — Assessment & Plan Note (Signed)
Past history of K of 3.3.  Currently on HCTZ 12.5 - Obtain CBC, CMP - Will follow-up with patient if K still below 3.5

## 2019-12-28 NOTE — Assessment & Plan Note (Signed)
Patient blood pressure well controlled today at 118/82. - Praised patient for good BP control - Continue taking HCTZ 12.5

## 2020-01-14 ENCOUNTER — Other Ambulatory Visit: Payer: Self-pay

## 2020-01-14 ENCOUNTER — Ambulatory Visit (INDEPENDENT_AMBULATORY_CARE_PROVIDER_SITE_OTHER): Payer: Medicaid Other

## 2020-01-14 DIAGNOSIS — Z23 Encounter for immunization: Secondary | ICD-10-CM | POA: Diagnosis present

## 2020-01-15 NOTE — Progress Notes (Signed)
Patient presents to nurse clinic for second covid immunization. Patient answers no to all screening questions. Administered in right deltoid, site unremarkable. Patient tolerated injection well.     Covid-19 Vaccination Clinic  Name:  James [name]    MRN: [id number] DOB: 1975/09/19  01/14/2020  Mr. Memoli was observed post Covid-19 immunization for 15 minutes without incident. He was provided with Vaccine Information Sheet and instruction to access the V-Safe system.   Mr. Zirbes was instructed to call 911 with any severe reactions post vaccine: Marland Kitchen Difficulty breathing  . Swelling of face and throat  . A fast heartbeat  . A bad rash all over body  . Dizziness and weakness  Patient provided with updated immunization card and record.   Talbot Grumbling, RN

## 2020-02-04 ENCOUNTER — Other Ambulatory Visit: Payer: Self-pay | Admitting: *Deleted

## 2020-02-05 MED ORDER — IBUPROFEN 600 MG PO TABS: 600.0000 mg | ORAL_TABLET | Freq: Three times a day (TID) | ORAL | 1 refills | Status: DC | PRN

## 2020-03-11 ENCOUNTER — Other Ambulatory Visit: Payer: Self-pay | Admitting: Family Medicine

## 2020-04-16 ENCOUNTER — Other Ambulatory Visit: Payer: Self-pay | Admitting: Family Medicine

## 2020-05-18 ENCOUNTER — Other Ambulatory Visit: Payer: Self-pay

## 2020-05-19 ENCOUNTER — Other Ambulatory Visit: Payer: Self-pay | Admitting: *Deleted

## 2020-05-20 MED ORDER — BUPROPION HCL ER (XL) 150 MG PO TB24: 150.0000 mg | ORAL_TABLET | Freq: Every day | ORAL | 1 refills | Status: DC

## 2020-06-06 ENCOUNTER — Telehealth (INDEPENDENT_AMBULATORY_CARE_PROVIDER_SITE_OTHER): Payer: Medicaid Other | Admitting: Neurology

## 2020-06-06 ENCOUNTER — Other Ambulatory Visit: Payer: Self-pay

## 2020-06-06 DIAGNOSIS — Q8501 Neurofibromatosis, type 1: Secondary | ICD-10-CM

## 2020-06-06 NOTE — Progress Notes (Signed)
   Due to the COVID-19 crisis, this virtual visit is done from my office and it was initiated and consent given by this patient and or family.   Virtual Visit The purpose of this virtual visit is to provide medical care while limiting exposure to the novel coronavirus.    Consent was obtained for virtual check in visit and initiated by pt/family:  Yes.   Answered questions that patient had about telehealth interaction:  Yes.   I discussed the limitations, risks, security and privacy concerns of performing an evaluation and management service by telephone. I also discussed with the patient that there may be a patient responsible charge related to this service. The patient expressed understanding and agreed to proceed.  Pt location: Home Physician Location: office Name of referring provider:  Myles Gip, DO I connected with .James [name] Pair at patients initiation/request on 06/06/2020 at  2:30 PM EST by telephone and verified that I am speaking with the correct person using two identifiers.  Pt MRN:  [id number] Pt DOB:  1976-04-28   History of Present Illness: This is a 45 year-old man for follow-up of neurofibromatosis type 1. Patient was unable to connect via video so it was made into telephone visit.  He has been doing fairly well over the past year. He has been sober since the summer of 2021 and feels much better.  He has been compliant with taking folic acid and thiamine supplementation.  Lately, he complaints of tight sensation, pain, and spasms in his mid-back and lower back.  No weakness.   He has known neurofibroma in the upper thoracic cord.  Last imaging was in March 2020.   Assessment and Plan:   1.  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   He complaints of new back pain so will obtain MRI thoracic and lumbar spine wwo contrast to evaluate for new neurofibromas. Annual eye exam  recommended  2.  History of alcohol abuse, sober since summer 2021.  Praised patient for this and encouraged him to abstain.  Continue folic acid 1mg  and thiamine 100mg  daily x 6 months, then switch to multivitamin.   Follow Up Instructions:   I discussed the assessment and treatment plan with the patient. The patient was provided an opportunity to ask questions and all were answered. The patient agreed with the plan and demonstrated an understanding of the instructions.   The patient was advised to call back or seek an in-person evaluation if the symptoms worsen or if the condition fails to improve as anticipated.  Further recommendations pending results.   Total Time spent in visit with the patient was:  15 min, of which 100% of the time was spent in counseling and/or coordinating care.   Pt understands and agrees with the plan of care outlined.     James Berthold, DO

## 2020-06-07 NOTE — Addendum Note (Signed)
Addended by: Ulice Brilliant T on: 06/07/2020 08:34 AM   Modules accepted: Orders

## 2020-06-15 ENCOUNTER — Other Ambulatory Visit: Payer: Self-pay | Admitting: *Deleted

## 2020-06-17 MED ORDER — FOLIC ACID 1 MG PO TABS: 1.0000 mg | ORAL_TABLET | Freq: Every day | ORAL | 3 refills | Status: DC

## 2020-06-18 ENCOUNTER — Other Ambulatory Visit: Payer: Self-pay | Admitting: Family Medicine

## 2020-06-20 ENCOUNTER — Other Ambulatory Visit: Payer: Self-pay | Admitting: *Deleted

## 2020-06-20 MED ORDER — FOLIC ACID 1 MG PO TABS
1.0000 mg | ORAL_TABLET | Freq: Every day | ORAL | 3 refills | Status: DC
Start: 2020-06-20 — End: 2020-12-08

## 2020-06-24 ENCOUNTER — Ambulatory Visit
Admission: RE | Admit: 2020-06-24 | Discharge: 2020-06-24 | Disposition: A | Discharge status: Home / Self Care | Payer: Medicaid Other | Source: Ambulatory Visit | Attending: Neurology | Admitting: Neurology

## 2020-06-24 ENCOUNTER — Other Ambulatory Visit: Payer: Self-pay

## 2020-06-24 MED ORDER — GADOBENATE DIMEGLUMINE 529 MG/ML IV SOLN
15.0000 mL | Freq: Once | INTRAVENOUS | Status: AC | PRN
  Administered 2020-06-24: 15 mL via INTRAVENOUS

## 2020-06-27 ENCOUNTER — Telehealth: Payer: Self-pay

## 2020-06-27 DIAGNOSIS — R202 Paresthesia of skin: Secondary | ICD-10-CM

## 2020-06-27 DIAGNOSIS — G709 Myoneural disorder, unspecified: Secondary | ICD-10-CM

## 2020-06-27 DIAGNOSIS — D3614 Benign neoplasm of peripheral nerves and autonomic nervous system of thorax: Secondary | ICD-10-CM

## 2020-06-27 DIAGNOSIS — M549 Dorsalgia, unspecified: Secondary | ICD-10-CM

## 2020-06-27 NOTE — Telephone Encounter (Signed)
Called patient and informed him of results per Dr. Posey Pronto. Patient stated that he would like to try out Physical therapy. Informed patient that I would send a PT referral to [facility]. Patient aware and had no questions or concerns.

## 2020-06-27 NOTE — Telephone Encounter (Signed)
-----   Message from Donika K Patel, DO sent at 06/27/2020 10:33 AM EST ----- Please inform patient that his MRI shows stable neurofibromas, no new changes.  If he is still having back pain, we can refer him to physical therapy.  Please order, if he would like to start. Thanks.   

## 2020-06-27 NOTE — Telephone Encounter (Signed)
Called patient and left a message for a call back.  

## 2020-06-27 NOTE — Telephone Encounter (Signed)
-----   Message from Alda Berthold, DO sent at 06/27/2020 10:33 AM EST ----- Please inform patient that his MRI shows stable neurofibromas, no new changes.  If he is still having back pain, we can refer him to physical therapy.  Please order, if he would like to start. Thanks.

## 2020-06-28 ENCOUNTER — Other Ambulatory Visit: Payer: Self-pay

## 2020-06-28 DIAGNOSIS — G709 Myoneural disorder, unspecified: Secondary | ICD-10-CM

## 2020-06-28 DIAGNOSIS — M546 Pain in thoracic spine: Secondary | ICD-10-CM

## 2020-06-28 DIAGNOSIS — M549 Dorsalgia, unspecified: Secondary | ICD-10-CM

## 2020-07-02 ENCOUNTER — Other Ambulatory Visit: Payer: Self-pay | Admitting: Family Medicine

## 2020-07-18 ENCOUNTER — Other Ambulatory Visit: Payer: Self-pay

## 2020-07-18 ENCOUNTER — Ambulatory Visit (INDEPENDENT_AMBULATORY_CARE_PROVIDER_SITE_OTHER): Payer: Medicaid Other

## 2020-07-18 DIAGNOSIS — Z23 Encounter for immunization: Secondary | ICD-10-CM | POA: Diagnosis present

## 2020-07-18 MED ORDER — HYDROCHLOROTHIAZIDE 12.5 MG PO CAPS: 12.5000 mg | ORAL_CAPSULE | Freq: Every day | ORAL | 1 refills | Status: DC

## 2020-07-19 ENCOUNTER — Ambulatory Visit: Payer: Medicaid Other | Attending: Neurology | Admitting: Physical Therapy

## 2020-09-08 ENCOUNTER — Other Ambulatory Visit: Payer: Self-pay | Admitting: Family Medicine

## 2020-09-13 ENCOUNTER — Other Ambulatory Visit: Payer: Self-pay | Admitting: Family Medicine

## 2020-09-28 ENCOUNTER — Ambulatory Visit (INDEPENDENT_AMBULATORY_CARE_PROVIDER_SITE_OTHER): Payer: Medicaid Other | Admitting: Family Medicine

## 2020-09-28 ENCOUNTER — Encounter: Payer: Self-pay | Admitting: Family Medicine

## 2020-09-28 ENCOUNTER — Other Ambulatory Visit: Payer: Self-pay

## 2020-09-28 VITALS — BP 121/78 | HR 91 | Ht 66.0 in | Wt 176.4 lb

## 2020-09-28 DIAGNOSIS — G44229 Chronic tension-type headache, not intractable: Secondary | ICD-10-CM

## 2020-09-28 DIAGNOSIS — R053 Chronic cough: Secondary | ICD-10-CM | POA: Diagnosis present

## 2020-09-28 MED ORDER — STIOLTO RESPIMAT 2.5-2.5 MCG/ACT IN AERS: 2.0000 | INHALATION_SPRAY | Freq: Every day | RESPIRATORY_TRACT | 3 refills | Status: DC

## 2020-09-28 NOTE — Assessment & Plan Note (Signed)
Patient has imaging consistent with emphysema.  Does not appear to have formal diagnosis of COPD.  Was on daily LABA/LAMA but has not taken this in a long time.  Recent thoracic spine MRI did not suggest any lung pathology. - Chest x-ray - Restart daily controller inhaler.

## 2020-09-28 NOTE — Assessment & Plan Note (Signed)
Chronic daily headache for the past month that coincided with cough, although history is not convincing for cough induced headache given his neurofibromatosis type I we will get MRI of brain.  Last MRI brain in 2015.  Will get BMP given his NSAID use.  Advised him to switch to Aleve from the ibuprofen.

## 2020-09-28 NOTE — Progress Notes (Signed)
    SUBJECTIVE:   CHIEF COMPLAINT / HPI:   Headache: Although a bilateral temporal headache for the past month.  Takes ibuprofen.  Takes up to 600 mg 3 times a day.  Clearly effective, wears off quickly.  No history of migraines.  No prior head injury preceding the headaches.  No nausea or vomiting.  Does have photosensitivity, but no vision changes.  Cough: 1 month of daily cough.  Productive of clear mucus.  No recent prior illness.  1 pack/day smoker for the past 20+ years.  No other tobacco or marijuana use.  Recently stopped all alcohol use 6 months ago.  Currently taking NyQuil cough and flu but this is not helping.  Has seasonal allergies as well.  Also has GERD.  Takes medication for this as needed but symptoms are mild.  No shortness of breath.  PERTINENT  PMH / PSH: neurofibromatosis 1.   OBJECTIVE:   BP 121/78   Pulse 91   Ht 5\' 6"  (1.676 m)   Wt 176 lb 6.4 oz (80 kg)   SpO2 96%   BMI 28.47 kg/m   General: Alert and oriented.  No acute distress. HEENT: PERRLA, EOMI, moist oral mucosa.  No oropharyngeal erythema. CV: Regular rate and rhythm, no murmurs Pulmonary: Lungs clear to auscultation bilaterally, no wheezes or crackles.  ASSESSMENT/PLAN:   Chronic tension-type headache, not intractable Chronic daily headache for the past month that coincided with cough, although history is not convincing for cough induced headache given his neurofibromatosis type I we will get MRI of brain.  Last MRI brain in 2015.  Will get BMP given his NSAID use.  Advised him to switch to Aleve from the ibuprofen.  Chronic cough Patient has imaging consistent with emphysema.  Does not appear to have formal diagnosis of COPD.  Was on daily LABA/LAMA but has not taken this in a long time.  Recent thoracic spine MRI did not suggest any lung pathology. - Chest x-ray - Restart daily controller inhaler.     Benay Pike, MD [place]

## 2020-09-28 NOTE — Patient Instructions (Addendum)
Was nice to see you today,  For your cough I have ordered an x-ray.  I have also refilled your once a day maintenance inhaler for your emphysema.  For your headaches, due to your neurofibromatosis I will order an MRI of your brain.  I will call you with the results when I get them.  I want to get some blood test to check your kidney function.  Try taking Aleve twice a day instead of the ibuprofen.  This is better on your kidneys and stomach.  Please schedule an appointment with your PCP in approximately 2 weeks.  Have a great day,  Clemetine Marker, MD

## 2020-09-29 ENCOUNTER — Ambulatory Visit
Admission: RE | Admit: 2020-09-29 | Discharge: 2020-09-29 | Disposition: A | Discharge status: Home / Self Care | Payer: Medicaid Other | Source: Ambulatory Visit | Attending: Family Medicine | Admitting: Family Medicine

## 2020-09-29 DIAGNOSIS — R053 Chronic cough: Secondary | ICD-10-CM

## 2020-09-29 LAB — BASIC METABOLIC PANEL
BUN/Creatinine Ratio: 12 (ref 9–20)
BUN: 12 mg/dL (ref 6–24)
CO2: 21 mmol/L (ref 20–29)
Calcium: 9.6 mg/dL (ref 8.7–10.2)
Chloride: 102 mmol/L (ref 96–106)
Creatinine, Ser: 1.02 mg/dL (ref 0.76–1.27)
Glucose: 76 mg/dL (ref 65–99)
Potassium: 3.8 mmol/L (ref 3.5–5.2)
Sodium: 139 mmol/L (ref 134–144)
eGFR: 94 mL/min/{1.73_m2} (ref >59)

## 2020-10-03 ENCOUNTER — Other Ambulatory Visit: Payer: Self-pay | Admitting: Family Medicine

## 2020-10-03 MED ORDER — DOXYCYCLINE HYCLATE 100 MG PO TABS: 100.0000 mg | ORAL_TABLET | Freq: Two times a day (BID) | ORAL | 0 refills | Status: AC

## 2020-10-03 MED ORDER — AMOXICILLIN-POT CLAVULANATE 875-125 MG PO TABS: 1.0000 | ORAL_TABLET | Freq: Two times a day (BID) | ORAL | 0 refills | Status: AC

## 2020-10-03 NOTE — Progress Notes (Signed)
Discussed with pt.  Will treat with abx.  If symptoms not improved will schedule new appt.

## 2020-10-15 ENCOUNTER — Ambulatory Visit
Admission: RE | Admit: 2020-10-15 | Discharge: 2020-10-15 | Disposition: A | Discharge status: Home / Self Care | Payer: Medicaid Other | Source: Ambulatory Visit | Attending: Family Medicine | Admitting: Family Medicine

## 2020-10-15 DIAGNOSIS — G44229 Chronic tension-type headache, not intractable: Secondary | ICD-10-CM

## 2020-10-15 MED ORDER — GADOBENATE DIMEGLUMINE 529 MG/ML IV SOLN
16.0000 mL | Freq: Once | INTRAVENOUS | Status: AC | PRN
  Administered 2020-10-15: 16 mL via INTRAVENOUS

## 2020-10-18 ENCOUNTER — Telehealth: Payer: Self-pay | Admitting: Family Medicine

## 2020-10-18 NOTE — Telephone Encounter (Signed)
Called patient to discuss his normal MRI results.  Pt still having headache and still having cough.  Has been using the prescribed daily inhaler since our last appointment.  Advised pt to call our office back and ask them to schedule a visit with the pharmacist team for pulmonary function testing.

## 2020-11-19 ENCOUNTER — Other Ambulatory Visit: Payer: Self-pay | Admitting: Family Medicine

## 2020-11-29 ENCOUNTER — Other Ambulatory Visit: Payer: Self-pay

## 2020-11-29 ENCOUNTER — Ambulatory Visit (INDEPENDENT_AMBULATORY_CARE_PROVIDER_SITE_OTHER): Payer: Medicaid Other | Admitting: Family Medicine

## 2020-11-29 VITALS — BP 108/78 | HR 101 | Ht 66.0 in | Wt 179.2 lb

## 2020-11-29 DIAGNOSIS — M79601 Pain in right arm: Secondary | ICD-10-CM

## 2020-11-29 DIAGNOSIS — Q8501 Neurofibromatosis, type 1: Secondary | ICD-10-CM | POA: Diagnosis not present

## 2020-11-29 MED ORDER — DICLOFENAC SODIUM 1 % EX GEL: 4.0000 g | Freq: Four times a day (QID) | CUTANEOUS | 0 refills | Status: DC | PRN

## 2020-11-29 NOTE — Patient Instructions (Addendum)
It was nice seeing you today!  I am referring you to physical therapy. They will call you for an appointment.  Try Voltaren gel for pain. Stop taking the ibuprofen.  Follow-up with your neurologist if things are not getting better.  Stay well, Zola Button, MD [place] [phone]

## 2020-11-29 NOTE — Progress Notes (Signed)
    SUBJECTIVE:   CHIEF COMPLAINT / HPI:   Right arm pain Pain radiates along right upper arm down to forearm and hand. Hard to sleep due to pain. Has been going on for 1 month. Taking ibuprofen (two 600 mg tablets every 8 hours) without much relief. Worse with movement of arm. Sometimes feels weak in the right hand. No known injuries. Denies neck pain, numbness/tingling.  Does not feel like neurofibromatosis pain.  Last MRI in January 2022 revealed stable neurofibromas at T2 and T3 nerve roots on left and C8 and T1 on the right.   PERTINENT  PMH / PSH: neurofibromatosis (type 1)  OBJECTIVE:   BP 108/78   Pulse (!) 101   Ht 5\' 6"  (1.676 m)   Wt 179 lb 3.2 oz (81.3 kg)   SpO2 96%   BMI 28.92 kg/m   General: muscular male, NAD CV: RRR, no murmurs Pulm: CTAB, no wheezes or rales MSK: no paraspinal or midline neck tenderness, mild tenderness to palpation along right upper lateral arm, no forearm or hand/wrist tenderness, pain with right elbow flexion and supination Neuro: alert, full strength bilateral upper extremities  ASSESSMENT/PLAN:   Right arm pain Unclear etiology with no known trigger or injury for 1 month. Will treat supportively for now, can consider imaging if not improving. May need advanced imaging given history of neurofibromatosis but will defer to neurology. - diclofenac gel - amb ref PT - f/u with neurology if not improving in 1 month     Zola Button, MD [place]

## 2020-12-08 ENCOUNTER — Ambulatory Visit: Payer: Medicaid Other

## 2020-12-08 ENCOUNTER — Other Ambulatory Visit: Payer: Self-pay | Admitting: Family Medicine

## 2020-12-28 ENCOUNTER — Other Ambulatory Visit: Payer: Self-pay | Admitting: Family Medicine

## 2021-01-08 ENCOUNTER — Other Ambulatory Visit: Payer: Self-pay | Admitting: Family Medicine

## 2021-01-30 ENCOUNTER — Other Ambulatory Visit: Payer: Self-pay | Admitting: Family Medicine

## 2021-02-16 ENCOUNTER — Other Ambulatory Visit: Payer: Self-pay

## 2021-02-16 MED ORDER — IBUPROFEN 600 MG PO TABS: 600.0000 mg | ORAL_TABLET | Freq: Three times a day (TID) | ORAL | 1 refills | Status: DC | PRN

## 2021-04-13 NOTE — Patient Instructions (Addendum)
It was nice seeing you today!  Start taking amlodipine 2.5 mg daily. Check your blood pressure multiple times a week if you can and write them down.  Therapy resources below.  Start taking Wellbutrin again to help with smoking. Use nicotine patch once a day and nicotine lozenge as needed.  Schedule a nurse visit for BP check in two weeks.  Please arrive at least 15 minutes prior to your scheduled appointments.  Stay well, Zola Button, MD [place] [phone]    Therapy and Counseling Resources Most providers on this list will take Medicaid. Patients with commercial insurance or Medicare should contact their insurance company to get a list of in network providers.  [facility]Psychiatry and Counseling [address] [facility] [address] [place], [place] [address] [place], [place], [place] [address]      [facility] [address]  [place], [place] [address] [phone]  [facility] [address], [place]  [place], [place] [address]       [phone]     [facility] (virtual only) [phone]  [facility] [address] [place], [place]  [facility]:  [place]. [place] [phone]  [facility]:  [place] [address] [place] [phone]  [facility] (under & uninsured) [address] [place] [phone]    [email or web address]    [place] [place] [address]  [place]    [phone]  [facility] [place]     Phone:  [phone]     [place]  [address]      [place], [place] ext [place]: [place], [place], [place]   [place] ([place] therapist) [email or web address]  [place] [address]   [place] [address]    [phone]    [facility]   [address],  [place], [place]   [place]  [place]  [facility]  [address] [place], [place]        [phone]  Open Access/Walk In Clinic under & uninsured  [facility]  [address] [place], [place] Crisis [phone]  [facility] the [place],  ([place])   [place], [place]: [phone]) 8:30 - 12; 1 - 2:30  [facility] of the [place],  [place], [place]    [phone]):8:30 - 12; 2 - 3PM  [facility] [employer],  [address]  [place]; [phone]):   Mon - Fri 8 AM - 5 PM  [facility] [place]  MWF 12:30 to 3:00 or call to schedule an appointment  [phone]  Specific Provider options Psychology Today  [email or web address] click on find a therapist  enter your zip code left side and select or tailor a therapist for your specific need.   [facility] Provider Directory [email or web address]  (Medicaid)   Follow all drop down to find a provider  [place] or [email or web address] [address], [place], [place] Recovery support  and educational   24- Hour Availability:   [facility]  [address]  [place], [place] Crisis [phone]  [facility] of the [employer] [phone]  [place]  [phone]   [place]  [phone] (after hours)  Therapeutic Alternative/Mobile Crisis   [phone]  [place] National Suicide Hotline  [phone] [place]am[place]ina [place])  Call 911 or go to  emergency room  [facility]  [phone]);  [place] and [employer]  [phone]); [place], [place], [place], [place], [place], [place], [place]

## 2021-04-13 NOTE — Progress Notes (Signed)
    SUBJECTIVE:   CHIEF COMPLAINT / HPI:   HTN Ran out of his HCTZ 2 days ago because Medicaid stopped covering this medication.  Asking for a different medication. Does not check BP regularly.   Tobacco abuse Currently smoking 0.5 packs/day.  Amenable to restarting bupropion.  Would like to try nicotine replacement therapy.  Stress Patient reports increased stress from the recent death of his father.  He is also in the middle of a court case involving one of his friends who molested one of his children.  Denies SI.  He will be staying locally for Thanksgiving.  PERTINENT  PMH / PSH: neurofibromatosis type I, alcohol abuse (sober since summer 2021), tobacco abuse, HTN, GERD, depression  OBJECTIVE:   BP (!) 132/100   Pulse 87   Ht 5\' 6"  (1.676 m)   Wt 178 lb (80.7 kg)   BMI 28.73 kg/m   General: Alert, NAD CV: RRR, no murmurs Pulm: CTAB, no wheezes or rales  Depression screen PHQ 2/9 04/14/2021  Decreased Interest 2  Down, Depressed, Hopeless 2  PHQ - 2 Score 4  Altered sleeping 2  Tired, decreased energy 2  Change in appetite 1  Feeling bad or failure about yourself  1  Trouble concentrating 2  Moving slowly or fidgety/restless 1  Suicidal thoughts 1  PHQ-9 Score 14  Difficult doing work/chores -  Some recent data might be hidden     ASSESSMENT/PLAN:   Gastroesophageal reflux disease Has not been needing to take PPI so this was discontinued.  Hypertension Switch HCTZ to amlodipine 2.5 mg due to insurance reasons.  Advised to keep BP log at home.  Follow-up in 2 weeks for RN BP check.  Depression, recurrent ([place]) Worsening due to recent stressors.  Bupropion restarted.  Therapy resources given.  Tobacco use disorder 0.5 pack/day smoker.  Working on cutting back.  Ibuprofen restarted.  Also added nicotine patch and lozenge.  Follow-up in 1 month.     Zola Button, MD [place]

## 2021-04-14 ENCOUNTER — Ambulatory Visit (INDEPENDENT_AMBULATORY_CARE_PROVIDER_SITE_OTHER): Payer: Medicaid Other | Admitting: Family Medicine

## 2021-04-14 ENCOUNTER — Other Ambulatory Visit: Payer: Self-pay

## 2021-04-14 ENCOUNTER — Encounter: Payer: Self-pay | Admitting: Family Medicine

## 2021-04-14 VITALS — BP 132/100 | HR 87 | Ht 66.0 in | Wt 178.0 lb

## 2021-04-14 DIAGNOSIS — F17200 Nicotine dependence, unspecified, uncomplicated: Secondary | ICD-10-CM | POA: Diagnosis not present

## 2021-04-14 DIAGNOSIS — K219 Gastro-esophageal reflux disease without esophagitis: Secondary | ICD-10-CM | POA: Diagnosis not present

## 2021-04-14 DIAGNOSIS — I10 Essential (primary) hypertension: Secondary | ICD-10-CM | POA: Diagnosis present

## 2021-04-14 DIAGNOSIS — Z23 Encounter for immunization: Secondary | ICD-10-CM | POA: Diagnosis not present

## 2021-04-14 DIAGNOSIS — F339 Major depressive disorder, recurrent, unspecified: Secondary | ICD-10-CM | POA: Diagnosis not present

## 2021-04-14 MED ORDER — NICOTINE 7 MG/24HR TD PT24: 7.0000 mg | MEDICATED_PATCH | Freq: Every day | TRANSDERMAL | 5 refills | Status: DC

## 2021-04-14 MED ORDER — ALBUTEROL SULFATE HFA 108 (90 BASE) MCG/ACT IN AERS: 2.0000 | INHALATION_SPRAY | Freq: Four times a day (QID) | RESPIRATORY_TRACT | 2 refills | Status: DC | PRN

## 2021-04-14 MED ORDER — BUPROPION HCL ER (XL) 150 MG PO TB24: 150.0000 mg | ORAL_TABLET | Freq: Every day | ORAL | 1 refills | Status: DC

## 2021-04-14 MED ORDER — NICOTINE POLACRILEX 2 MG MT LOZG: 2.0000 mg | LOZENGE | OROMUCOSAL | 0 refills | Status: DC | PRN

## 2021-04-14 MED ORDER — AMLODIPINE BESYLATE 2.5 MG PO TABS
2.5000 mg | ORAL_TABLET | Freq: Every day | ORAL | 3 refills | Status: DC
Start: 2021-04-14 — End: 2021-07-24

## 2021-04-14 NOTE — Assessment & Plan Note (Signed)
0.5 pack/day smoker.  Working on cutting back.  Ibuprofen restarted.  Also added nicotine patch and lozenge.  Follow-up in 1 month.

## 2021-04-14 NOTE — Assessment & Plan Note (Signed)
Worsening due to recent stressors.  Bupropion restarted.  Therapy resources given.

## 2021-04-14 NOTE — Assessment & Plan Note (Signed)
Has not been needing to take PPI so this was discontinued.

## 2021-04-14 NOTE — Assessment & Plan Note (Signed)
Switch HCTZ to amlodipine 2.5 mg due to insurance reasons.  Advised to keep BP log at home.  Follow-up in 2 weeks for RN BP check.

## 2021-04-27 ENCOUNTER — Other Ambulatory Visit: Payer: Self-pay | Admitting: Family Medicine

## 2021-04-28 ENCOUNTER — Ambulatory Visit: Payer: Medicaid Other

## 2021-05-28 HISTORY — PX: COLONOSCOPY: SHX174

## 2021-06-02 ENCOUNTER — Other Ambulatory Visit: Payer: Self-pay | Admitting: Family Medicine

## 2021-07-23 NOTE — Progress Notes (Addendum)
SUBJECTIVE:   CHIEF COMPLAINT / HPI:  Chief Complaint  Patient presents with   Annual Exam    Reports blurred vision in both eyes for 2 weeks which is constant. Denies eye pain, headaches, limb weakness. He has not seen an eye doctor in a long time. Last visit with neurology over 1 year ago.  Asking about prostate cancer screening. Father had prostate cancer.  HTN Switch from HCTZ to amlodipine 2.5 mg at last visit due to insurance reasons.  Tobacco use disorder 0.5 ppd smoker. Restarted buproprion and added nicotine patch and lozenge at last visit. Still smoking 0.5 ppd. Nicotine patch irritated his skin so he stopped.  PERTINENT  PMH / PSH: neurofibromatosis type I, alcohol abuse (sober since summer 2021), tobacco abuse, HTN, GERD, depression  Patient Care Team: Zola Button, MD as PCP - General (Family Medicine)   OBJECTIVE:   BP (!) 147/97    Pulse (!) 110    Ht 5\' 6"  (1.676 m)    Wt 172 lb 8 oz (78.2 kg)    SpO2 97%    BMI 27.84 kg/m   Physical Exam Vitals reviewed.  Constitutional:      General: He is not in acute distress.    Appearance: Normal appearance.  HENT:     Head: Normocephalic and atraumatic.  Eyes:     General: No scleral icterus.       Right eye: No discharge.        Left eye: No discharge.     Extraocular Movements: Extraocular movements intact.     Conjunctiva/sclera: Conjunctivae normal.     Pupils: Pupils are equal, round, and reactive to light.  Cardiovascular:     Rate and Rhythm: Regular rhythm. Tachycardia present.  Pulmonary:     Effort: Pulmonary effort is normal. No respiratory distress.     Breath sounds: Normal breath sounds.  Musculoskeletal:     Cervical back: Neck supple.  Skin:    General: Skin is warm and dry.  Neurological:     General: No focal deficit present.     Mental Status: He is alert.     Cranial Nerves: No cranial nerve deficit.     Motor: No weakness.     Coordination: Finger-Nose-Finger Test normal.      Deep Tendon Reflexes:     Reflex Scores:      Bicep reflexes are 2+ on the right side and 2+ on the left side.      Brachioradialis reflexes are 2+ on the right side and 2+ on the left side.      Patellar reflexes are 2+ on the right side and 2+ on the left side.    Depression screen [facility] 2/9 07/24/2021  Decreased Interest 0  Down, Depressed, Hopeless 1  PHQ - 2 Score 1  Altered sleeping 1  Tired, decreased energy 1  Change in appetite 1  Feeling bad or failure about yourself  1  Trouble concentrating 1  Moving slowly or fidgety/restless 0  Suicidal thoughts 0  PHQ-9 Score 6  Difficult doing work/chores Somewhat difficult  Some recent data might be hidden    Vision Screening   Right eye Left eye Both eyes  Without correction 20/40 20/40 20/25   With correction        {Show previous vital signs (optional):23777}    ASSESSMENT/PLAN:   Hypertension Not at goal, increase amlodipine to 5 mg. Advised to get a new BP cuff. Check metabolic panel today.  Blurred  vision, bilateral New onset blurred vision x 2 weeks. Neuro exam unremarkable. Could be myopia, also consider optic glioma which can be associated with NF1. - ophthalmology referral - advised close follow-up with neurology, will defer need for imaging to them  Tobacco use disorder Remains 0.5 ppd smoker. Stopped nicotine patch due to skin irritation. Counseling provided.   HCM - Covid bivalent booster due - PSA, patient desires screening - lipid panel - colon cancer screening to start soon, discussed  Return in about 4 weeks (around 08/21/2021) for f/u HTN.   Zola Button, MD [place]

## 2021-07-23 NOTE — Patient Instructions (Addendum)
It was nice seeing you today!  Increase amlodipine to 5 mg. New prescription sent to your pharmacy.  Make sure to call your neurology office to schedule an appointment soon.  Referral to ophthalmologist (eye specialist).  Continue working on cutting back on smoking.  Stay well, Zola Button, MD [place] [phone]  --  Make sure to check out at the front desk before you leave today.  Please arrive at least 15 minutes prior to your scheduled appointments.  If you had blood work today, I will send you a MyChart message or a letter if results are normal. Otherwise, I will give you a call.  If you had a referral placed, they will call you to set up an appointment. Please give [place] a call if you don't hear back in the next 2 weeks.  If you need additional refills before your next appointment, please call your pharmacy first.

## 2021-07-24 ENCOUNTER — Ambulatory Visit (INDEPENDENT_AMBULATORY_CARE_PROVIDER_SITE_OTHER): Payer: Medicaid Other | Admitting: Family Medicine

## 2021-07-24 ENCOUNTER — Encounter: Payer: Self-pay | Admitting: Family Medicine

## 2021-07-24 ENCOUNTER — Other Ambulatory Visit: Payer: Self-pay

## 2021-07-24 VITALS — BP 147/97 | HR 110 | Ht 66.0 in | Wt 172.5 lb

## 2021-07-24 DIAGNOSIS — F17200 Nicotine dependence, unspecified, uncomplicated: Secondary | ICD-10-CM

## 2021-07-24 DIAGNOSIS — Z13220 Encounter for screening for lipoid disorders: Secondary | ICD-10-CM

## 2021-07-24 DIAGNOSIS — Z125 Encounter for screening for malignant neoplasm of prostate: Secondary | ICD-10-CM

## 2021-07-24 DIAGNOSIS — Q8501 Neurofibromatosis, type 1: Secondary | ICD-10-CM

## 2021-07-24 DIAGNOSIS — I10 Essential (primary) hypertension: Secondary | ICD-10-CM

## 2021-07-24 DIAGNOSIS — R7989 Other specified abnormal findings of blood chemistry: Secondary | ICD-10-CM

## 2021-07-24 DIAGNOSIS — Z0000 Encounter for general adult medical examination without abnormal findings: Secondary | ICD-10-CM

## 2021-07-24 DIAGNOSIS — H538 Other visual disturbances: Secondary | ICD-10-CM

## 2021-07-24 MED ORDER — AMLODIPINE BESYLATE 5 MG PO TABS: 5.0000 mg | ORAL_TABLET | Freq: Every day | ORAL | 2 refills | Status: DC

## 2021-07-24 NOTE — Assessment & Plan Note (Signed)
New onset blurred vision x 2 weeks. Neuro exam unremarkable. Could be myopia, also consider optic glioma which can be associated with NF1. - ophthalmology referral - advised close follow-up with neurology, will defer need for imaging to them

## 2021-07-24 NOTE — Assessment & Plan Note (Deleted)
New onset blurred vision x 2 weeks.

## 2021-07-24 NOTE — Assessment & Plan Note (Signed)
Not at goal, increase amlodipine to 5 mg. Advised to get a new BP cuff. Check metabolic panel today.

## 2021-07-24 NOTE — Assessment & Plan Note (Signed)
Remains 0.5 ppd smoker. Stopped nicotine patch due to skin irritation. Counseling provided.

## 2021-07-25 ENCOUNTER — Other Ambulatory Visit: Payer: Self-pay | Admitting: Family Medicine

## 2021-07-25 ENCOUNTER — Encounter: Payer: Self-pay | Admitting: Family Medicine

## 2021-07-25 LAB — LIPID PANEL
Chol/HDL Ratio: 4.2 ratio (ref 0.0–5.0)
Cholesterol, Total: 167 mg/dL (ref 100–199)
HDL: 40 mg/dL (ref >39)
LDL Chol Calc (NIH): 98 mg/dL (ref 0–99)
Triglycerides: 166 mg/dL — ABNORMAL HIGH (ref 0–149)
VLDL Cholesterol Cal: 29 mg/dL (ref 5–40)

## 2021-07-25 LAB — COMPREHENSIVE METABOLIC PANEL
ALT: 8 IU/L (ref 0–44)
AST: 11 IU/L (ref 0–40)
Albumin/Globulin Ratio: 1.9 (ref 1.2–2.2)
Albumin: 4.3 g/dL (ref 4.0–5.0)
Alkaline Phosphatase: 123 IU/L — ABNORMAL HIGH (ref 44–121)
BUN/Creatinine Ratio: 11 (ref 9–20)
BUN: 10 mg/dL (ref 6–24)
Bilirubin Total: 0.2 mg/dL (ref 0.0–1.2)
CO2: 21 mmol/L (ref 20–29)
Calcium: 9.2 mg/dL (ref 8.7–10.2)
Chloride: 105 mmol/L (ref 96–106)
Creatinine, Ser: 0.95 mg/dL (ref 0.76–1.27)
Globulin, Total: 2.3 g/dL (ref 1.5–4.5)
Glucose: 73 mg/dL (ref 70–99)
Potassium: 3.8 mmol/L (ref 3.5–5.2)
Sodium: 141 mmol/L (ref 134–144)
Total Protein: 6.6 g/dL (ref 6.0–8.5)
eGFR: 101 mL/min/{1.73_m2} (ref >59)

## 2021-07-25 LAB — PSA: Prostate Specific Ag, Serum: 1.6 ng/mL (ref 0.0–4.0)

## 2021-08-09 ENCOUNTER — Ambulatory Visit: Payer: Medicaid Other | Admitting: Family Medicine

## 2021-08-09 NOTE — Patient Instructions (Incomplete)
It was nice seeing you today!  Blood work today.  See me in 3 months or whenever is a good for you.  Stay well, Macon Lesesne, MD [facility] [phone]  --  Make sure to check out at the front desk before you leave today.  Please arrive at least 15 minutes prior to your scheduled appointments.  If you had blood work today, I will send you a MyChart message or a letter if results are normal. Otherwise, I will give you a call.  If you had a referral placed, they will call you to set up an appointment. Please give us a call if you don't hear back in the next 2 weeks.  If you need additional refills before your next appointment, please call your pharmacy first.  

## 2021-08-09 NOTE — Progress Notes (Deleted)
? ? ?  SUBJECTIVE:  ? ?CHIEF COMPLAINT / HPI:  ?No chief complaint on file. ?  ?Amlodipine increased to 5 mg last visit. ? ?PERTINENT  PMH / PSH: *** ? ?Patient Care Team: ?Zola Button, MD as PCP - General (Family Medicine)  ? ?OBJECTIVE:  ? ?There were no vitals taken for this visit.  ?Physical Exam  ? ?Depression screen [facility] 2/9 07/24/2021  ?Decreased Interest 0  ?Down, Depressed, Hopeless 1  ?PHQ - 2 Score 1  ?Altered sleeping 1  ?Tired, decreased energy 1  ?Change in appetite 1  ?Feeling bad or failure about yourself  1  ?Trouble concentrating 1  ?Moving slowly or fidgety/restless 0  ?Suicidal thoughts 0  ?PHQ-9 Score 6  ?Difficult doing work/chores Somewhat difficult  ?Some recent data might be hidden  ?  ? ?{Show previous vital signs (optional):23777} ? ?{Labs  Heme  Chem  Endocrine  Serology  Results Review (optional):23779} ? ?ASSESSMENT/PLAN:  ? ?No problem-specific Assessment & Plan notes found for this encounter. ?  ? ?No follow-ups on file.  ? ?Zola Button, MD ?[place]  ?

## 2021-08-21 ENCOUNTER — Ambulatory Visit: Payer: Medicaid Other | Admitting: Neurology

## 2021-08-23 ENCOUNTER — Other Ambulatory Visit: Payer: Self-pay

## 2021-08-23 ENCOUNTER — Encounter: Payer: Self-pay | Admitting: Neurology

## 2021-08-23 ENCOUNTER — Ambulatory Visit: Payer: Medicaid Other | Admitting: Neurology

## 2021-08-23 VITALS — BP 134/89 | HR 94 | Ht 66.0 in | Wt 170.0 lb

## 2021-08-23 DIAGNOSIS — Q8501 Neurofibromatosis, type 1: Secondary | ICD-10-CM | POA: Diagnosis not present

## 2021-08-23 DIAGNOSIS — D3614 Benign neoplasm of peripheral nerves and autonomic nervous system of thorax: Secondary | ICD-10-CM | POA: Diagnosis not present

## 2021-08-23 MED ORDER — CYCLOBENZAPRINE HCL 5 MG PO TABS: ORAL_TABLET | ORAL | 5 refills | Status: DC

## 2021-08-23 NOTE — Progress Notes (Signed)
? ? ?Follow-up Visit ? ? ?Date: 08/23/21 ? ? ?James [name] ?MRN: [id number] ?DOB: 12-28-1975 ? ? ?Interim History: ?James [name] is a 46 y.o. left-handed male with COPD, GERD, alcohol abuse, and tobacco use returning to the clinic for follow-up of neurofibromatosis type 1.  The patient was accompanied to the clinic by self. ? ?History of present illness:  ?He was diagnosed with NF1 after evaluation for chest pain led to MRI thoracic spine showing neurofibromas involving the T2 and III nerve roots on the left. He has chronic sharp pain related to do, as if being punched.  He takes takes ibuprofen almost daily which provides intermittent relief. He intermittent numbness/tingling of the hands.   Over the past several years, there has been no new change to his back pain.  He denies any new vision changes, numbness or tingling, or weakness.  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  He has not been able to establish care with dermatology ophthalmology locally due to being on Medicaid.  He was previously evaluated by Dr. Leta [name] in 2014 for neurofibromatosis type I.  At that time, he had new onset headaches and retro-orbital eye pain.  MRI of the brain and orbit was normal, specifically no evidence of optic glioma.  Patient has strong family history of neurofibromatosis including his mother, brother (deceased) and 2 daughters. He has one son that has unaffected.  ?  ?He was drinking a gallon liquor daily for 5 years, and cut back in January 2020 down to 2 beers daily.  He does not work and has been on disability since 2015 because of falls related to NF1.  He has been sober since 2022. ? ?UPDATE 08/23/2021:    He is here for 1 year follow-up visit.  He continues to have achy low back pain and stiffness.  MRI lumbar spine from 2022 did not show any structural disease of the lumbar spine.  MRI thoracic spine showed stable neurofibromas at C8, T1,  T2, and T3 nerve roots.  He denies shooting back pain or radiating pain down the legs.  No new visual complaints, numbness/tingling or weakness.  ? ?Medications:  ?Current Outpatient Medications on File Prior to Visit  ?Medication Sig Dispense Refill  ? albuterol (PROAIR HFA) 108 (90 Base) MCG/ACT inhaler Inhale 2 puffs into the lungs every 6 (six) hours as needed for wheezing or shortness of breath. 8 g 2  ? amLODipine (NORVASC) 5 MG tablet Take 1 tablet (5 mg total) by mouth daily. 30 tablet 2  ? buPROPion (WELLBUTRIN XL) 150 MG 24 hr tablet Take 1 tablet (150 mg total) by mouth daily. 90 tablet 1  ? ibuprofen (ADVIL) 600 MG tablet TAKE 1 TABLET BY MOUTH EVERY 8 HOURS AS NEEDED 60 tablet 1  ? STIOLTO RESPIMAT 2.5-2.5 MCG/ACT AERS INHALE 2 PUFFS BY MOUTH INTO THE LUNGS DAILY 4 g 3  ? chlorhexidine (PERIDEX) 0.12 % solution RINSE 15 ML'S TWICE DAILY AFTER BREAKFAST/BEFORE BEDTIME FOLLOWING BRUSHING AND FLOSSING (Patient not taking: Reported on 08/23/2021)  0  ? diclofenac Sodium (VOLTAREN) 1 % GEL Apply 4 g topically 4 (four) times daily as needed (arm pain). (Patient not taking: Reported on 09/10/6061) 016 g 0  ? folic acid (FOLVITE) 1 MG tablet TAKE 1 TABLET BY MOUTH EVERY DAY (Patient not taking: Reported on 04/14/2021) 90 tablet 1  ? hydrochlorothiazide (MICROZIDE) 12.5 MG capsule TAKE 1 CAPSULE BY MOUTH EVERY DAY (  Patient not taking: Reported on 08/23/2021) 90 capsule 1  ? nicotine polacrilex (COMMIT) 2 MG lozenge Take 1 lozenge (2 mg total) by mouth as needed for smoking cessation. (Patient not taking: Reported on 08/23/2021) 100 tablet 0  ? ?No current facility-administered medications on file prior to visit.  ? ? ?Allergies: No Known Allergies ? ?Vital Signs:  ?BP 134/89   Pulse 94   Ht '5\' 6"'$  (1.676 m)   Wt 170 lb (77.1 kg)   SpO2 98%   BMI 27.44 kg/m?  ? ?Neurological Exam: ?MENTAL STATUS including orientation to time, place, person, recent and remote memory, attention span and concentration, language, and  fund of knowledge is normal.  Speech is not dysarthric. ? ?CRANIAL NERVES:  No visual field defects.  Pupils equal round and reactive to light.  Normal conjugate, extra-ocular eye movements in all directions of gaze.  No ptosis.  Face is symmetric. Palate elevates symmetrically.  Tongue is midline. ? ?MOTOR:  Motor strength is 5/5 in all extremities.  No atrophy, fasciculations or abnormal movements.  No pronator drift.  Tone is normal.   ? ?MSRs:  Reflexes are 2+/4 throughout. ? ?SENSORY:  Intact to vibration throughout. ? ?COORDINATION/GAIT:  Normal finger-to- nose-finger.  Intact rapid alternating movements bilaterally.  Gait appears mildly antalgic, stable, unassisted.  ? ?Data: ?MRI thoracic and lumbar spine wwo contrast 1/28/20220: ?1. Stable neurofibromas at the T2 and T3 nerve roots on the left and at C8 and T1 on the right. ?2. Stable vertebral body erosions at T2 and T3. ?3. Stable mild degenerative changes of the lumbar spine without significant spinal canal or neural foraminal stenosis. ?4. Stable expansile left iliac wing lesion, probably benign. ? ?IMPRESSION/PLAN: ?Neurofibromatosis type 1 with cutaneous neurofibromas, cafe au laid spots, and neurofibromas involving the C8, T1, T12, and T3 nerve roots.   ? - Check MRI thoracic spine wwo contrast ? - Recommend annual eye exam ? ?2.  Chronic low back pain seems musculoskeletal ? - Start flexeril '5mg'$  BID as needed for low back pain ? ?Return to clinic in 1 year.  ? ? ?Thank you for allowing me to participate in patient's care.  If I can answer any additional questions, I would be pleased to do so.   ? ?Sincerely, ? ? ? ?Aarohi Redditt K. Posey Pronto, DO ? ? ?

## 2021-08-23 NOTE — Patient Instructions (Addendum)
Start flexeril '5mg'$  at bedtime as needed x 1 week, then increase to twice daily as needed ? ?MRI thoracic spine wwo spine ? ?Start home low back stretching exercise ? ?Return to clinic 1 year ? ?

## 2021-09-05 ENCOUNTER — Ambulatory Visit
Admission: RE | Admit: 2021-09-05 | Discharge: 2021-09-05 | Disposition: A | Discharge status: Home / Self Care | Payer: Medicaid Other | Source: Ambulatory Visit | Attending: Neurology | Admitting: Neurology

## 2021-09-05 ENCOUNTER — Other Ambulatory Visit: Payer: Medicaid Other

## 2021-09-05 DIAGNOSIS — Q8501 Neurofibromatosis, type 1: Secondary | ICD-10-CM

## 2021-09-05 DIAGNOSIS — D3614 Benign neoplasm of peripheral nerves and autonomic nervous system of thorax: Secondary | ICD-10-CM

## 2021-09-05 MED ORDER — GADOBENATE DIMEGLUMINE 529 MG/ML IV SOLN
15.0000 mL | Freq: Once | INTRAVENOUS | Status: AC | PRN
  Administered 2021-09-05: 15 mL via INTRAVENOUS

## 2021-09-12 ENCOUNTER — Other Ambulatory Visit: Payer: Self-pay | Admitting: Family Medicine

## 2021-10-11 ENCOUNTER — Other Ambulatory Visit: Payer: Self-pay | Admitting: Family Medicine

## 2021-10-22 ENCOUNTER — Other Ambulatory Visit: Payer: Self-pay | Admitting: Family Medicine

## 2021-10-31 ENCOUNTER — Encounter: Payer: Self-pay | Admitting: *Deleted

## 2021-11-21 ENCOUNTER — Other Ambulatory Visit: Payer: Self-pay | Admitting: Family Medicine

## 2021-11-25 ENCOUNTER — Other Ambulatory Visit: Payer: Self-pay | Admitting: Family Medicine

## 2021-12-04 NOTE — Progress Notes (Unsigned)
    SUBJECTIVE:   CHIEF COMPLAINT / HPI:  No chief complaint on file.     HTN At last visit, amlodipine was increased to 5 mg.  PERTINENT  PMH / PSH: alcohol abuse (sober since summer 2021***), 0.5 ppd smoker, HTN  Patient Care Team: Zola Button, MD as PCP - General (Family Medicine) Alda Berthold, DO as Consulting Physician (Neurology)   OBJECTIVE:   There were no vitals taken for this visit.  Physical Exam      07/24/2021    2:27 PM  Depression screen PHQ 2/9  Decreased Interest 0  Down, Depressed, Hopeless 1  PHQ - 2 Score 1  Altered sleeping 1  Tired, decreased energy 1  Change in appetite 1  Feeling bad or failure about yourself  1  Trouble concentrating 1  Moving slowly or fidgety/restless 0  Suicidal thoughts 0  PHQ-9 Score 6  Difficult doing work/chores Somewhat difficult     {Show previous vital signs (optional):23777}  {Labs  Heme  Chem  Endocrine  Serology  Results Review (optional):23779}  ASSESSMENT/PLAN:   No problem-specific Assessment & Plan notes found for this encounter.    No follow-ups on file.   Zola Button, MD [place]

## 2021-12-04 NOTE — Patient Instructions (Incomplete)
It was nice seeing you today!  I have sent in naltrexone.  [facility] Address: [address] [place], [place] [address] Phone: [phone]  Stay well, Littie Deeds, MD [facility] [phone]  --  Make sure to check out at the front desk before you leave today.  Please arrive at least 15 minutes prior to your scheduled appointments.  If you had blood work today, I will send you a MyChart message or a letter if results are normal. Otherwise, I will give you a call.  If you had a referral placed, they will call you to set up an appointment. Please give [place] a call if you don't hear back in the next 2 weeks.  If you need additional refills before your next appointment, please call your pharmacy first.

## 2021-12-05 ENCOUNTER — Encounter: Payer: Self-pay | Admitting: Family Medicine

## 2021-12-05 ENCOUNTER — Ambulatory Visit (INDEPENDENT_AMBULATORY_CARE_PROVIDER_SITE_OTHER): Payer: Medicaid Other | Admitting: Family Medicine

## 2021-12-05 VITALS — BP 117/73 | HR 97 | Ht 66.0 in | Wt 165.2 lb

## 2021-12-05 DIAGNOSIS — F1010 Alcohol abuse, uncomplicated: Secondary | ICD-10-CM

## 2021-12-05 DIAGNOSIS — H538 Other visual disturbances: Secondary | ICD-10-CM | POA: Diagnosis not present

## 2021-12-05 DIAGNOSIS — I10 Essential (primary) hypertension: Secondary | ICD-10-CM

## 2021-12-05 DIAGNOSIS — F1020 Alcohol dependence, uncomplicated: Secondary | ICD-10-CM | POA: Diagnosis present

## 2021-12-05 MED ORDER — NALTREXONE HCL 50 MG PO TABS: 50.0000 mg | ORAL_TABLET | Freq: Every day | ORAL | 2 refills | Status: DC

## 2021-12-05 NOTE — Assessment & Plan Note (Signed)
Well-controlled in office today, continue amlodipine 5 mg

## 2021-12-05 NOTE — Assessment & Plan Note (Signed)
Recent relapse but is motivated to stay sober. - restart naltrexone 50 mg daily

## 2021-12-05 NOTE — Assessment & Plan Note (Signed)
Ophthalmology contact information provided.

## 2021-12-23 ENCOUNTER — Other Ambulatory Visit: Payer: Self-pay | Admitting: Family Medicine

## 2022-01-24 ENCOUNTER — Emergency Department (HOSPITAL_COMMUNITY)
Admission: EM | Admit: 2022-01-24 | Discharge: 2022-01-24 | Disposition: A | Discharge status: Home / Self Care | Payer: Medicaid Other | Attending: Emergency Medicine | Admitting: Emergency Medicine

## 2022-01-24 ENCOUNTER — Other Ambulatory Visit: Payer: Self-pay

## 2022-01-24 ENCOUNTER — Encounter (HOSPITAL_COMMUNITY): Payer: Self-pay

## 2022-01-24 DIAGNOSIS — H0012 Chalazion right lower eyelid: Secondary | ICD-10-CM | POA: Diagnosis not present

## 2022-01-24 DIAGNOSIS — H5711 Ocular pain, right eye: Secondary | ICD-10-CM | POA: Diagnosis present

## 2022-01-24 NOTE — ED Provider Notes (Signed)
[facility] EMERGENCY DEPARTMENT Provider Note   CSN: [id number] Arrival date & time: 01/24/22  9323     History  Chief Complaint  Patient presents with   Eye Pain    James [name] is a 46 y.o. male.   Eye Pain    Pt here with right lower eyelid bump/discomfort for several weeks. Pt states that he used warm compresses and had resolution of his last 'stye" but this one is more hard and won't go away.   No vision changes.      Home Medications Prior to Admission medications   Medication Sig Start Date End Date Taking? Authorizing Provider  amLODipine (NORVASC) 5 MG tablet TAKE 1 TABLET (5 MG TOTAL) BY MOUTH DAILY. 12/25/21   Zola Button, MD  buPROPion (WELLBUTRIN XL) 150 MG 24 hr tablet Take 1 tablet (150 mg total) by mouth daily. 04/14/21   Zola Button, MD  cyclobenzaprine (FLEXERIL) 5 MG tablet Take 1 tablet at bedtime as needed for 1 week, if tolerating you can take 1 tablet twice daily as needed. 08/23/21   Narda Amber K, DO  folic acid (FOLVITE) 1 MG tablet TAKE 1 TABLET BY MOUTH EVERY DAY Patient not taking: Reported on 04/14/2021 12/08/20   Delora Fuel, MD  ibuprofen (ADVIL) 600 MG tablet TAKE 1 TABLET BY MOUTH EVERY 8 HOURS AS NEEDED 11/21/21   Zola Button, MD  naltrexone (DEPADE) 50 MG tablet Take 1 tablet (50 mg total) by mouth daily. 12/05/21   Zola Button, MD  STIOLTO RESPIMAT 2.5-2.5 MCG/ACT AERS INHALE 2 PUFFS BY MOUTH INTO THE LUNGS DAILY 10/11/21   Zola Button, MD  VENTOLIN HFA 108 (90 Base) MCG/ACT inhaler TAKE 2 PUFFS BY MOUTH EVERY 6 HOURS AS NEEDED FOR WHEEZE OR SHORTNESS OF BREATH 11/27/21   Zola Button, MD      Allergies    Patient has no known allergies.    Review of Systems   Review of Systems  Eyes:  Positive for pain.    Physical Exam Updated Vital Signs BP 138/85   Pulse 95   Temp 98.5 F (36.9 C) (Oral)   Resp 16   Ht '5\' 7"'$  (1.702 m)   Wt 65.8 kg   SpO2 98%   BMI 22.71 kg/m  Physical Exam Vitals and nursing  note reviewed.  Constitutional:      General: He is not in acute distress.    Appearance: Normal appearance. He is not ill-appearing.  HENT:     Head: Normocephalic and atraumatic.  Eyes:     General: No scleral icterus.       Right eye: No discharge.        Left eye: No discharge.     Conjunctiva/sclera: Conjunctivae normal.     Comments: R lower lid chalazion   Pulmonary:     Effort: Pulmonary effort is normal.     Breath sounds: No stridor.  Neurological:     Mental Status: He is alert and oriented to person, place, and time. Mental status is at baseline.     ED Results / Procedures / Treatments   Labs (all labs ordered are listed, but only abnormal results are displayed) Labs Reviewed - No data to display  EKG None  Radiology No results found.  Procedures Procedures    Medications Ordered in ED Medications - No data to display  ED Course/ Medical Decision Making/ A&P  Medical Decision Making  Pt here with right lower eyelid bump/discomfort for several weeks. Pt states that he used warm compresses and had resolution of his last 'stye" but this one is more hard and won't go away.   No vision changes.   Pt w chalazion vs perhaps stye.   Ophtho FU and warm compresses. Return precautions.    Final Clinical Impression(s) / ED Diagnoses Final diagnoses:  Chalazion of right lower eyelid    Rx / DC Orders ED Discharge Orders     None         Tedd Sias, [place] 01/24/22 [place], [place], DO 01/25/22 [phone]

## 2022-01-24 NOTE — Discharge Instructions (Addendum)
I have given you the information for an ophthalmologist to follow-up with.  I suspect that the lesion on your eyelid is a chalazion.  With good hygiene with warm soapy water, using moisturizing preservative-free such as refresh or optive.   Also recommend warm compresses and to follow-up with an ophthalmologist.

## 2022-01-24 NOTE — ED Triage Notes (Signed)
Pt arrived POV c/o right eye pain. PT states he was here a couple weeks ago with a stye on his right lower eye. Pt was told to keep warm compresses on it. Pt states a week ago it busted but now it is right back and is painful.

## 2022-01-25 ENCOUNTER — Other Ambulatory Visit: Payer: Self-pay | Admitting: Family Medicine

## 2022-01-25 ENCOUNTER — Ambulatory Visit: Payer: Medicaid Other

## 2022-04-02 ENCOUNTER — Other Ambulatory Visit: Payer: Self-pay | Admitting: Nurse Practitioner

## 2022-04-02 DIAGNOSIS — E0590 Thyrotoxicosis, unspecified without thyrotoxic crisis or storm: Secondary | ICD-10-CM

## 2022-04-04 ENCOUNTER — Ambulatory Visit
Admission: RE | Admit: 2022-04-04 | Discharge: 2022-04-04 | Disposition: A | Discharge status: Home / Self Care | Payer: Medicaid Other | Source: Ambulatory Visit | Attending: Nurse Practitioner | Admitting: Nurse Practitioner

## 2022-04-04 DIAGNOSIS — E0590 Thyrotoxicosis, unspecified without thyrotoxic crisis or storm: Secondary | ICD-10-CM

## 2022-04-23 ENCOUNTER — Ambulatory Visit: Payer: Medicaid Other | Admitting: Pulmonary Disease

## 2022-04-23 ENCOUNTER — Encounter: Payer: Self-pay | Admitting: Pulmonary Disease

## 2022-04-23 VITALS — BP 130/90 | HR 79 | Temp 97.7°F | Ht 67.0 in | Wt 155.4 lb

## 2022-04-23 DIAGNOSIS — J431 Panlobular emphysema: Secondary | ICD-10-CM | POA: Diagnosis not present

## 2022-04-23 DIAGNOSIS — R9389 Abnormal findings on diagnostic imaging of other specified body structures: Secondary | ICD-10-CM

## 2022-04-23 NOTE — Patient Instructions (Signed)
Continue Stiolto and albuterol as needed  Graded exercise as tolerated  I will see you back in 4 to 6 weeks  We will get a CT scan without contrast to compare with previous  Obtain breathing study on day of next visit  You need to quit smoking  Call with significant concerns  Alpha-1 antitrypsin level and phenotype

## 2022-04-23 NOTE — Progress Notes (Signed)
James [name]    [id number]    07-Jun-1975  Primary Care Physician:Sun, Delfino Lovett, MD  Referring Physician: Dianna Rossetti, NP [phone],  [place] [address]  Chief complaint:   Patient being seen for shortness of breath Recent chest x-ray  HPI:  Patient with shortness of breath on exertion Exercise limited about 1 mile at most He does have a history of neurofibromatosis which causes him a lot of back pain and discomfort  An active smoker, about half a pack a day  History of asthma  Worked in a kitchen environment in the past  History of hypertension. History of anxiety/depression, joint pain he is on Stiolto and albuterol as needed  He stated there was a debate in the past whether he had obstructive lung disease or not  Outpatient Encounter Medications as of 04/23/2022  Medication Sig   amLODipine (NORVASC) 5 MG tablet TAKE 1 TABLET (5 MG TOTAL) BY MOUTH DAILY.   buPROPion (WELLBUTRIN XL) 150 MG 24 hr tablet TAKE 1 TABLET BY MOUTH EVERY DAY   cyclobenzaprine (FLEXERIL) 5 MG tablet Take 1 tablet at bedtime as needed for 1 week, if tolerating you can take 1 tablet twice daily as needed.   folic acid (FOLVITE) 1 MG tablet TAKE 1 TABLET BY MOUTH EVERY DAY (Patient not taking: Reported on 04/14/2021)   ibuprofen (ADVIL) 600 MG tablet TAKE 1 TABLET BY MOUTH EVERY 8 HOURS AS NEEDED   naltrexone (DEPADE) 50 MG tablet Take 1 tablet (50 mg total) by mouth daily.   STIOLTO RESPIMAT 2.5-2.5 MCG/ACT AERS INHALE 2 PUFFS BY MOUTH INTO THE LUNGS DAILY   VENTOLIN HFA 108 (90 Base) MCG/ACT inhaler TAKE 2 PUFFS BY MOUTH EVERY 6 HOURS AS NEEDED FOR WHEEZE OR SHORTNESS OF BREATH   No facility-administered encounter medications on file as of 04/23/2022.    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 2013   Neuromuscular disorder ([place]) 2014   neurofibrosis   Shortness of breath    with exertion   Vomiting blood     Past Surgical History:  Procedure Laterality Date   ESOPHAGOGASTRODUODENOSCOPY (EGD) WITH PROPOFOL  05/14/2012   Procedure: ESOPHAGOGASTRODUODENOSCOPY (EGD) WITH PROPOFOL;  Surgeon: Arta Silence, MD;  Location: [facility];  Service: Endoscopy;  Laterality: N/A;   none     UPPER GASTROINTESTINAL ENDOSCOPY      Family History  Problem Relation Age of Onset   Diabetes Mother    Hypertension Mother    Hypertension Father    Diabetes Father    Prostate cancer Father    Neurofibromatosis Brother        deceased   Neurofibromatosis Daughter    Neurofibromatosis Daughter    Colon cancer Neg Hx    Esophageal cancer Neg Hx    Rectal cancer Neg Hx    Inflammatory bowel disease Neg  Hx    Liver disease Neg Hx    Pancreatic cancer Neg Hx    Stomach cancer Neg Hx     Social History   Socioeconomic History   Marital status: Married    Spouse name: Roselyn Reef   Number of children: 3   Years of education: 11th   Highest education level: Not on file  Occupational History   Occupation: disablity  Tobacco Use   Smoking status: Every Day    Packs/day: 0.50    Years: 15.00    Total pack years: 7.50    Types: Cigarettes    Start date: 05/28/2002   Smokeless tobacco: Never   Tobacco comments:    tobacco infor given 04/01/18  Vaping Use   Vaping Use: Never used  Substance and Sexual Activity   Alcohol use: Yes    Comment: drinks about 1 pint of liquor per day    Drug use: No   Sexual activity: Not on file   Other Topics Concern   Not on file  Social History Narrative   Unemployed. Recent patient of Antonietta Jewel, MD of [place] transferred to our practice after being in jail and losing insurance.       Lives in his parents home with his wife and 2 kids. Completed some High School.    Best contact [phone] and ok to leave a message.       Quit smoking early 7/13. Quit ETOH abuse early 203 after being told he had an upset stomach lining.       Started back smoking 05/2012. 3 cigarettes daily      Caffeine use: very little      Patient is left-handed. He lives with his wife and children in a one level home. He does not regularly exercise.   Social Determinants of Health   Financial Resource Strain: Not on file  Food Insecurity: Not on file  Transportation Needs: Not on file  Physical Activity: Not on file  Stress: Not on file  Social Connections: Not on file  Intimate Partner Violence: Not on file    Review of Systems  Respiratory:  Positive for cough and shortness of breath.     Vitals:   04/23/22 0943  BP: (!) 130/90  Pulse: 79  Temp: 97.7 F (36.5 C)  SpO2: 100%     Physical Exam Constitutional:      Appearance: Normal appearance.  HENT:     Head: Normocephalic.     Mouth/Throat:     Mouth: Mucous membranes are moist.  Eyes:     Pupils: Pupils are equal, round, and reactive to light.  Cardiovascular:     Rate and Rhythm: Normal rate and regular rhythm.     Heart sounds: No murmur heard.    No friction rub.  Pulmonary:     Effort: No respiratory distress.     Breath sounds: No stridor. No wheezing or rhonchi.  Musculoskeletal:     Cervical back: No rigidity or tenderness.  Neurological:     Mental Status: He is alert.  Psychiatric:        Mood and Affect: Mood normal.    Data Reviewed: Previous spirometry from 2018 did not show obstruction  CT scan from 2017 is consistent with multiple bullae and emphysematous changes Possible fibroma left  chest wall  Assessment:  COPD/emphysema  Shortness of breath on exertion  Activity limitation  Need to evaluate for alpha-1 antitrypsin deficiency  Active smoker   Plan/Recommendations:  Smoking cessation counseling  Obtain full pulm PFT  Repeat CT scan of the chest  Graded exercise as tolerated  Importance of quitting smoking was reiterated  Continue Stiolto and albuterol as needed   Sherrilyn Rist MD [facility] 04/23/2022, 10:01 AM  CC: Dianna Rossetti, NP

## 2022-04-23 NOTE — Addendum Note (Signed)
Addended by: Loma Sousa on: 04/23/2022 04:04 PM   Modules accepted: Orders

## 2022-05-24 ENCOUNTER — Other Ambulatory Visit: Payer: Medicaid Other

## 2022-05-25 ENCOUNTER — Ambulatory Visit (AMBULATORY_SURGERY_CENTER): Payer: Medicaid Other

## 2022-05-25 VITALS — Ht 67.0 in | Wt 155.0 lb

## 2022-05-25 DIAGNOSIS — Z1211 Encounter for screening for malignant neoplasm of colon: Secondary | ICD-10-CM

## 2022-05-25 MED ORDER — PLENVU 140 G PO SOLR: 1.0000 | Freq: Once | ORAL | 0 refills | Status: AC

## 2022-05-25 NOTE — Progress Notes (Signed)

## 2022-05-31 ENCOUNTER — Telehealth: Payer: Self-pay | Admitting: Gastroenterology

## 2022-05-31 NOTE — Telephone Encounter (Signed)
This will go to the previsit pool

## 2022-05-31 NOTE — Telephone Encounter (Signed)
PT has a colonoscopy scheduled for 1/17. [facility] has advised him that they are on back order for Plenvu and he needs an alternative. Please advise.

## 2022-06-01 ENCOUNTER — Other Ambulatory Visit: Payer: Self-pay

## 2022-06-01 ENCOUNTER — Encounter: Payer: Self-pay | Admitting: Pulmonary Disease

## 2022-06-01 ENCOUNTER — Ambulatory Visit: Payer: Medicaid Other | Admitting: Pulmonary Disease

## 2022-06-01 VITALS — BP 128/84 | HR 85 | Temp 98.2°F | Ht 67.0 in | Wt 155.2 lb

## 2022-06-01 DIAGNOSIS — J439 Emphysema, unspecified: Secondary | ICD-10-CM | POA: Diagnosis not present

## 2022-06-01 DIAGNOSIS — Z1211 Encounter for screening for malignant neoplasm of colon: Secondary | ICD-10-CM

## 2022-06-01 MED ORDER — PLENVU 140 G PO SOLR: 1.0000 | Freq: Once | ORAL | 0 refills | Status: AC

## 2022-06-01 MED ORDER — PLENVU 140 G PO SOLR: 1.0000 | ORAL | Status: DC

## 2022-06-01 NOTE — Progress Notes (Signed)
James [name]    [id number]    22-Apr-1976  Primary Care Physician:Sun, Delfino Lovett, MD  Referring Physician: Zola Button, MD [address] [place],  [place] [address]  Chief complaint:   Patient being seen for shortness of breath Recent chest x-ray  HPI:  Has quit smoking since the last visit  Was smoking about half a pack a day  Past history of asthma  Breathing has been relatively stable He does have a history of neurofibromatosis, a lot of back pain and general discomfort  Exercise tolerance remains about the same, about a mile of walking  Worked in a kitchen environment in the past  History of hypertension. History of anxiety/depression, joint pain he is on Stiolto and albuterol as needed-has been tolerating inhalers well  He stated there was a debate in the past whether he had obstructive lung disease or not  Outpatient Encounter Medications as of 06/01/2022  Medication Sig   amLODipine (NORVASC) 5 MG tablet TAKE 1 TABLET (5 MG TOTAL) BY MOUTH DAILY.   buPROPion (WELLBUTRIN XL) 150 MG 24 hr tablet TAKE 1 TABLET BY MOUTH EVERY DAY   cyclobenzaprine (FLEXERIL) 5 MG tablet Take 1 tablet at bedtime as needed for 1 week, if tolerating you can take 1 tablet twice daily as needed.   folic acid (FOLVITE) 1 MG tablet TAKE 1 TABLET BY MOUTH EVERY DAY   gabapentin (NEURONTIN) 300 MG capsule Take 300 mg by mouth at bedtime.   ibuprofen (ADVIL) 600 MG tablet TAKE 1 TABLET BY MOUTH EVERY 8 HOURS AS NEEDED   naltrexone (DEPADE) 50 MG tablet Take 1 tablet (50 mg total) by mouth daily.   STIOLTO RESPIMAT 2.5-2.5 MCG/ACT AERS INHALE 2 PUFFS BY MOUTH INTO THE LUNGS DAILY   VENTOLIN HFA 108 (90 Base) MCG/ACT inhaler TAKE 2 PUFFS BY MOUTH EVERY 6 HOURS AS NEEDED FOR WHEEZE OR SHORTNESS OF BREATH   No facility-administered encounter medications on file as of 06/01/2022.    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   Neuromuscular disorder ([place]) 2014   neurofibrosis   Shortness of breath    with exertion   Vomiting blood     Past Surgical History:  Procedure Laterality Date   ESOPHAGOGASTRODUODENOSCOPY (EGD) WITH PROPOFOL  05/14/2012   Procedure: ESOPHAGOGASTRODUODENOSCOPY (EGD) WITH PROPOFOL;  Surgeon: Arta Silence, MD;  [facility];  Service: Endoscopy;  Laterality: N/A;   none     UPPER GASTROINTESTINAL ENDOSCOPY      Family History  Problem Relation Age of Onset   Diabetes Mother    Hypertension Mother    Colon cancer Father    Hypertension Father    Diabetes Father    Prostate cancer Father    Neurofibromatosis Brother        deceased   Neurofibromatosis  Daughter    Neurofibromatosis Daughter    Esophageal cancer Neg Hx    Rectal cancer Neg Hx    Inflammatory bowel disease Neg Hx    Liver disease Neg Hx    Pancreatic cancer Neg Hx    Stomach cancer Neg Hx    Colon polyps Neg Hx     Social History   Socioeconomic History   Marital status: Married    Spouse name: Roselyn Reef   Number of children: 3   Years of education: 11th   Highest education level: Not on file  Occupational History   Occupation: disablity  Tobacco Use   Smoking status: Former    Packs/day: 0.50    Years: 15.00    Total pack years: 7.50    Types: Cigarettes    Start date: 05/28/2002   Smokeless tobacco: Never   Tobacco comments:    tobacco infor given 04/01/18  Vaping Use   Vaping Use: Never  used  Substance and Sexual Activity   Alcohol use: Yes    Comment: on speciall occasion   Drug use: No   Sexual activity: Not on file  Other Topics Concern   Not on file  Social History Narrative   Unemployed. Recent patient of Antonietta Jewel, MD of [place] transferred to our practice after being in jail and losing insurance.       Lives in his parents home with his wife and 2 kids. Completed some High School.    Best contact [phone] and ok to leave a message.       Quit smoking early 7/13. Quit ETOH abuse early 203 after being told he had an upset stomach lining.       Started back smoking 05/2012. 3 cigarettes daily      Caffeine use: very little      Patient is left-handed. He lives with his wife and children in a one level home. He does not regularly exercise.   Social Determinants of Health   Financial Resource Strain: Not on file  Food Insecurity: Not on file  Transportation Needs: Not on file  Physical Activity: Not on file  Stress: Not on file  Social Connections: Not on file  Intimate Partner Violence: Not on file    Review of Systems  Respiratory:  Positive for shortness of breath.     Vitals:   06/01/22 0859  BP: 128/84  Pulse: 85  Temp: 98.2 F (36.8 C)  SpO2: 99%     Physical Exam Constitutional:      Appearance: Normal appearance.  HENT:     Head: Normocephalic.     Mouth/Throat:     Mouth: Mucous membranes are moist.  Eyes:     Pupils: Pupils are equal, round, and reactive to light.  Cardiovascular:     Rate and Rhythm: Normal rate and regular rhythm.     Heart sounds: No murmur heard.    No friction rub.  Pulmonary:     Effort: No respiratory distress.     Breath sounds: No stridor. No wheezing or rhonchi.  Musculoskeletal:     Cervical back: No rigidity or tenderness.  Neurological:     Mental Status: He is alert.  Psychiatric:        Mood and Affect: Mood normal.     Data Reviewed: Previous spirometry from 2018 did  not show obstruction  CT scan from 2017 is consistent with multiple bullae and emphysematous changes Possible fibroma left chest wall  Has  a CT scan of the chest scheduled for January 24, previous alpha-1 antitrypsin levels of 122 and 148 from 5 and 10 years ago respectively  Assessment:  COPD/emphysema -Has been relatively well -Managed to quit smoking  Shortness of breath on exertion -Remains relatively stable  Evaluate alpha-1 antitrypsin phenotype, levels have been normal in the past  Reformed smoker   Plan/Recommendations: PFT can be obtained at next visit in 6 months  Encourage graded exercise as tolerated  Encouraged to continue Stiolto and albuterol as needed  Follow-up CT scan of the chest scheduled for January 26  Encouraged to call with significant concerns  Follow-up in 6 months   Sherrilyn Rist MD [facility] 06/01/2022, 9:08 AM  CC: Zola Button, MD

## 2022-06-01 NOTE — Telephone Encounter (Signed)
Called patient back and he would like the Plenvu called into [facility] on [address]. Plenvu entered into system and sent to [facility].

## 2022-06-01 NOTE — Addendum Note (Signed)
Addended byOralia Rud M on: 06/01/2022 03:08 PM   Modules accepted: Orders

## 2022-06-01 NOTE — Patient Instructions (Signed)
I will see you for follow-up appointment in 6 months  It is great that you have quit smoking  Your alpha-1 antitrypsin level was normal about 5 years ago  We will check another level and phenotype-predisposition to alpha-1 deficiency  Go ahead with your CT scan, let us know if you do not hear about the results from [place] in a few days  We can have your breathing study done on the next day you come in

## 2022-06-06 ENCOUNTER — Telehealth: Payer: Self-pay | Admitting: Pulmonary Disease

## 2022-06-06 NOTE — Telephone Encounter (Signed)
Alpha-1 antitrypsin level of 120

## 2022-06-07 LAB — ALPHA-1 ANTITRYPSIN PHENOTYPE: A-1 Antitrypsin, Ser: 120 mg/dL (ref 83–199)

## 2022-06-13 ENCOUNTER — Encounter: Payer: Self-pay | Admitting: Gastroenterology

## 2022-06-13 ENCOUNTER — Ambulatory Visit (AMBULATORY_SURGERY_CENTER): Payer: Medicaid Other | Admitting: Gastroenterology

## 2022-06-13 VITALS — BP 149/100 | HR 71 | Temp 97.1°F | Resp 23 | Ht 67.0 in | Wt 149.6 lb

## 2022-06-13 DIAGNOSIS — D122 Benign neoplasm of ascending colon: Secondary | ICD-10-CM

## 2022-06-13 DIAGNOSIS — Z1211 Encounter for screening for malignant neoplasm of colon: Secondary | ICD-10-CM

## 2022-06-13 DIAGNOSIS — D127 Benign neoplasm of rectosigmoid junction: Secondary | ICD-10-CM

## 2022-06-13 DIAGNOSIS — K635 Polyp of colon: Secondary | ICD-10-CM

## 2022-06-13 MED ORDER — SODIUM CHLORIDE 0.9 % IV SOLN: 500.0000 mL | INTRAVENOUS | Status: DC

## 2022-06-13 NOTE — Progress Notes (Signed)
I have reviewed the patient's medical history in detail and updated the computerized patient record.

## 2022-06-13 NOTE — Progress Notes (Signed)
GASTROENTEROLOGY PROCEDURE H&P NOTE   Primary Care Physician: Zola Button, MD  HPI: James [name] is a 47 y.o. male who presents for Colonoscopy for screening.  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 90 tablet 1   buPROPion (WELLBUTRIN XL) 150 MG 24 hr tablet TAKE 1 TABLET BY MOUTH EVERY DAY 90 tablet 1   naltrexone (DEPADE) 50 MG tablet Take 1 tablet (50 mg total) by mouth daily. 30 tablet 2   STIOLTO RESPIMAT 2.5-2.5 MCG/ACT AERS INHALE 2 PUFFS BY MOUTH INTO THE LUNGS DAILY 4 g 3   VENTOLIN HFA 108 (90 Base) MCG/ACT inhaler TAKE 2 PUFFS BY MOUTH EVERY 6 HOURS AS NEEDED FOR WHEEZE OR SHORTNESS OF BREATH 18 each 2   cyclobenzaprine (FLEXERIL) 5 MG tablet Take 1 tablet at bedtime as needed for 1 week, if tolerating you can take 1 tablet twice daily as needed. 60 tablet 5   folic acid (FOLVITE) 1 MG tablet TAKE 1  TABLET BY MOUTH EVERY DAY (Patient not taking: Reported on 06/13/2022) 90 tablet 1   gabapentin (NEURONTIN) 300 MG capsule Take 300 mg by mouth at bedtime.     ibuprofen (ADVIL) 600 MG tablet TAKE 1 TABLET BY MOUTH EVERY 8 HOURS AS NEEDED 60 tablet 1   Current Facility-Administered Medications  Medication Dose Route Frequency Provider Last Rate Last Admin   0.9 %  sodium chloride infusion  500 mL Intravenous Continuous Mansouraty, Telford Nab., MD        Current Outpatient Medications:    amLODipine (NORVASC) 5 MG tablet, TAKE 1 TABLET (5 MG TOTAL) BY MOUTH DAILY., Disp: 90 tablet, Rfl: 1   buPROPion (WELLBUTRIN XL) 150 MG 24 hr tablet, TAKE 1 TABLET BY MOUTH EVERY DAY, Disp: 90 tablet, Rfl: 1   naltrexone (DEPADE) 50 MG tablet, Take 1 tablet (50 mg total) by mouth daily., Disp: 30 tablet, Rfl: 2   STIOLTO RESPIMAT 2.5-2.5 MCG/ACT AERS, INHALE 2 PUFFS BY MOUTH INTO THE LUNGS DAILY, Disp: 4 g, Rfl: 3   VENTOLIN HFA 108 (90 Base) MCG/ACT inhaler, TAKE 2 PUFFS BY MOUTH EVERY 6 HOURS AS NEEDED FOR WHEEZE OR SHORTNESS OF BREATH, Disp: 18 each, Rfl: 2   cyclobenzaprine (FLEXERIL) 5 MG tablet, Take 1 tablet at bedtime as needed for 1 week, if tolerating you can take 1  tablet twice daily as needed., Disp: 60 tablet, Rfl: 5   folic acid (FOLVITE) 1 MG tablet, TAKE 1 TABLET BY MOUTH EVERY DAY (Patient not taking: Reported on 06/13/2022), Disp: 90 tablet, Rfl: 1   gabapentin (NEURONTIN) 300 MG capsule, Take 300 mg by mouth at bedtime., Disp: , Rfl:    ibuprofen (ADVIL) 600 MG tablet, TAKE 1 TABLET BY MOUTH EVERY 8 HOURS AS NEEDED, Disp: 60 tablet, Rfl: 1  Current Facility-Administered Medications:    0.9 %  sodium chloride infusion, 500 mL, Intravenous, Continuous, Mansouraty, Telford Nab., MD No Known Allergies Family History  Problem Relation Age of Onset   Diabetes Mother    Hypertension Mother    Colon cancer Father    Hypertension Father    Diabetes Father    Prostate cancer Father     Neurofibromatosis Brother        deceased   Neurofibromatosis Daughter    Neurofibromatosis Daughter    Esophageal cancer Neg Hx    Rectal cancer Neg Hx    Inflammatory bowel disease Neg Hx    Liver disease Neg Hx    Pancreatic cancer Neg Hx    Stomach cancer Neg Hx    Colon polyps Neg Hx    Social History   Socioeconomic History   Marital status: Married    Spouse name: Roselyn Reef   Number of children: 3   Years of education: 11th   Highest education level: Not on file  Occupational History   Occupation: disablity  Tobacco Use   Smoking status: Former    Packs/day: 0.50    Years: 15.00    Total pack years: 7.50    Types: Cigarettes    Start date: 05/28/2002   Smokeless tobacco: Never   Tobacco comments:    tobacco infor given 04/01/18  Vaping Use   Vaping Use: Never used  Substance and Sexual Activity   Alcohol use: Yes    Comment: on special occasion   Drug use: No   Sexual activity: Not on file  Other Topics Concern   Not on file  Social History Narrative   Unemployed. Recent patient of Antonietta Jewel, MD of [place] transferred to our practice after being in jail and losing insurance.       Lives in his parents home with his wife and 2 kids. Completed some High School.    Best contact [phone] and ok to leave a message.       Quit smoking early 7/13. Quit ETOH abuse early 203 after being told he had an upset stomach lining.       Started back smoking 05/2012. 3 cigarettes daily      Caffeine use: very little      Patient is left-handed. He lives with his wife and children in a one level home. He does not regularly exercise.   Social Determinants of Health   Financial Resource Strain: Not on file  Food Insecurity: Not on file  Transportation Needs: Not on file  Physical Activity: Not on file  Stress: Not on file  Social Connections: Not on file  Intimate Partner Violence: Not on file    Physical Exam: Today's Vitals   06/13/22 0955  BP: 134/81   Pulse: 71  Temp: (!) 97.1 F (36.2 C)  TempSrc: Skin  SpO2: 98%  Weight: 149 lb 9.6 oz (67.9 kg)  Height: '5\' 7"'$  (1.702 m)   Body mass index is 23.43 kg/m. GEN: NAD EYE: Sclerae anicteric ENT:  MMM CV: Non-tachycardic GI: Soft, NT/ND NEURO:  Alert & Oriented x 3  Lab Results: No results for input(s): "WBC", "HGB", "HCT", "PLT" in the last 72 hours. BMET No results for input(s): "NA", "K", "CL", "CO2", "GLUCOSE", "BUN", "CREATININE", "CALCIUM" in the last 72 hours. LFT No results for input(s): "PROT", "ALBUMIN", "AST", "ALT", "ALKPHOS", "BILITOT", "BILIDIR", "IBILI" in the last 72 hours. PT/INR No results for input(s): "LABPROT", "INR" in the last 72 hours.   Impression / Plan: This is a 47 y.o.male who presents for Colonoscopy for screening.  The risks and benefits of endoscopic evaluation/treatment were discussed with the patient and/or family; these include but are not limited to the risk of perforation, infection, bleeding, missed lesions, lack of diagnosis, severe illness requiring hospitalization, as well as anesthesia and sedation related illnesses.  The patient's history has been reviewed, patient examined, no change in status, and deemed stable for procedure.  The patient and/or family is agreeable to proceed.    Justice Britain, MD [facility] Advanced Endoscopy Office # [phone]

## 2022-06-13 NOTE — Op Note (Signed)
[facility] Patient Name: James [name] Procedure Date: 06/13/2022 10:59 AM MRN: [id number] Endoscopist: Justice Britain , MD, [id number] Age: 47 Referring MD:  Date of Birth: 02/27/1976 Gender: Male Account #: [id number] Procedure:                Colonoscopy Indications:              Screening for colorectal malignant neoplasm Medicines:                Monitored Anesthesia Care Procedure:                Pre-Anesthesia Assessment:                           - Prior to the procedure, a History and Physical                            was performed, and patient medications and                            allergies were reviewed. The patient's tolerance of                            previous anesthesia was also reviewed. The risks                            and benefits of the procedure and the sedation                            options and risks were discussed with the patient.                            All questions were answered, and informed consent                            was obtained. Prior Anticoagulants: The patient has                            taken no anticoagulant or antiplatelet agents. ASA                            Grade Assessment: II - A patient with mild systemic                            disease. After reviewing the risks and benefits,                            the patient was deemed in satisfactory condition to                            undergo the procedure.                           After obtaining informed consent, the colonoscope  was passed under direct vision. Throughout the                            procedure, the patient's blood pressure, pulse, and                            oxygen saturations were monitored continuously. The                            Olympus CF-HQ190L ([id number]) Colonoscope was                            introduced through the anus and advanced to the the                            cecum,  identified by appendiceal orifice and                            ileocecal valve. The colonoscopy was performed                            without difficulty. The patient tolerated the                            procedure. The quality of the bowel preparation was                            adequate. The ileocecal valve, appendiceal orifice,                            and rectum were photographed. Scope In: 11:05:06 AM Scope Out: 54:09:81 AM Scope Withdrawal Time: 0 hours 9 minutes 45 seconds  Total Procedure Duration: 0 hours 13 minutes 5 seconds  Findings:                 The digital rectal exam findings include                            hemorrhoids. Pertinent negatives include no                            palpable rectal lesions.                           Two sessile polyps were found in the recto-sigmoid                            colon and ascending colon. The polyps were 2 to 5                            mm in size. These polyps were removed with a cold                            snare. Resection and retrieval were complete.  Multiple small-mouthed diverticula were found in                            the entire colon.                           Normal mucosa was found in the entire colon                            otherwise.                           Non-bleeding non-thrombosed internal hemorrhoids                            were found during retroflexion, during perianal                            exam and during digital exam. The hemorrhoids were                            Grade II (internal hemorrhoids that prolapse but                            reduce spontaneously). Complications:            No immediate complications. Estimated Blood Loss:     Estimated blood loss was minimal. Impression:               - Hemorrhoids found on digital rectal exam.                           - Two 2 to 5 mm polyps at the recto-sigmoid colon                             and in the ascending colon, removed with a cold                            snare. Resected and retrieved.                           - Diverticulosis in the entire examined colon.                           - Normal mucosa in the entire examined colon                            otherwise.                           - Non-bleeding non-thrombosed internal hemorrhoids. Recommendation:           - The patient will be observed post-procedure,                            until all discharge criteria are met.                           -  Discharge patient to home.                           - Patient has a contact number available for                            emergencies. The signs and symptoms of potential                            delayed complications were discussed with the                            patient. Return to normal activities tomorrow.                            Written discharge instructions were provided to the                            patient.                           - High fiber diet.                           - Use FiberCon 1-2 tablets PO daily.                           - Continue present medications.                           - Await pathology results.                           - Repeat colonoscopy in 10/01/08 years for                            surveillance based on pathology results and                            findings of adenomatous tissue.                           - The findings and recommendations were discussed                            with the patient.                           - The findings and recommendations were discussed                            with the patient's family. Justice Britain, MD 06/13/2022 11:22:31 AM

## 2022-06-13 NOTE — Patient Instructions (Signed)
Handouts provided on polyps, diverticulosis, hemorrhoids and high fiber diet.   Recommend a high-fiber diet (see handout). Use FiberCon 1-2 tablets by mouth daily.  Continue present medications.  Await pathology results.  Repeat colonoscopy in 10/01/08 years for surveillance based on pathology results and findings of adenomatous tissue.   YOU HAD AN ENDOSCOPIC PROCEDURE TODAY AT [facility]:   Refer to the procedure report that was given to you for any specific questions about what was found during the examination.  If the procedure report does not answer your questions, please call your gastroenterologist to clarify.  If you requested that your care partner not be given the details of your procedure findings, then the procedure report has been included in a sealed envelope for you to review at your convenience later.  YOU SHOULD EXPECT: Some feelings of bloating in the abdomen. Passage of more gas than usual.  Walking can help get rid of the air that was put into your GI tract during the procedure and reduce the bloating. If you had a lower endoscopy (such as a colonoscopy or flexible sigmoidoscopy) you may notice spotting of blood in your stool or on the toilet paper. If you underwent a bowel prep for your procedure, you may not have a normal bowel movement for a few days.  Please Note:  You might notice some irritation and congestion in your nose or some drainage.  This is from the oxygen used during your procedure.  There is no need for concern and it should clear up in a day or so.  SYMPTOMS TO REPORT IMMEDIATELY:  Following lower endoscopy (colonoscopy or flexible sigmoidoscopy):  Excessive amounts of blood in the stool  Significant tenderness or worsening of abdominal pains  Swelling of the abdomen that is new, acute  Fever of 100F or higher  For urgent or emergent issues, a gastroenterologist can be reached at any hour by calling [phone]. Do not use MyChart  messaging for urgent concerns.    DIET:  We do recommend a small meal at first, but then you may proceed to your regular diet.  Drink plenty of fluids but you should avoid alcoholic beverages for 24 hours.  ACTIVITY:  You should plan to take it easy for the rest of today and you should NOT DRIVE or use heavy machinery until tomorrow (because of the sedation medicines used during the test).    FOLLOW UP: Our staff will call the number listed on your records the next business day following your procedure.  We will call around 7:15- 8:00 am to check on you and address any questions or concerns that you may have regarding the information given to you following your procedure. If we do not reach you, we will leave a message.     If any biopsies were taken you will be contacted by phone or by letter within the next 1-3 weeks.  Please call us at [phone] if you have not heard about the biopsies in 3 weeks.    SIGNATURES/CONFIDENTIALITY: You and/or your care partner have signed paperwork which will be entered into your electronic medical record.  These signatures attest to the fact that that the information above on your After Visit Summary has been reviewed and is understood.  Full responsibility of the confidentiality of this discharge information lies with you and/or your care-partner.

## 2022-06-13 NOTE — Progress Notes (Signed)
Called to room to assist during endoscopic procedure.  Patient ID and intended procedure confirmed with present staff. Received instructions for my participation in the procedure from the performing physician.  

## 2022-06-13 NOTE — Progress Notes (Signed)
To pacu, VSS. Report to [email or web address] 

## 2022-06-14 ENCOUNTER — Telehealth: Payer: Self-pay

## 2022-06-14 NOTE — Telephone Encounter (Signed)
  Follow up Call-     06/13/2022    9:21 AM  Call back number  Post procedure Call Back phone  # [phone]  Permission to leave phone message Yes     Patient questions:  Do you have a fever, pain , or abdominal swelling? No. Pain Score  0 *  Have you tolerated food without any problems? Yes.    Have you been able to return to your normal activities? Yes.    Do you have any questions about your discharge instructions: Diet   No. Medications  No. Follow up visit  No.  Do you have questions or concerns about your Care? No.  Actions: * If pain score is 4 or above: No action needed, pain <4.

## 2022-06-16 ENCOUNTER — Encounter: Payer: Self-pay | Admitting: Gastroenterology

## 2022-06-22 ENCOUNTER — Ambulatory Visit
Admission: RE | Admit: 2022-06-22 | Discharge: 2022-06-22 | Disposition: A | Discharge status: Home / Self Care | Payer: Medicaid Other | Source: Ambulatory Visit | Attending: Pulmonary Disease | Admitting: Pulmonary Disease

## 2022-06-22 DIAGNOSIS — R9389 Abnormal findings on diagnostic imaging of other specified body structures: Secondary | ICD-10-CM

## 2022-08-27 ENCOUNTER — Ambulatory Visit: Payer: Medicaid Other | Admitting: Neurology

## 2022-08-27 ENCOUNTER — Encounter: Payer: Self-pay | Admitting: Neurology

## 2022-08-27 VITALS — BP 158/104 | HR 101 | Ht 67.0 in | Wt 161.0 lb

## 2022-08-27 DIAGNOSIS — D3614 Benign neoplasm of peripheral nerves and autonomic nervous system of thorax: Secondary | ICD-10-CM

## 2022-08-27 DIAGNOSIS — Q8501 Neurofibromatosis, type 1: Secondary | ICD-10-CM | POA: Diagnosis not present

## 2022-08-27 MED ORDER — CYCLOBENZAPRINE HCL 5 MG PO TABS: ORAL_TABLET | ORAL | 5 refills | Status: DC

## 2022-08-27 MED ORDER — GABAPENTIN 300 MG PO CAPS: 300.0000 mg | ORAL_CAPSULE | Freq: Two times a day (BID) | ORAL | 3 refills | Status: DC

## 2022-08-27 NOTE — Progress Notes (Signed)
Follow-up Visit   Date: 08/27/22   James [name] MRN: [id number] DOB: 07-25-1975   Interim History: James [name] is a 47 y.o. left-handed male with COPD, GERD, alcohol abuse, and tobacco use returning to the clinic for follow-up of neurofibromatosis type 1.  The patient was accompanied to the clinic by self.  IMPRESSION/PLAN: Neurofibromatosis type 1 with cutaneous neurofibromas, cafe au lait spots, and neurofibromas involving the C8, T1, T12, and T3 nerve roots.  Stable on imaging from 2023. Today, he reports ongoing right arm paresthesias.  - Increase gabapentin to 300mg  twice daily  - Recommend annual eye exam  Chronic low back pain  - Continue flexeril 5mg  BID as needed  3.   History of alcohol abuse, sober since February 2024.   - Praised patient for abstaining from alcohol and encouraged him to continue to do so  4.   Elevated blood pressure, asymptomatic.  Continue to monitor and follow-up with PCP, if it remains elevated.   Return to clinic in 1 year  ---------------------------------------------------------- History of present illness:  He was diagnosed with NF1 after evaluation for chest pain led to MRI thoracic spine showing neurofibromas involving the T2 and III nerve roots on the left. He has chronic sharp pain related to do, as if being punched.  He takes takes ibuprofen almost daily which provides intermittent relief. He intermittent numbness/tingling of the hands.   Over the past several years, there has been no new change to his back pain.  He denies any new vision changes, numbness or tingling, or weakness.  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  He has not been able to establish care with dermatology ophthalmology locally due to being on Medicaid.  He was previously evaluated by Dr. Leta Baptist in 2014 for neurofibromatosis type I.  At that time, he had new onset headaches and  retro-orbital eye pain.  MRI of the brain and orbit was normal, specifically no evidence of optic glioma.  Patient has strong family history of neurofibromatosis including his mother, brother (deceased) and 2 daughters. He has one son that has unaffected.    He was drinking a gallon liquor daily for 5 years, and cut back in January 2020 down to 2 beers daily.  He does not work and has been on disability since 2015 because of falls related to NF1.  He has been sober since 2022.  UPDATE 08/23/2021:    He is here for 1 year follow-up visit.  He continues to have achy low back pain and stiffness.  MRI lumbar spine from 2022 did not show any structural disease of the lumbar spine.  MRI thoracic spine showed stable neurofibromas at C8, T1, T2, and T3 nerve roots.  He denies shooting back pain or radiating pain down the legs.  No new visual complaints, numbness/tingling or weakness.   UPDATE 08/27/2022:  He is here for 1 year follow-up visit.  He reports having ongoing right arm burning/tingling pain which has been the same for the past several years.  No new neurological complaints, such as weakness or new paresthesias.  No interval falls or hospitalizations.  He has been sober for the past 2 months, due to pressure from his mother and children, two of which also have NF.   Medications:  Current Outpatient Medications on File Prior to Visit  Medication Sig Dispense Refill   amLODipine (NORVASC) 5 MG tablet TAKE 1 TABLET (5  MG TOTAL) BY MOUTH DAILY. 90 tablet 1   buPROPion (WELLBUTRIN XL) 150 MG 24 hr tablet TAKE 1 TABLET BY MOUTH EVERY DAY 90 tablet 1   cyclobenzaprine (FLEXERIL) 5 MG tablet Take 1 tablet at bedtime as needed for 1 week, if tolerating you can take 1 tablet twice daily as needed. 60 tablet 5   gabapentin (NEURONTIN) 300 MG capsule Take 300 mg by mouth at bedtime.     ibuprofen (ADVIL) 600 MG tablet TAKE 1 TABLET BY MOUTH EVERY 8 HOURS AS NEEDED 60 tablet 1   STIOLTO RESPIMAT 2.5-2.5 MCG/ACT  AERS INHALE 2 PUFFS BY MOUTH INTO THE LUNGS DAILY 4 g 3   VENTOLIN HFA 108 (90 Base) MCG/ACT inhaler TAKE 2 PUFFS BY MOUTH EVERY 6 HOURS AS NEEDED FOR WHEEZE OR SHORTNESS OF BREATH 18 each 2   No current facility-administered medications on file prior to visit.    Allergies: No Known Allergies  Vital Signs:  BP (!) 160/107   Pulse (!) 101   Ht 5\' 7"  (1.702 m)   Wt 161 lb (73 kg)   SpO2 94%   BMI 25.22 kg/m   Neurological Exam: MENTAL STATUS including orientation to time, place, person, recent and remote memory, attention span and concentration, language, and fund of knowledge is normal.  Speech is not dysarthric.  CRANIAL NERVES:   Pupils equal round and reactive to light.  Normal conjugate, extra-ocular eye movements in all directions of gaze.  No ptosis.  Face is symmetric. Palate elevates symmetrically.  Tongue is midline.  MOTOR:  Motor strength is 5/5 in all extremities.  No atrophy, fasciculations or abnormal movements.  No pronator drift.  Tone is normal.    MSRs:  Reflexes are 2+/4 throughout.  SENSORY:  Intact to vibration throughout.  COORDINATION/GAIT:  Normal finger-to- nose-finger.  Intact rapid alternating movements bilaterally.  Gait appears, stable, unassisted. Mild unsteadiness with tandem gait, but able to perform.   Data: MRI thoracic and lumbar spine wwo contrast 1/28/20220: 1. Stable neurofibromas at the T2 and T3 nerve roots on the left and at C8 and T1 on the right. 2. Stable vertebral body erosions at T2 and T3. 3. Stable mild degenerative changes of the lumbar spine without significant spinal canal or neural foraminal stenosis. 4. Stable expansile left iliac wing lesion, probably benign.    Thank you for allowing me to participate in patient's care.  If I can answer any additional questions, I would be pleased to do so.    Sincerely,    James Kiss K. Posey Pronto, DO

## 2022-08-27 NOTE — Patient Instructions (Addendum)
Increase gabapentin 300mg  twice daily  Proud of you for abstaining from alcohol  I will see you back in 1 year

## 2022-09-11 ENCOUNTER — Other Ambulatory Visit: Payer: Self-pay | Admitting: Family Medicine

## 2022-10-21 ENCOUNTER — Other Ambulatory Visit: Payer: Self-pay | Admitting: Family Medicine

## 2022-11-08 ENCOUNTER — Encounter (HOSPITAL_COMMUNITY): Payer: Self-pay | Admitting: Emergency Medicine

## 2022-11-08 ENCOUNTER — Emergency Department (HOSPITAL_COMMUNITY): Payer: Medicaid Other

## 2022-11-08 ENCOUNTER — Emergency Department (HOSPITAL_COMMUNITY)
Admission: EM | Admit: 2022-11-08 | Discharge: 2022-11-08 | Disposition: A | Discharge status: Home / Self Care | Payer: Medicaid Other | Attending: Emergency Medicine | Admitting: Emergency Medicine

## 2022-11-08 ENCOUNTER — Other Ambulatory Visit: Payer: Self-pay

## 2022-11-08 DIAGNOSIS — R112 Nausea with vomiting, unspecified: Secondary | ICD-10-CM | POA: Diagnosis not present

## 2022-11-08 DIAGNOSIS — R079 Chest pain, unspecified: Secondary | ICD-10-CM | POA: Diagnosis present

## 2022-11-08 DIAGNOSIS — J449 Chronic obstructive pulmonary disease, unspecified: Secondary | ICD-10-CM | POA: Diagnosis not present

## 2022-11-08 DIAGNOSIS — I10 Essential (primary) hypertension: Secondary | ICD-10-CM | POA: Insufficient documentation

## 2022-11-08 DIAGNOSIS — Z79899 Other long term (current) drug therapy: Secondary | ICD-10-CM | POA: Diagnosis not present

## 2022-11-08 LAB — I-STAT CHEM 8, ED
BUN: <3 mg/dL — ABNORMAL LOW (ref 6–20)
Calcium, Ion: 1.02 mmol/L — ABNORMAL LOW (ref 1.15–1.40)
Chloride: 103 mmol/L (ref 98–111)
Creatinine, Ser: 0.8 mg/dL (ref 0.61–1.24)
Glucose, Bld: 110 mg/dL — ABNORMAL HIGH (ref 70–99)
HCT: 39 % (ref 39.0–52.0)
Hemoglobin: 13.3 g/dL (ref 13.0–17.0)
Potassium: 3.6 mmol/L (ref 3.5–5.1)
Sodium: 136 mmol/L (ref 135–145)
TCO2: 22 mmol/L (ref 22–32)

## 2022-11-08 LAB — COMPREHENSIVE METABOLIC PANEL
ALT: 31 U/L (ref 0–44)
AST: 41 U/L (ref 15–41)
Albumin: 4.2 g/dL (ref 3.5–5.0)
Alkaline Phosphatase: 89 U/L (ref 38–126)
Anion gap: 21 — ABNORMAL HIGH (ref 5–15)
BUN: 5 mg/dL — ABNORMAL LOW (ref 6–20)
CO2: 16 mmol/L — ABNORMAL LOW (ref 22–32)
Calcium: 8.7 mg/dL — ABNORMAL LOW (ref 8.9–10.3)
Chloride: 99 mmol/L (ref 98–111)
Creatinine, Ser: 0.92 mg/dL (ref 0.61–1.24)
GFR, Estimated: >60 mL/min (ref >60)
Glucose, Bld: 62 mg/dL — ABNORMAL LOW (ref 70–99)
Potassium: 3.6 mmol/L (ref 3.5–5.1)
Sodium: 136 mmol/L (ref 135–145)
Total Bilirubin: 1.2 mg/dL (ref 0.3–1.2)
Total Protein: 7.1 g/dL (ref 6.5–8.1)

## 2022-11-08 LAB — CBC WITH DIFFERENTIAL/PLATELET
Abs Immature Granulocytes: 0.06 10*3/uL (ref 0.00–0.07)
Basophils Absolute: 0.1 10*3/uL (ref 0.0–0.1)
Basophils Relative: 1 %
Eosinophils Absolute: 0.1 10*3/uL (ref 0.0–0.5)
Eosinophils Relative: 1 %
HCT: 40.7 % (ref 39.0–52.0)
Hemoglobin: 13.6 g/dL (ref 13.0–17.0)
Immature Granulocytes: 1 %
Lymphocytes Relative: 14 %
Lymphs Abs: 1.3 10*3/uL (ref 0.7–4.0)
MCH: 30.6 pg (ref 26.0–34.0)
MCHC: 33.4 g/dL (ref 30.0–36.0)
MCV: 91.7 fL (ref 80.0–100.0)
Monocytes Absolute: 0.6 10*3/uL (ref 0.1–1.0)
Monocytes Relative: 6 %
Neutro Abs: 7.1 10*3/uL (ref 1.7–7.7)
Neutrophils Relative %: 77 %
Platelets: 238 10*3/uL (ref 150–400)
RBC: 4.44 MIL/uL (ref 4.22–5.81)
RDW: 12.6 % (ref 11.5–15.5)
WBC: 9.3 10*3/uL (ref 4.0–10.5)
nRBC: 0 % (ref 0.0–0.2)

## 2022-11-08 LAB — CBG MONITORING, ED
Glucose-Capillary: 69 mg/dL — ABNORMAL LOW (ref 70–99)
Glucose-Capillary: 113 mg/dL — ABNORMAL HIGH (ref 70–99)

## 2022-11-08 LAB — TROPONIN I (HIGH SENSITIVITY)
Troponin I (High Sensitivity): 3 ng/L (ref <18)
Troponin I (High Sensitivity): 3 ng/L (ref <18)

## 2022-11-08 LAB — LIPASE, BLOOD: Lipase: 24 U/L (ref 11–51)

## 2022-11-08 MED ORDER — ALUM & MAG HYDROXIDE-SIMETH 200-200-20 MG/5ML PO SUSP
30.0000 mL | Freq: Once | ORAL | Status: AC
  Administered 2022-11-08: 30 mL via ORAL
  Filled 2022-11-08: qty 30

## 2022-11-08 MED ORDER — FAMOTIDINE 20 MG PO TABS: 20.0000 mg | ORAL_TABLET | Freq: Two times a day (BID) | ORAL | 0 refills | Status: AC

## 2022-11-08 MED ORDER — ONDANSETRON HCL 4 MG/2ML IJ SOLN
4.0000 mg | Freq: Once | INTRAMUSCULAR | Status: AC
  Administered 2022-11-08: 4 mg via INTRAVENOUS
  Filled 2022-11-08: qty 2

## 2022-11-08 MED ORDER — FAMOTIDINE IN NACL 20-0.9 MG/50ML-% IV SOLN
20.0000 mg | Freq: Once | INTRAVENOUS | Status: AC
  Administered 2022-11-08: 20 mg via INTRAVENOUS
  Filled 2022-11-08: qty 50

## 2022-11-08 MED ORDER — SODIUM CHLORIDE 0.9 % IV BOLUS
500.0000 mL | Freq: Once | INTRAVENOUS | Status: AC
  Administered 2022-11-08: 500 mL via INTRAVENOUS

## 2022-11-08 MED ORDER — ONDANSETRON HCL 4 MG PO TABS: 4.0000 mg | ORAL_TABLET | Freq: Four times a day (QID) | ORAL | 0 refills | Status: DC

## 2022-11-08 MED ORDER — SODIUM CHLORIDE 0.9 % IV BOLUS
1000.0000 mL | Freq: Once | INTRAVENOUS | Status: AC
  Administered 2022-11-08: 1000 mL via INTRAVENOUS

## 2022-11-08 NOTE — Discharge Instructions (Signed)
To help prevent further vomiting please take Zofran every 6 hours for the next 48 hours then you can take this medication as needed.  Take it as soon as you begin to feel nauseous and do not wait until you started vomiting.  This medicine works much better when it is taken before vomiting begins.  Take Pepcid twice daily to help reduce irritation of your esophagus from the vomiting.  Eat small frequent meals over the next week rather than larger meals since your stomach has had very little on it this will help prevent vomiting.  Be sure to eat bland foods and ensure you are drinking plenty of fluids.  If you continue to have persistent vomiting please return to the emergency department.

## 2022-11-08 NOTE — ED Provider Notes (Signed)
  Physical Exam  BP (!) 150/94   Pulse 99   Temp 98 F (36.7 C) (Oral)   Resp 19   Ht 5\' 7"  (1.702 m)   Wt 70.3 kg   SpO2 98%   BMI 24.28 kg/m   Physical Exam Vitals and nursing note reviewed.  Constitutional:      Appearance: He is well-developed.     Comments: Playing on cell in no distress.   HENT:     Head: Normocephalic and atraumatic.  Cardiovascular:     Rate and Rhythm: Normal rate.  Pulmonary:     Effort: Pulmonary effort is normal.     Comments: No signs of tachypnea.  Chest:     Chest wall: No tenderness.  Abdominal:     Palpations: Abdomen is soft.  Musculoskeletal:     Right lower leg: No edema.     Left lower leg: No edema.  Skin:    General: Skin is warm and dry.  Neurological:     Mental Status: He is alert.     Procedures  Procedures  ED Course / MDM   Clinical Course as of 11/08/22 1625  Thu Nov 08, 2022  1616 Anion gap(!): 21 [JS]    Clinical Course User Index [JS] Claude Manges, PA-C   Medical Decision Making Amount and/or Complexity of Data Reviewed Labs: ordered. Decision-making details documented in ED Course. Radiology: ordered.  Risk OTC drugs. Prescription drug management.   Patient care assumed from [place]. PA at shift change, please see her note for a full HPI. Briefly, patient here with chest pain which began at 630 today, also endorsing nausea and vomiting.  Patient with a reassuring cardiac workup, however does have acidosis on blood work. Plan is for follow up chem 8 and dispo home.   4:45 PM Patient reassessed by me, feels improvement in his symptoms. I STAT Chem 8 with a calculated gap of 11. He will be dispo home for outpatient follow up. Stable for discharge.     Portions of this note were generated with Scientist, clinical (histocompatibility and immunogenetics). Dictation errors may occur despite best attempts at proofreading.         Claude Manges, PA-C 11/08/22 1647    Gwyneth Sprout, MD 11/08/22 [phone]

## 2022-11-08 NOTE — Progress Notes (Signed)
Visited with patient. He said he was here because he was experiencing chest pain and couldn't keep anything down in stomach.  Chaplain provided listening,spiritual and emotional support.  Venida Jarvis, [place], [facility], Pager [phone]

## 2022-11-08 NOTE — ED Triage Notes (Signed)
Pt BIB [facility] from home due to chest pain that started today at 0630.  Pt has been vomiting which makes the chest pain worse.  Pt has not been able to keep a full meal down the last week.  Pt given 4mg  Zofran and NS.  18g right AC.  VS BP 138/78, HR 95 [id number]

## 2022-11-08 NOTE — ED Provider Notes (Signed)
[place] EMERGENCY DEPARTMENT AT [facility] Provider Note   CSN: [id number] Arrival date & time: 11/08/22  1041     History  Chief Complaint  Patient presents with   Chest Pain    James [name] is a 47 y.o. male.  James [name] is a 47 y.o. male with a history of hypertension, COPD, GERD, alcohol use disorder (sober since 2022), and neurofibromatosis type I, who presents to the emergency department for evaluation of chest pain, nausea and vomiting.  Patient reports that he has been having persistent vomiting for about a week and has been unable to keep anything down.  He reports chest pain started this morning around 6:30 after an episode of vomiting.  He reports vomiting seems to make chest pain worse.  He describes chest pain as a burning discomfort.  He is unsure what has caused with nausea and vomiting but he has had a lot of difficulty keeping down any food or drink.  Denies associated abdominal pain.  No blood in his emesis.  He denies any blood in his stool or dark black stool, has been having normal bowel movements.  No associated shortness of breath.  The history is provided by the patient.  Chest Pain Associated symptoms: nausea and vomiting   Associated symptoms: no abdominal pain, no cough, no fever and no shortness of breath        Home Medications Prior to Admission medications   Medication Sig Start Date End Date Taking? Authorizing Provider  amLODipine (NORVASC) 5 MG tablet TAKE 1 TABLET (5 MG TOTAL) BY MOUTH DAILY. 10/23/22   Littie Deeds, MD  buPROPion (WELLBUTRIN XL) 150 MG 24 hr tablet TAKE 1 TABLET BY MOUTH EVERY DAY 01/26/22   Littie Deeds, MD  cyclobenzaprine (FLEXERIL) 5 MG tablet Take 1 tablet at bedtime as needed for 1 week, if tolerating you can take 1 tablet twice daily as needed. 08/27/22   Patel, Roxana Hires K, DO  gabapentin (NEURONTIN) 300 MG capsule Take 1 capsule (300 mg total) by mouth 2 (two) times daily. 08/27/22   Patel, Roxana Hires K, DO   ibuprofen (ADVIL) 600 MG tablet TAKE 1 TABLET BY MOUTH EVERY 8 HOURS AS NEEDED 01/26/22   Littie Deeds, MD  STIOLTO RESPIMAT 2.5-2.5 MCG/ACT AERS INHALE 2 PUFFS BY MOUTH INTO THE LUNGS DAILY 10/11/21   Littie Deeds, MD  VENTOLIN HFA 108 (90 Base) MCG/ACT inhaler TAKE 2 PUFFS BY MOUTH EVERY 6 HOURS AS NEEDED FOR WHEEZE OR SHORTNESS OF BREATH 09/12/22   Littie Deeds, MD      Allergies    Patient has no known allergies.    Review of Systems   Review of Systems  Constitutional:  Negative for chills and fever.  HENT: Negative.    Respiratory:  Negative for cough and shortness of breath.   Cardiovascular:  Positive for chest pain.  Gastrointestinal:  Positive for nausea and vomiting. Negative for abdominal pain, blood in stool, constipation and diarrhea.    Physical Exam Updated Vital Signs BP (!) 142/89 (BP Location: Left Arm)   Pulse 98   Temp 98.1 F (36.7 C) (Oral)   Resp 20   Ht 5\' 7"  (1.702 m)   Wt 70.3 kg   SpO2 99%   BMI 24.28 kg/m  Physical Exam Vitals and nursing note reviewed.  Constitutional:      General: He is not in acute distress.    Appearance: Normal appearance. He is well-developed and normal weight. He is not ill-appearing  or diaphoretic.  HENT:     Head: Normocephalic and atraumatic.     Mouth/Throat:     Mouth: Mucous membranes are moist.     Pharynx: Oropharynx is clear.  Eyes:     General:        Right eye: No discharge.        Left eye: No discharge.     Pupils: Pupils are equal, round, and reactive to light.  Cardiovascular:     Rate and Rhythm: Normal rate and regular rhythm.     Pulses: Normal pulses.          Radial pulses are 2+ on the right side and 2+ on the left side.     Heart sounds: Normal heart sounds. No murmur heard.    No friction rub. No gallop.  Pulmonary:     Effort: Pulmonary effort is normal. No respiratory distress.     Breath sounds: Normal breath sounds. No wheezing or rales.     Comments: Respirations equal and unlabored,  patient able to speak in full sentences, lungs clear to auscultation bilaterally  Chest:     Chest wall: No tenderness.  Abdominal:     General: Bowel sounds are normal. There is no distension.     Palpations: Abdomen is soft. There is no mass.     Tenderness: There is no abdominal tenderness. There is no guarding.     Comments: Abdomen soft, nondistended, nontender to palpation in all quadrants without guarding or peritoneal signs  Musculoskeletal:        General: No deformity.     Cervical back: Neck supple.  Skin:    General: Skin is warm and dry.     Capillary Refill: Capillary refill takes less than 2 seconds.  Neurological:     Mental Status: He is alert and oriented to person, place, and time.     Coordination: Coordination normal.     Comments: Speech is clear, able to follow commands Moves extremities without ataxia, coordination intact  Psychiatric:        Mood and Affect: Mood normal.        Behavior: Behavior normal.     ED Results / Procedures / Treatments   Labs (all labs ordered are listed, but only abnormal results are displayed) Labs Reviewed  COMPREHENSIVE METABOLIC PANEL - Abnormal; Notable for the following components:      Result Value   CO2 16 (*)    Glucose, Bld 62 (*)    BUN 5 (*)    Calcium 8.7 (*)    Anion gap 21 (*)    All other components within normal limits  CBG MONITORING, ED - Abnormal; Notable for the following components:   Glucose-Capillary 69 (*)    All other components within normal limits  CBG MONITORING, ED - Abnormal; Notable for the following components:   Glucose-Capillary 113 (*)    All other components within normal limits  I-STAT CHEM 8, ED - Abnormal; Notable for the following components:   BUN <3 (*)    Glucose, Bld 110 (*)    Calcium, Ion 1.02 (*)    All other components within normal limits  LIPASE, BLOOD  CBC WITH DIFFERENTIAL/PLATELET  TROPONIN I (HIGH SENSITIVITY)  TROPONIN I (HIGH SENSITIVITY)    EKG EKG  Interpretation  Date/Time:  Thursday November 08 2022 10:44:29 EDT Ventricular Rate:  101 PR Interval:  148 QRS Duration: 81 QT Interval:  340 QTC Calculation: 441 R Axis:   75  Text Interpretation: Sinus tachycardia No significant change since last tracing Confirmed by Susy Frizzle [phone]) on 11/09/2022 8:22:25 AM  Radiology No results found.  Procedures Procedures    Medications Ordered in ED Medications  sodium chloride 0.9 % bolus 500 mL (0 mLs Intravenous Stopped 11/08/22 1227)  famotidine (PEPCID) IVPB 20 mg premix (0 mg Intravenous Stopped 11/08/22 1227)  alum & mag hydroxide-simeth (MAALOX/MYLANTA) 200-200-20 MG/5ML suspension 30 mL (30 mLs Oral Given 11/08/22 1120)  sodium chloride 0.9 % bolus 1,000 mL (0 mLs Intravenous Stopped 11/08/22 1533)  ondansetron (ZOFRAN) injection 4 mg (4 mg Intravenous Given 11/08/22 1408)    ED Course/ Medical Decision Making/ A&P Clinical Course as of 11/22/22 0825  Thu Nov 08, 2022  1616 Anion gap(!): 21 [JS]    Clinical Course User Index [JS] Claude Manges, PA-C                             Medical Decision Making Amount and/or Complexity of Data Reviewed Labs: ordered. Decision-making details documented in ED Course. Radiology: ordered.  Risk OTC drugs. Prescription drug management.   47 y.o. male presents to the ED with complaints of chest pain and persistent vomiting, this involves an extensive number of treatment options, and is a complaint that carries with it a high risk of complications and morbidity.  The differential diagnosis includes cyclical vomiting, gastritis, esophagitis, esophageal spasm, ACS, peptic ulcer disease, pancreatitis, cholecystitis.  On arrival pt is nontoxic, vitals overall normal, mildly elevated blood pressure.   Additional history obtained from chart review.   I ordered medication IV fluid bolus, Zofran, Pepcid and Maalox for symptom management.  Lab Tests:  I Ordered, reviewed, and interpreted  labs, which included: No leukocytosis and normal hemoglobin, CO2 of 16 and anion gap of 21 which I suspect is in the setting of recurrent emesis, glucose of 62 likely in the setting of decreased oral intake, normal sodium and chloride and potassium fortunately and normal renal and liver function.  Lipase is normal.  Troponins negative x 2.  Will recheck Chem-8 and CBG after additional IV hydration and oral intake.  Imaging Studies ordered:  I ordered imaging studies which included acute abdominal series, I independently visualized and interpreted imaging which showed no acute findings, pleural-based mass as seen on prior imaging which is suspected to be a benign   ED Course:   After medications in the ED patient is tolerating p.o. fluids and food.  Patient will be given additional IV hydration and then Chem-8 will be rechecked.  At shift change care signed out to PA [facility] who will follow-up on repeat Chem-8 and recheck of CBG.  As long as anion gap and CO2 are improving with rehydration anticipate patient can go home.  Patient has been given strict instructions about how to use nausea medication to hopefully resolve his ongoing nausea and vomiting.  Portions of this note were generated with Scientist, clinical (histocompatibility and immunogenetics). Dictation errors may occur despite best attempts at proofreading.         Final Clinical Impression(s) / ED Diagnoses Final diagnoses:  Nausea and vomiting, unspecified vomiting type  Central chest pain    Rx / DC Orders ED Discharge Orders          Ordered    ondansetron (ZOFRAN) 4 MG tablet  Every 6 hours        11/08/22 1527    famotidine (PEPCID) 20 MG tablet  2 times  daily        11/08/22 1527              Dartha Lodge, [place] 11/22/22 1610    Arby Barrette, MD 11/27/22 [phone]

## 2022-11-08 NOTE — ED Notes (Addendum)
EDP Ford notified of hypoglycemia; pt alert and oriented; pt given food and beverage

## 2022-11-18 ENCOUNTER — Other Ambulatory Visit: Payer: Self-pay | Admitting: Family Medicine

## 2022-12-20 ENCOUNTER — Telehealth: Payer: Self-pay | Admitting: Neurology

## 2022-12-20 DIAGNOSIS — D3614 Benign neoplasm of peripheral nerves and autonomic nervous system of thorax: Secondary | ICD-10-CM

## 2022-12-20 DIAGNOSIS — Q8501 Neurofibromatosis, type 1: Secondary | ICD-10-CM

## 2022-12-20 NOTE — Telephone Encounter (Signed)
Patient's [facility] [phone].   Called to ask if we could get patient in soon, he is experiencing new symptoms.

## 2022-12-21 MED ORDER — GABAPENTIN 300 MG PO CAPS: ORAL_CAPSULE | ORAL | 3 refills | Status: DC

## 2022-12-21 NOTE — Telephone Encounter (Signed)
Called patient and informed him that Dr. Peterson Lombard would like to get updated MRI to be sure there are no new neuromas.  For his pain, there is room to increase gabapentin - Dr. Allena Katz recommends increasing gabapentin to 300mg  in the morning and 600mg  at bedtime x 1 week, then 600mg  twice daily. Updated Rx has been sent for patient.   Patient verbalized understanding and is agreeable to have MRI's done. He is aware [facility] will contact him once PA's are approved.

## 2022-12-21 NOTE — Telephone Encounter (Signed)
Please let pt know that I would like to get updated MRI to be sure there are no new neuromas.  For his pain, there is room to increase gabapentin - I recommend increasing gabapentin to 300mg  in the morning and 600mg  at bedtime x 1 week, then 600mg  twice daily. Updated Rx has been sent.

## 2022-12-21 NOTE — Telephone Encounter (Signed)
Called patient to get more information on what has been going on. Patient states that his numbness in his left leg is worsening and states the pain is bad. He has been taking Gabapentin 300 mg one in the morning and one in the evening. He has also been taking ibuprofen in between which has not helped. I informed patient I will send Dr. Allena Katz a message and give him a call back.

## 2023-01-04 ENCOUNTER — Ambulatory Visit: Payer: Medicaid Other

## 2023-01-09 ENCOUNTER — Encounter: Payer: Self-pay | Admitting: Neurology

## 2023-01-11 ENCOUNTER — Other Ambulatory Visit: Payer: Medicaid Other

## 2023-01-11 ENCOUNTER — Ambulatory Visit
Admission: RE | Admit: 2023-01-11 | Discharge: 2023-01-11 | Disposition: A | Discharge status: Home / Self Care | Payer: Medicaid Other | Source: Ambulatory Visit | Attending: Neurology | Admitting: Neurology

## 2023-01-11 DIAGNOSIS — Q8501 Neurofibromatosis, type 1: Secondary | ICD-10-CM

## 2023-01-11 DIAGNOSIS — D3614 Benign neoplasm of peripheral nerves and autonomic nervous system of thorax: Secondary | ICD-10-CM

## 2023-01-11 MED ORDER — GADOPICLENOL 0.5 MMOL/ML IV SOLN
7.0000 mL | Freq: Once | INTRAVENOUS | Status: AC | PRN
  Administered 2023-01-11: 7 mL via INTRAVENOUS

## 2023-01-18 ENCOUNTER — Ambulatory Visit: Payer: Medicaid Other | Admitting: Pulmonary Disease

## 2023-01-18 ENCOUNTER — Encounter: Payer: Self-pay | Admitting: Primary Care

## 2023-01-18 ENCOUNTER — Ambulatory Visit: Payer: Medicaid Other | Admitting: Primary Care

## 2023-01-18 VITALS — BP 146/104 | HR 69 | Ht 67.0 in | Wt 156.2 lb

## 2023-01-18 DIAGNOSIS — J439 Emphysema, unspecified: Secondary | ICD-10-CM

## 2023-01-18 DIAGNOSIS — J449 Chronic obstructive pulmonary disease, unspecified: Secondary | ICD-10-CM

## 2023-01-18 LAB — PULMONARY FUNCTION TEST
DL/VA % pred: 68 %
DL/VA: 3.16 ml/min/mmHg/L
DLCO cor % pred: 53 %
DLCO cor: 14.59 ml/min/mmHg
DLCO unc % pred: 51 %
DLCO unc: 14.16 ml/min/mmHg
FEF 25-75 Post: 3.12 L/s
FEF 25-75 Pre: 1.63 L/s
FEF2575-%Change-Post: 90 %
FEF2575-%Pred-Post: 90 %
FEF2575-%Pred-Pre: 47 %
FEV1-%Change-Post: 16 %
FEV1-%Pred-Post: 94 %
FEV1-%Pred-Pre: 80 %
FEV1-Post: 3.49 L
FEV1-Pre: 3 L
FEV1FVC-%Change-Post: 1 %
FEV1FVC-%Pred-Pre: 82 %
FEV6-%Change-Post: 15 %
FEV6-%Pred-Post: 114 %
FEV6-%Pred-Pre: 99 %
FEV6-Post: 5.19 L
FEV6-Pre: 4.51 L
FEV6FVC-%Change-Post: 0 %
FEV6FVC-%Pred-Post: 102 %
FEV6FVC-%Pred-Pre: 101 %
FVC-%Change-Post: 14 %
FVC-%Pred-Post: 111 %
FVC-%Pred-Pre: 97 %
FVC-Post: 5.25 L
FVC-Pre: 4.59 L
Post FEV1/FVC ratio: 66 %
Post FEV6/FVC ratio: 99 %
Pre FEV1/FVC ratio: 65 %
Pre FEV6/FVC Ratio: 98 %
RV % pred: 165 %
RV: 2.93 L
TLC % pred: 115 %
TLC: 7.35 L

## 2023-01-18 MED ORDER — TRELEGY ELLIPTA 100-62.5-25 MCG/ACT IN AEPB: 1.0000 | INHALATION_SPRAY | Freq: Every day | RESPIRATORY_TRACT | 5 refills | Status: AC

## 2023-01-18 NOTE — Patient Instructions (Signed)
Full PFT performed today. °

## 2023-01-18 NOTE — Patient Instructions (Addendum)
Pulmonary function testing showed mild COPD with asthma overlap  Recommend stopping Stiolto  Start Trelegy 1 puff daily in the morning (rinse mouth after use)  Continue to use Albuterol 2 puffs every 4-6 hours as needed for sob/wheezing  If inhaler is too expensive or not as effective as Stiolto send me a [employer] ge[facility]  Continue to abstain from tobacco use, congratulations on quitting   Follow-up: 6 months with Dr. Wynona Neat

## 2023-01-18 NOTE — Progress Notes (Signed)
@Patient  ID: James [name], male    DOB: November 10, 1975, 47 y.o.   MRN: [id number]  Chief Complaint  Patient presents with   Follow-up    Referring provider: Celine Mans, MD  HPI: 47 year old male, former smoker.  Medical history significant for hypertension, emphysema, GERD, vitamin D deficiency, depression.  Patient of Dr. Wynona Neat.   01/18/2023 Patient presents today follow-up/ COPD and emphysema. He is doing well. No recent respiratory infections. Complaint with Stiolto daily. Uses Albuterol when outside playing with his children. Experiences wheezing symptoms in the morning. He has quit smoking.   Pulmonary function testing:  01/18/2023 PFTs >> FVC 5.25 (111%), FEV1 3.49 (94%), ratio 66, TLC 115%, DLCO 14.59 (53%) Mild obstruction with positive bronchodilator response   No Known Allergies  Immunization History  Administered Date(s) Administered   Hep A / Hep B 04/07/2018, 04/14/2018, 04/28/2018   Influenza Split 02/25/2012   Influenza,inj,Quad PF,6+ Mos 05/11/2014, 04/25/2015, 05/03/2016, 02/19/2017, 06/19/2018, 07/02/2019, 04/14/2021   Influenza-Unspecified 04/11/2022   PFIZER Comirnaty(Gray Top)Covid-19 Tri-Sucrose Vaccine 07/18/2020   PFIZER(Purple Top)SARS-COV-2 Vaccination 12/24/2019, 01/14/2020   PPD Test 01/09/2012   Tdap 02/25/2012    Past Medical History:  Diagnosis Date   Alcohol dependence with uncomplicated withdrawal (HCC) 06/07/2017   Alcohol use disorder, moderate, dependence (HCC) 04/06/2018   Asthma    Black stools    Bronchitis    11/2011   COPD (chronic obstructive pulmonary disease) (HCC)    Difficulty sleeping    Dizziness    Emphysema of lung (HCC)    ETOH abuse    Former smoker    GERD (gastroesophageal reflux disease)    Hypertension goal BP (blood pressure) < 140/90 12/21/2011   Hypokalemia 12/28/2019   Neurofibromatosis, type 1 (HCC)    diagnosed 2013   Neuromuscular disorder (HCC) 2014   neurofibrosis   Shortness of breath     with exertion   Vomiting blood     Tobacco History: Social History   Tobacco Use  Smoking Status Former   Current packs/day: 0.50   Average packs/day: 0.5 packs/day for 20.7 years (10.3 ttl pk-yrs)   Types: Cigarettes   Start date: 05/28/2002  Smokeless Tobacco Never  Tobacco Comments   tobacco infor given 04/01/18   Counseling given: Not Answered Tobacco comments: tobacco infor given 04/01/18   Outpatient Medications Prior to Visit  Medication Sig Dispense Refill   amLODipine (NORVASC) 5 MG tablet TAKE 1 TABLET (5 MG TOTAL) BY MOUTH DAILY. (Patient not taking: Reported on 11/08/2022) 90 tablet 0   amLODipine-olmesartan (AZOR) 5-20 MG tablet Take 1 tablet by mouth daily.     buPROPion (WELLBUTRIN XL) 150 MG 24 hr tablet TAKE 1 TABLET BY MOUTH EVERY DAY (Patient taking differently: Take 150 mg by mouth daily as needed (anxiety).) 90 tablet 1   cyclobenzaprine (FLEXERIL) 5 MG tablet Take 1 tablet at bedtime as needed for 1 week, if tolerating you can take 1 tablet twice daily as needed. (Patient taking differently: Take 5 mg by mouth daily as needed for muscle spasms. Take 1 tablet at bedtime as needed for 1 week, if tolerating you can take 1 tablet twice daily as needed.) 60 tablet 5   famotidine (PEPCID) 20 MG tablet Take 1 tablet (20 mg total) by mouth 2 (two) times daily. 30 tablet 0   gabapentin (NEURONTIN) 300 MG capsule Take 300mg  in the morning and 600mg  at bedtime x 1 week, then 600mg  twice daily. 120 capsule 3   ibuprofen (ADVIL) 600 MG tablet  TAKE 1 TABLET BY MOUTH EVERY 8 HOURS AS NEEDED (Patient taking differently: Take 600 mg by mouth daily.) 60 tablet 1   omeprazole (PRILOSEC) 20 MG capsule Take 20 mg by mouth daily.     ondansetron (ZOFRAN) 4 MG tablet Take 1 tablet (4 mg total) by mouth every 6 (six) hours. 30 tablet 0   ondansetron (ZOFRAN-ODT) 4 MG disintegrating tablet Take 4 mg by mouth every 6 (six) hours as needed for nausea or vomiting.     VENTOLIN HFA 108 (90 Base)  MCG/ACT inhaler TAKE 2 PUFFS BY MOUTH EVERY 6 HOURS AS NEEDED FOR WHEEZE OR SHORTNESS OF BREATH 18 each 1   STIOLTO RESPIMAT 2.5-2.5 MCG/ACT AERS INHALE 2 PUFFS BY MOUTH INTO THE LUNGS DAILY (Patient taking differently: Inhale 2 each into the lungs 2 (two) times daily.) 4 g 3   No facility-administered medications prior to visit.   Review of Systems  Review of Systems  Constitutional: Negative.   HENT: Negative.    Respiratory:  Positive for wheezing. Negative for cough.   Cardiovascular: Negative.    Physical Exam  BP (!) 146/104 (BP Location: Left Arm, Cuff Size: Normal)   Pulse 69   Ht 5\' 7"  (1.702 m)   Wt 156 lb 3.2 oz (70.9 kg)   SpO2 99%   BMI 24.46 kg/m  Physical Exam Constitutional:      General: He is not in acute distress.    Appearance: Normal appearance. He is not ill-appearing.  HENT:     Head: Normocephalic and atraumatic.     Mouth/Throat:     Mouth: Mucous membranes are moist.     Pharynx: Oropharynx is clear.  Cardiovascular:     Rate and Rhythm: Normal rate and regular rhythm.  Pulmonary:     Effort: Pulmonary effort is normal.     Breath sounds: Normal breath sounds.  Musculoskeletal:        General: Normal range of motion.  Skin:    General: Skin is warm and dry.  Neurological:     General: No focal deficit present.     Mental Status: He is alert and oriented to person, place, and time. Mental status is at baseline.  Psychiatric:        Mood and Affect: Mood normal.        Behavior: Behavior normal.        Thought Content: Thought content normal.        Judgment: Judgment normal.      Lab Results:  CBC    Component Value Date/Time   WBC 9.3 11/08/2022 1050   RBC 4.44 11/08/2022 1050   HGB 13.3 11/08/2022 1548   HGB 15.5 12/24/2019 1348   HCT 39.0 11/08/2022 1548   HCT 45.6 12/24/2019 1348   PLT 238 11/08/2022 1050   PLT 290 12/24/2019 1348   MCV 91.7 11/08/2022 1050   MCV 89 12/24/2019 1348   MCH 30.6 11/08/2022 1050   MCHC 33.4  11/08/2022 1050   RDW 12.6 11/08/2022 1050   RDW 12.1 12/24/2019 1348   LYMPHSABS 1.3 11/08/2022 1050   MONOABS 0.6 11/08/2022 1050   EOSABS 0.1 11/08/2022 1050   BASOSABS 0.1 11/08/2022 1050    BMET    Component Value Date/Time   NA 136 11/08/2022 1548   NA 141 07/24/2021 1546   K 3.6 11/08/2022 1548   CL 103 11/08/2022 1548   CO2 16 (L) 11/08/2022 1050   GLUCOSE 110 (H) 11/08/2022 1548   BUN <3 (  L) 11/08/2022 1548   BUN 10 07/24/2021 1546   CREATININE 0.80 11/08/2022 1548   CREATININE 0.90 05/03/2016 1657   CALCIUM 8.7 (L) 11/08/2022 1050   GFRNONAA >60 11/08/2022 1050   GFRNONAA >89 05/03/2016 1657   GFRAA 108 12/24/2019 1348   GFRAA >89 05/03/2016 1657    BNP No results found for: "BNP"  ProBNP No results found for: "PROBNP"  Imaging: MR Lumbar Spine W Wo Contrast  Result Date: 01/25/2023 CLINICAL DATA:  Neurofibroma. Mid to low back pain x2 years with loss of balance. Left-sided pain. EXAM: MRI THORACIC AND LUMBAR SPINE WITHOUT AND WITH CONTRAST TECHNIQUE: Multiplanar and multiecho pulse sequences of the thoracic and lumbar spine were obtained without and with intravenous contrast. CONTRAST:  7 mL Vueway. COMPARISON:  MRI thoracic spine 09/05/2021. Lumbar spine MRI 06/24/2020. FINDINGS: MRI THORACIC SPINE FINDINGS Alignment: Unchanged focal kyphosis of the upper thoracic spine with 4 mm anterolisthesis of T1 on T2 and 3 mm anterolisthesis of T2 on T3. Vertebrae: Unchanged T2 hyperintense, enhancing masses along the right C8 and T1 nerves. Unchanged T2 hyperintense, enhancing masses along the left T2 and T3 nerves with associated remodeling of the T2 and T3 vertebral bodies. These lesions are most consistent with neurofibromas. Cord:  Normal spinal cord signal and volume. Paraspinal and other soft tissues: Unremarkable. Disc levels: No significant disc herniation, spinal canal stenosis or neural foraminal narrowing. MRI LUMBAR SPINE FINDINGS Segmentation:  Standard.  Alignment:  Normal. Vertebrae: Modic type 1 degenerative endplate marrow signal changes at L5-S1. Unchanged expansile, heterogeneous lesion in the left iliac bone. Conus medullaris: Extends to the L2 level and appears normal. No abnormal enhancement of the cauda equina nerve roots. Paraspinal and other soft tissues: Unremarkable. Disc levels: Increased left central disc protrusion at L5-S1 displacing the traversing left S1 nerve root in the subarticular zone. IMPRESSION: 1. Unchanged neurofibromas along the right C8 and T1 nerves and left T2 and T3 nerves with associated remodeling of the T2 and T3 vertebral bodies. 2. Increased left central disc protrusion at L5-S1 displacing the traversing left S1 nerve root in the subarticular zone. 3. Unchanged expansile, heterogeneous lesion in the left iliac bone, probably benign. Electronically Signed   By: Orvan Falconer M.D.   On: 01/25/2023 14:04   MR THORACIC SPINE W WO CONTRAST  Result Date: 01/25/2023 CLINICAL DATA:  Neurofibroma. Mid to low back pain x2 years with loss of balance. Left-sided pain. EXAM: MRI THORACIC AND LUMBAR SPINE WITHOUT AND WITH CONTRAST TECHNIQUE: Multiplanar and multiecho pulse sequences of the thoracic and lumbar spine were obtained without and with intravenous contrast. CONTRAST:  7 mL Vueway. COMPARISON:  MRI thoracic spine 09/05/2021. Lumbar spine MRI 06/24/2020. FINDINGS: MRI THORACIC SPINE FINDINGS Alignment: Unchanged focal kyphosis of the upper thoracic spine with 4 mm anterolisthesis of T1 on T2 and 3 mm anterolisthesis of T2 on T3. Vertebrae: Unchanged T2 hyperintense, enhancing masses along the right C8 and T1 nerves. Unchanged T2 hyperintense, enhancing masses along the left T2 and T3 nerves with associated remodeling of the T2 and T3 vertebral bodies. These lesions are most consistent with neurofibromas. Cord:  Normal spinal cord signal and volume. Paraspinal and other soft tissues: Unremarkable. Disc levels: No significant disc  herniation, spinal canal stenosis or neural foraminal narrowing. MRI LUMBAR SPINE FINDINGS Segmentation:  Standard. Alignment:  Normal. Vertebrae: Modic type 1 degenerative endplate marrow signal changes at L5-S1. Unchanged expansile, heterogeneous lesion in the left iliac bone. Conus medullaris: Extends to the L2 level and appears  normal. No abnormal enhancement of the cauda equina nerve roots. Paraspinal and other soft tissues: Unremarkable. Disc levels: Increased left central disc protrusion at L5-S1 displacing the traversing left S1 nerve root in the subarticular zone. IMPRESSION: 1. Unchanged neurofibromas along the right C8 and T1 nerves and left T2 and T3 nerves with associated remodeling of the T2 and T3 vertebral bodies. 2. Increased left central disc protrusion at L5-S1 displacing the traversing left S1 nerve root in the subarticular zone. 3. Unchanged expansile, heterogeneous lesion in the left iliac bone, probably benign. Electronically Signed   By: Orvan Falconer M.D.   On: 01/25/2023 14:04     Assessment & Plan:   COPD, mild (HCC) Pulmonary function testing showed mild COPD with asthma overlap. He experiences wheezing symptoms in the morning. Recommend changing Stiolto to Trelegy 1 puff daily in the morning (rinse mouth after use). Continue to use Albuterol 2 puffs every 4-6 hours as needed for sob/wheezing. Advised patient continue to abstain from tobacco use, congratulated on quitting   Follow-up: 6 months with Dr. Wynona Neat   Hypertension - BP elevated, asymptomatic. Patient did not take medication yet today.  - Continue Amlodipine-Olmesartan (Azor) 5-20mg   - Follow up with primary care    Glenford Bayley, NP 01/26/2023

## 2023-01-18 NOTE — Progress Notes (Signed)
Full PFT performed today. °

## 2023-01-26 NOTE — Assessment & Plan Note (Signed)
Pulmonary function testing showed mild COPD with asthma overlap. He experiences wheezing symptoms in the morning. Recommend changing Stiolto to Trelegy 1 puff daily in the morning (rinse mouth after use). Continue to use Albuterol 2 puffs every 4-6 hours as needed for sob/wheezing. Advised patient continue to abstain from tobacco use, congratulated on quitting   Follow-up: 6 months with Dr. Wynona Neat

## 2023-01-26 NOTE — Assessment & Plan Note (Signed)
-   BP elevated, asymptomatic. Patient did not take medication yet today.  - Continue Amlodipine-Olmesartan (Azor) 5-20mg   - Follow up with primary care

## 2023-01-30 ENCOUNTER — Other Ambulatory Visit: Payer: Self-pay

## 2023-01-30 DIAGNOSIS — M5416 Radiculopathy, lumbar region: Secondary | ICD-10-CM

## 2023-01-30 DIAGNOSIS — M5126 Other intervertebral disc displacement, lumbar region: Secondary | ICD-10-CM

## 2023-01-30 DIAGNOSIS — D3614 Benign neoplasm of peripheral nerves and autonomic nervous system of thorax: Secondary | ICD-10-CM

## 2023-02-18 ENCOUNTER — Other Ambulatory Visit: Payer: Self-pay

## 2023-02-18 ENCOUNTER — Encounter (HOSPITAL_COMMUNITY): Payer: Self-pay

## 2023-02-18 ENCOUNTER — Emergency Department (HOSPITAL_COMMUNITY): Payer: Medicaid Other

## 2023-02-18 ENCOUNTER — Emergency Department (HOSPITAL_COMMUNITY)
Admission: EM | Admit: 2023-02-18 | Discharge: 2023-02-18 | Discharge status: Against Medical Advice | Payer: Medicaid Other | Attending: Emergency Medicine | Admitting: Emergency Medicine

## 2023-02-18 DIAGNOSIS — R000 Tachycardia, unspecified: Secondary | ICD-10-CM | POA: Insufficient documentation

## 2023-02-18 DIAGNOSIS — R0789 Other chest pain: Secondary | ICD-10-CM | POA: Diagnosis not present

## 2023-02-18 DIAGNOSIS — Z79899 Other long term (current) drug therapy: Secondary | ICD-10-CM | POA: Diagnosis not present

## 2023-02-18 DIAGNOSIS — I10 Essential (primary) hypertension: Secondary | ICD-10-CM | POA: Diagnosis not present

## 2023-02-18 DIAGNOSIS — Z5329 Procedure and treatment not carried out because of patient's decision for other reasons: Secondary | ICD-10-CM | POA: Diagnosis not present

## 2023-02-18 DIAGNOSIS — R079 Chest pain, unspecified: Secondary | ICD-10-CM | POA: Diagnosis present

## 2023-02-18 LAB — CBC
HCT: 47.1 % (ref 39.0–52.0)
Hemoglobin: 15.8 g/dL (ref 13.0–17.0)
MCH: 31.1 pg (ref 26.0–34.0)
MCHC: 33.5 g/dL (ref 30.0–36.0)
MCV: 92.7 fL (ref 80.0–100.0)
Platelets: 251 10*3/uL (ref 150–400)
RBC: 5.08 MIL/uL (ref 4.22–5.81)
RDW: 12.4 % (ref 11.5–15.5)
WBC: 7.2 10*3/uL (ref 4.0–10.5)
nRBC: 0 % (ref 0.0–0.2)

## 2023-02-18 LAB — BASIC METABOLIC PANEL
Anion gap: 18 — ABNORMAL HIGH (ref 5–15)
BUN: <5 mg/dL — ABNORMAL LOW (ref 6–20)
CO2: 19 mmol/L — ABNORMAL LOW (ref 22–32)
Calcium: 9.1 mg/dL (ref 8.9–10.3)
Chloride: 99 mmol/L (ref 98–111)
Creatinine, Ser: 0.84 mg/dL (ref 0.61–1.24)
GFR, Estimated: >60 mL/min (ref >60)
Glucose, Bld: 74 mg/dL (ref 70–99)
Potassium: 3.4 mmol/L — ABNORMAL LOW (ref 3.5–5.1)
Sodium: 136 mmol/L (ref 135–145)

## 2023-02-18 LAB — HEPATIC FUNCTION PANEL
ALT: 30 U/L (ref 0–44)
AST: 29 U/L (ref 15–41)
Albumin: 4.1 g/dL (ref 3.5–5.0)
Alkaline Phosphatase: 101 U/L (ref 38–126)
Bilirubin, Direct: 0.2 mg/dL (ref 0.0–0.2)
Indirect Bilirubin: 1.2 mg/dL — ABNORMAL HIGH (ref 0.3–0.9)
Total Bilirubin: 1.4 mg/dL — ABNORMAL HIGH (ref 0.3–1.2)
Total Protein: 7.5 g/dL (ref 6.5–8.1)

## 2023-02-18 LAB — TROPONIN I (HIGH SENSITIVITY)
Troponin I (High Sensitivity): 3 ng/L (ref <18)
Troponin I (High Sensitivity): 2 ng/L (ref <18)

## 2023-02-18 LAB — LIPASE, BLOOD: Lipase: 23 U/L (ref 11–51)

## 2023-02-18 LAB — ETHANOL: Alcohol, Ethyl (B): <10 mg/dL (ref <10)

## 2023-02-18 MED ORDER — FAMOTIDINE IN NACL 20-0.9 MG/50ML-% IV SOLN
20.0000 mg | Freq: Once | INTRAVENOUS | Status: AC
  Administered 2023-02-18: 20 mg via INTRAVENOUS
  Filled 2023-02-18: qty 50

## 2023-02-18 MED ORDER — SODIUM CHLORIDE 0.9 % IV BOLUS
1000.0000 mL | Freq: Once | INTRAVENOUS | Status: AC
  Administered 2023-02-18: 1000 mL via INTRAVENOUS

## 2023-02-18 MED ORDER — ONDANSETRON HCL 4 MG/2ML IJ SOLN
4.0000 mg | Freq: Once | INTRAMUSCULAR | Status: AC
  Administered 2023-02-18: 4 mg via INTRAVENOUS
  Filled 2023-02-18: qty 2

## 2023-02-18 NOTE — ED Notes (Signed)
Pt advised staff he was leaving without being d/c. Encouraged him to stay, but he insisted on leaving. Pt taken otf

## 2023-02-18 NOTE — ED Provider Notes (Signed)
[facility]MENT AT [facility] Provider Note   CSN: [id number] Arrival date & time: 02/18/23  7846     History  Chief Complaint  Patient presents with   Chest Pain   Nausea   Emesis   Lightheaded    James [name] is a 47 y.o. male.  HPI Patient presents with chest pain.  He has, the nausea, weakness.  Onset was about 10 hours prior to ED arrival.  Since onset symptoms been persistent.  Patient notes ongoing alcohol use.  No syncope, no fever.    Home Medications Prior to Admission medications   Medication Sig Start Date End Date Taking? Authorizing Provider  amLODipine-olmesartan (AZOR) 5-20 MG tablet Take 1 tablet by mouth daily. 11/02/22   [provider]  buPROPion (WELLBUTRIN XL) 150 MG 24 hr tablet TAKE 1 TABLET BY MOUTH EVERY DAY Patient taking differently: Take 150 mg by mouth daily as needed (anxiety). 01/26/22   Littie Deeds, MD  cyclobenzaprine (FLEXERIL) 5 MG tablet Take 1 tablet at bedtime as needed for 1 week, if tolerating you can take 1 tablet twice daily as needed. Patient taking differently: Take 5 mg by mouth daily as needed for muscle spasms. Take 1 tablet at bedtime as needed for 1 week, if tolerating you can take 1 tablet twice daily as needed. 08/27/22   Patel, Roxana Hires K, DO  famotidine (PEPCID) 20 MG tablet Take 1 tablet (20 mg total) by mouth 2 (two) times daily. 11/08/22   Dartha Lodge, PA-C  Fluticasone-Umeclidin-Vilant (TRELEGY ELLIPTA) 100-62.5-25 MCG/ACT AEPB Inhale 1 puff into the lungs daily. 01/18/23   Glenford Bayley, NP  gabapentin (NEURONTIN) 300 MG capsule Take 300mg  in the morning and 600mg  at bedtime x 1 week, then 600mg  twice daily. 12/21/22   Patel, Roxana Hires K, DO  ibuprofen (ADVIL) 600 MG tablet TAKE 1 TABLET BY MOUTH EVERY 8 HOURS AS NEEDED Patient taking differently: Take 600 mg by mouth daily. 01/26/22   Littie Deeds, MD  omeprazole (PRILOSEC) 20 MG capsule Take 20 mg by mouth daily. 11/01/22   [provider]  ondansetron (ZOFRAN) 4 MG tablet Take 1 tablet (4 mg total) by mouth every 6 (six) hours. 11/08/22   Dartha Lodge, PA-C  ondansetron (ZOFRAN-ODT) 4 MG disintegrating tablet Take 4 mg by mouth every 6 (six) hours as needed for nausea or vomiting. 11/07/22   [provider]  VENTOLIN HFA 108 (90 Base) MCG/ACT inhaler TAKE 2 PUFFS BY MOUTH EVERY 6 HOURS AS NEEDED FOR WHEEZE OR SHORTNESS OF BREATH 11/19/22   Celine Mans, MD      Allergies    Patient has no known allergies.    Review of Systems   Review of Systems  All other systems reviewed and are negative.   Physical Exam Updated Vital Signs BP (!) 147/117   Pulse 87   Temp 98.2 F (36.8 C) (Oral)   Resp 18   Ht 5\' 6"  (1.676 m)   Wt 61.2 kg   SpO2 100%   BMI 21.79 kg/m  Physical Exam Vitals and nursing note reviewed.  Constitutional:      General: He is not in acute distress.    Appearance: He is well-developed.  HENT:     Head: Normocephalic and atraumatic.  Eyes:     Conjunctiva/sclera: Conjunctivae normal.  Cardiovascular:     Rate and Rhythm: Regular rhythm. Tachycardia present.  Pulmonary:     Effort: Pulmonary effort is normal. No respiratory  distress.     Breath sounds: No stridor.  Abdominal:     General: There is no distension.  Skin:    General: Skin is warm and dry.  Neurological:     Mental Status: He is alert and oriented to person, place, and time.     ED Results / Procedures / Treatments   Labs (all labs ordered are listed, but only abnormal results are displayed) Labs Reviewed  BASIC METABOLIC PANEL - Abnormal; Notable for the following components:      Result Value   Potassium 3.4 (*)    CO2 19 (*)    BUN <5 (*)    Anion gap 18 (*)    All other components within normal limits  HEPATIC FUNCTION PANEL - Abnormal; Notable for the following components:   Total Bilirubin 1.4 (*)    Indirect Bilirubin 1.2 (*)    All other components within normal limits  CBC   ETHANOL  LIPASE, BLOOD  TROPONIN I (HIGH SENSITIVITY)  TROPONIN I (HIGH SENSITIVITY)    EKG EKG Interpretation Date/Time:  Monday February 18 2023 08:06:08 EDT Ventricular Rate:  81 PR Interval:  150 QRS Duration:  80 QT Interval:  382 QTC Calculation: 443 R Axis:   69  Text Interpretation: Normal sinus rhythm Nonspecific ST abnormality Abnormal ECG Confirmed by Gerhard Munch [phone]) on 02/18/2023 11:48:34 AM  Radiology DG Chest 2 View  Result Date: 02/18/2023 CLINICAL DATA:  Provided history: Chest pain. Additional history provided: Central chest pain radiating to neck. Nausea/vomiting. Lightheadedness. EXAM: CHEST - 2 VIEW COMPARISON:  CT 06/22/2022. Prior chest radiographs 09/29/2020 and earlier. FINDINGS: Heart size within normal limits. Known left-sided pleural based mass at the level of the upper lobe better assessed on the prior chest CT of 06/22/2022. Bullous emphysema. No appreciable acute airspace consolidation. No evidence of pleural effusion or pneumothorax. No acute osseous abnormality identified. IMPRESSION: 1. No evidence of an acute cardiopulmonary abnormality. 2. Known left-sided pleural based mass at the level of the upper lobe, better assessed on the prior chest CT of 06/22/2022. 3. Bullous emphysema (emphysema (ICD10-J43.9)). Electronically Signed   By: Jackey Loge D.O.   On: 02/18/2023 08:54    Procedures Procedures    Medications Ordered in ED Medications  sodium chloride 0.9 % bolus 1,000 mL (0 mLs Intravenous Stopped 02/18/23 1339)  famotidine (PEPCID) IVPB 20 mg premix (0 mg Intravenous Stopped 02/18/23 1241)  ondansetron (ZOFRAN) injection 4 mg (4 mg Intravenous Given 02/18/23 1206)    ED Course/ Medical Decision Making/ A&P                                 Medical Decision Making Adult male with history of hypertension, alcohol use presents with chest pain, with radiation to the neck.  Given his risk profile including substance abuse, hypertension,  ACS, as well as esophagitis, pancreatitis, gastritis, pneumomediastinum is considered. Labs sent monitor started, Pepcid offered. Cardiac 85 sinus, though he was previously tachycardic Pulse ox 99% room air normal  Amount and/or Complexity of Data Reviewed External Data Reviewed: notes. Labs: ordered. Decision-making details documented in ED Course. Radiology: ordered and independent interpretation performed. Decision-making details documented in ED Course. ECG/medicine tests: ordered and independent interpretation performed. Decision-making details documented in ED Course.  Risk Prescription drug management. Decision regarding hospitalization.   Patient patient elected to department prior to completion of care, there was initial studies were reassuring, aside from x-ray  demonstrating previously demonstrated mass, no early evidence for ACS, some suspicion for gastroesophagitis given his alcohol use, description of pain, reassuring troponin, and ECG.         Final Clinical Impression(s) / ED Diagnoses Final diagnoses:  Atypical chest pain     Gerhard Munch, MD 02/18/23 1551

## 2023-02-18 NOTE — ED Notes (Signed)
Awaiting patient from lobby

## 2023-02-18 NOTE — ED Notes (Signed)
PT left AMA w/o being d/c. PT spoke with NT , I did not see PT.

## 2023-02-18 NOTE — ED Triage Notes (Signed)
Pt came in via POV d/t central CP that radiates into his neck, n/v & feeling lightheaded since 0400 this morning. A/Ox4, rates pain 8/10 while in triage. Describes the feeling in  his chest like something is sitting on his chest, denies SOB.

## 2023-06-25 ENCOUNTER — Other Ambulatory Visit: Payer: Self-pay | Admitting: Neurosurgery

## 2023-06-28 NOTE — Progress Notes (Signed)
Surgical Instructions   Your procedure is scheduled on Tuesday July 02, 2023. Report to [facility] Main Entrance "A" at 11:30 A.M., then check in with the Admitting office. Any questions or running late day of surgery: call [phone]  Questions prior to your surgery date: call [phone], Monday-Friday, 8am-4pm. If you experience any cold or flu symptoms such as cough, fever, chills, shortness of breath, etc. between now and your scheduled surgery, please notify us at the above number.     Remember:  Do not eat or drink  after midnight the night before your surgery  Take these medicines the morning of surgery with A SIP OF WATER  atorvastatin (LIPITOR)  STIOLTO RESPIMAT   May take these medicines IF NEEDED: cyclobenzaprine (FLEXERIL)  VENTOLIN HFA 108 (90 Base) MCG/ACT inhaler. Please bring with you to the hospital.    One week prior to surgery, STOP taking any Aspirin (unless otherwise instructed by your surgeon) Aleve, Naproxen, Ibuprofen, Motrin, Advil, Goody's, BC's, all herbal medications, fish oil, and non-prescription vitamins.                     Do NOT Smoke (Tobacco/Vaping) for 24 hours prior to your procedure.  If you use a CPAP at night, you may bring your mask/headgear for your overnight stay.   You will be asked to remove any contacts, glasses, piercing's, hearing aid's, dentures/partials prior to surgery. Please bring cases for these items if needed.    Patients discharged the day of surgery will not be allowed to drive home, and someone needs to stay with them for 24 hours.  SURGICAL WAITING ROOM VISITATION Patients may have no more than 2 support people in the waiting area - these visitors may rotate.   Pre-op nurse will coordinate an appropriate time for 1 ADULT support person, who may not rotate, to accompany patient in pre-op.  Children under the age of 83 must have an adult with them who is not the patient and must remain in the main waiting area with  an adult.  If the patient needs to stay at the hospital during part of their recovery, the visitor guidelines for inpatient rooms apply.  Please refer to the [facility] website for the visitor guidelines for any additional information.   If you received a COVID test during your pre-op visit  it is requested that you wear a mask when out in public, stay away from anyone that may not be feeling well and notify your surgeon if you develop symptoms. If you have been in contact with anyone that has tested positive in the last 10 days please notify you surgeon.      Pre-operative 5 CHG Bathing Instructions   You can play a key role in reducing the risk of infection after surgery. Your skin needs to be as free of germs as possible. You can reduce the number of germs on your skin by washing with CHG (chlorhexidine gluconate) soap before surgery. CHG is an antiseptic soap that kills germs and continues to kill germs even after washing.   DO NOT use if you have an allergy to chlorhexidine/CHG or antibacterial soaps. If your skin becomes reddened or irritated, stop using the CHG and notify one of our RNs at [phone].   Please shower with the CHG soap starting 4 days before surgery using the following schedule:     Please keep in mind the following:  DO NOT shave, including legs and underarms, starting the day of  your first shower.   You may shave your face at any point before/day of surgery.  Place clean sheets on your bed the day you start using CHG soap. Use a clean washcloth (not used since being washed) for each shower. DO NOT sleep with pets once you start using the CHG.   CHG Shower Instructions:  Wash your face and private area with normal soap. If you choose to wash your hair, wash first with your normal shampoo.  After you use shampoo/soap, rinse your hair and body thoroughly to remove shampoo/soap residue.  Turn the water OFF and apply about 3 tablespoons (45 ml) of CHG soap to a  CLEAN washcloth.  Apply CHG soap ONLY FROM YOUR NECK DOWN TO YOUR TOES (washing for 3-5 minutes)  DO NOT use CHG soap on face, private areas, open wounds, or sores.  Pay special attention to the area where your surgery is being performed.  If you are having back surgery, having someone wash your back for you may be helpful. Wait 2 minutes after CHG soap is applied, then you may rinse off the CHG soap.  Pat dry with a clean towel  Put on clean clothes/pajamas   If you choose to wear lotion, please use ONLY the CHG-compatible lotions that are listed below.  Additional instructions for the day of surgery: DO NOT APPLY any lotions, deodorants or cologne.   Do not bring valuables to the hospital. [facility] is not responsible for any belongings/valuables. Do not wear jewelry Put on clean/comfortable clothes.  Please brush your teeth.  Ask your nurse before applying any prescription medications to the skin.     CHG Compatible Lotions   Aveeno Moisturizing lotion  Cetaphil Moisturizing Cream  Cetaphil Moisturizing Lotion  Clairol Herbal Essence Moisturizing Lotion, Dry Skin  Clairol Herbal Essence Moisturizing Lotion, Extra Dry Skin  Clairol Herbal Essence Moisturizing Lotion, Normal Skin  Curel Age Defying Therapeutic Moisturizing Lotion with Alpha Hydroxy  Curel Extreme Care Body Lotion  Curel Soothing Hands Moisturizing Hand Lotion  Curel Therapeutic Moisturizing Cream, Fragrance-Free  Curel Therapeutic Moisturizing Lotion, Fragrance-Free  Curel Therapeutic Moisturizing Lotion, Original Formula  Eucerin Daily Replenishing Lotion  Eucerin Dry Skin Therapy Plus Alpha Hydroxy Crme  Eucerin Dry Skin Therapy Plus Alpha Hydroxy Lotion  Eucerin Original Crme  Eucerin Original Lotion  Eucerin Plus Crme Eucerin Plus Lotion  Eucerin TriLipid Replenishing Lotion  Keri Anti-Bacterial Hand Lotion  Keri Deep Conditioning Original Lotion Dry Skin Formula Softly Scented  Keri Deep  Conditioning Original Lotion, Fragrance Free Sensitive Skin Formula  Keri Lotion Fast Absorbing Fragrance Free Sensitive Skin Formula  Keri Lotion Fast Absorbing Softly Scented Dry Skin Formula  Keri Original Lotion  Keri Skin Renewal Lotion Keri Silky Smooth Lotion  Keri Silky Smooth Sensitive Skin Lotion  Nivea Body Creamy Conditioning Oil  Nivea Body Extra Enriched Lotion  Nivea Body Original Lotion  Nivea Body Sheer Moisturizing Lotion Nivea Crme  Nivea Skin Firming Lotion  NutraDerm 30 Skin Lotion  NutraDerm Skin Lotion  NutraDerm Therapeutic Skin Cream  NutraDerm Therapeutic Skin Lotion  ProShield Protective Hand Cream  Provon moisturizing lotion  Please read over the following fact sheets that you were given.

## 2023-07-01 ENCOUNTER — Encounter (HOSPITAL_COMMUNITY): Payer: Self-pay

## 2023-07-01 ENCOUNTER — Encounter (HOSPITAL_COMMUNITY)
Admission: RE | Admit: 2023-07-01 | Discharge: 2023-07-01 | Disposition: A | Discharge status: Home / Self Care | Payer: Medicaid Other | Source: Ambulatory Visit | Attending: Neurosurgery | Admitting: Neurosurgery

## 2023-07-01 ENCOUNTER — Other Ambulatory Visit: Payer: Self-pay

## 2023-07-01 VITALS — BP 128/98 | HR 98 | Temp 98.6°F | Resp 18 | Ht 66.0 in | Wt 178.7 lb

## 2023-07-01 DIAGNOSIS — Z01812 Encounter for preprocedural laboratory examination: Secondary | ICD-10-CM | POA: Diagnosis present

## 2023-07-01 DIAGNOSIS — F1090 Alcohol use, unspecified, uncomplicated: Secondary | ICD-10-CM | POA: Insufficient documentation

## 2023-07-01 DIAGNOSIS — Z789 Other specified health status: Secondary | ICD-10-CM

## 2023-07-01 DIAGNOSIS — Z01818 Encounter for other preprocedural examination: Secondary | ICD-10-CM

## 2023-07-01 DIAGNOSIS — I2510 Atherosclerotic heart disease of native coronary artery without angina pectoris: Secondary | ICD-10-CM | POA: Diagnosis not present

## 2023-07-01 LAB — BASIC METABOLIC PANEL
Anion gap: 10 (ref 5–15)
BUN: 8 mg/dL (ref 6–20)
CO2: 24 mmol/L (ref 22–32)
Calcium: 9.6 mg/dL (ref 8.9–10.3)
Chloride: 107 mmol/L (ref 98–111)
Creatinine, Ser: 0.93 mg/dL (ref 0.61–1.24)
GFR, Estimated: >60 mL/min (ref >60)
Glucose, Bld: 97 mg/dL (ref 70–99)
Potassium: 4.2 mmol/L (ref 3.5–5.1)
Sodium: 141 mmol/L (ref 135–145)

## 2023-07-01 LAB — CBC
HCT: 44 % (ref 39.0–52.0)
Hemoglobin: 14.8 g/dL (ref 13.0–17.0)
MCH: 30.8 pg (ref 26.0–34.0)
MCHC: 33.6 g/dL (ref 30.0–36.0)
MCV: 91.7 fL (ref 80.0–100.0)
Platelets: 197 10*3/uL (ref 150–400)
RBC: 4.8 MIL/uL (ref 4.22–5.81)
RDW: 11.8 % (ref 11.5–15.5)
WBC: 8.5 10*3/uL (ref 4.0–10.5)
nRBC: 0 % (ref 0.0–0.2)

## 2023-07-01 LAB — HEPATIC FUNCTION PANEL
ALT: 19 U/L (ref 0–44)
AST: 19 U/L (ref 15–41)
Albumin: 3.9 g/dL (ref 3.5–5.0)
Alkaline Phosphatase: 89 U/L (ref 38–126)
Bilirubin, Direct: <0.1 mg/dL (ref 0.0–0.2)
Total Bilirubin: 0.4 mg/dL (ref 0.0–1.2)
Total Protein: 7.1 g/dL (ref 6.5–8.1)

## 2023-07-01 LAB — SURGICAL PCR SCREEN
MRSA, PCR: NEGATIVE
Staphylococcus aureus: POSITIVE — AB

## 2023-07-01 NOTE — Progress Notes (Addendum)
Anesthesia Chart Review:  48 yo male former smoker (quit 11/2021) follows with pulmonology for hx of asthma/copd overlap. Last seen by Ames Dura, NP on 01/18/23. 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 He experiences wheezing symptoms in the morning. Recommend changing Stiolto to Trelegy 1 puff daily in the morning (rinse mouth after use). Continue to use Albuterol 2 puffs every 4-6 hours as needed for sob/wheezing. Advised patient continue to abstain from tobacco use, congratulated on quitting."  01/18/2023 PFTs >> FVC 5.25 (111%), FEV1 3.49 (94%), ratio 66, TLC 115%, DLCO 14.59 (53%)   Other pertinent hx includes prior etoh abuse, neurofibromatosis type 1, GERD (on famotidine).   Preop labs reviewed, WNL.  EKG 02/18/23: Normal sinus rhythm. Rate 81.Nonspecific ST abnormality  CHEST - 2 VIEW 02/18/23: COMPARISON:  CT 06/22/2022. Prior chest radiographs 09/29/2020 and earlier.   FINDINGS: Heart size within normal limits. Known left-sided pleural based mass at the level of the upper lobe better assessed on the prior chest CT of 06/22/2022. Bullous emphysema. No appreciable acute airspace consolidation. No evidence of pleural effusion or pneumothorax. No acute osseous abnormality identified.   IMPRESSION: 1. No evidence of an acute cardiopulmonary abnormality. 2. Known left-sided pleural based mass at the level of the upper lobe, better assessed on the prior chest CT of 06/22/2022. 3. Bullous emphysema (emphysema (ICD10-J43.9)).   Zannie Cove [facility] Short Stay Center/Anesthesiology Phone [phone] 07/01/2023 11:27 AM

## 2023-07-01 NOTE — Anesthesia Preprocedure Evaluation (Addendum)
Anesthesia Evaluation  Patient identified by MRN, date of birth, ID band Patient awake    Reviewed: Allergy & Precautions, NPO status , Patient's Chart, lab work & pertinent test results  History of Anesthesia Complications Negative for: history of anesthetic complications  Airway Mallampati: I  TM Distance: >3 FB Neck ROM: Full    Dental  (+) Dental Advisory Given, Missing   Pulmonary asthma , COPD,  COPD inhaler, Current SmokerPatient did not abstain from smoking.   breath sounds clear to auscultation       Cardiovascular hypertension, Pt. on medications (-) angina  Rhythm:Regular Rate:Normal     Neuro/Psych  Headaches   Depression       GI/Hepatic ,GERD  Medicated and Controlled,,(+)     substance abuse  alcohol use  Endo/Other  negative endocrine ROS    Renal/GU negative Renal ROS     Musculoskeletal   Abdominal   Peds  Hematology negative hematology ROS (+)   Anesthesia Other Findings   Reproductive/Obstetrics                             Anesthesia Physical Anesthesia Plan  ASA: 3  Anesthesia Plan: General   Post-op Pain Management: Tylenol PO (pre-op)*   Induction: Intravenous  PONV Risk Score and Plan: 1 and Ondansetron and Dexamethasone  Airway Management Planned: Oral ETT  Additional Equipment: None  Intra-op Plan:   Post-operative Plan: Extubation in OR  Informed Consent: I have reviewed the patients History and Physical, chart, labs and discussed the procedure including the risks, benefits and alternatives for the proposed anesthesia with the patient or authorized representative who has indicated his/her understanding and acceptance.     Dental advisory given  Plan Discussed with: CRNA and Surgeon  Anesthesia Plan Comments: (PAT note by Antionette Poles, PA-C: 48 yo male former smoker (quit 11/2021) follows with pulmonology for hx of asthma/copd overlap. Last  seen by Ames Dura, NP on 01/18/23. 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 He experiences wheezing symptoms in the morning. Recommend changing Stiolto to Trelegy 1 puff daily in the morning (rinse mouth after use). Continue to use Albuterol 2 puffs every 4-6 hours as needed for sob/wheezing. Advised patient continue to abstain from tobacco use, congratulated on quitting."  01/18/2023 PFTs >> FVC 5.25 (111%), FEV1 3.49 (94%), ratio 66, TLC 115%, DLCO 14.59 (53%)   Other pertinent hx includes prior etoh abuse, neurofibromatosis type 1, GERD (on famotidine).   Preop labs reviewed, WNL.  EKG 02/18/23: Normal sinus rhythm. Rate 81.Nonspecific ST abnormality  CHEST - 2 VIEW 02/18/23: COMPARISON:  CT 06/22/2022. Prior chest radiographs 09/29/2020 and earlier.  FINDINGS: Heart size within normal limits. Known left-sided pleural based mass at the level of the upper lobe better assessed on the prior chest CT of 06/22/2022. Bullous emphysema. No appreciable acute airspace consolidation. No evidence of pleural effusion or pneumothorax. No acute osseous abnormality identified.  IMPRESSION: 1. No evidence of an acute cardiopulmonary abnormality. 2. Known left-sided pleural based mass at the level of the upper lobe, better assessed on the prior chest CT of 06/22/2022. 3. Bullous emphysema (emphysema (ICD10-J43.9)).)        Anesthesia Quick Evaluation

## 2023-07-01 NOTE — Progress Notes (Signed)
PCP - Dr. Celine Mans Cardiologist - Denies  PPM/ICD - Denies Device Orders - n/a Rep Notified - n/a  Chest x-ray - 02/18/2023 EKG - 02/18/2023 Stress Test - Denies ECHO - Denies Cardiac Cath - Denies  Sleep Study - Denies CPAP - n/a  No DM  Last dose of GLP1 agonist- n/a GLP1 instructions: n/a  Blood Thinner Instructions: n/a Aspirin Instructions: n/a  NPO after midnight  COVID TEST- n/a   Anesthesia review: Yes. Hx of HTN, COPD, Asthma, Emphysema and Neurofibromatosis    Patient denies shortness of breath, fever, cough and chest pain at PAT appointment. Pt denies any respiratory illness/infection in the last two months.   All instructions explained to the patient, with a verbal understanding of the material. Patient agrees to go over the instructions while at home for a better understanding. Patient also instructed to self quarantine after being tested for COVID-19. The opportunity to ask questions was provided.

## 2023-07-01 NOTE — Pre-Procedure Instructions (Signed)
Surgical Instructions   Your procedure is scheduled on Tuesday July 02, 2023. Report to [facility] Main Entrance "A" at 11:30 A.M., then check in with the Admitting office. Any questions or running late day of surgery: call [phone]  Questions prior to your surgery date: call [phone], Monday-Friday, 8am-4pm. If you experience any cold or flu symptoms such as cough, fever, chills, shortness of breath, etc. between now and your scheduled surgery, please notify us at the above number.     Remember:  Do not eat or drink  after midnight the night before your surgery.   Take these medicines the morning of surgery with A SIP OF WATER  atorvastatin (LIPITOR)  STIOLTO RESPIMAT    May take these medicines IF NEEDED: cyclobenzaprine (FLEXERIL)  VENTOLIN HFA 108 (90 Base) inhaler - please bring inhaler with you morning of surgery   One week prior to surgery, STOP taking any Aspirin (unless otherwise instructed by your surgeon) Aleve, Naproxen, Ibuprofen, Motrin, Advil, Goody's, BC's, all herbal medications, fish oil, and non-prescription vitamins.                     Do NOT Smoke (Tobacco/Vaping) for 24 hours prior to your procedure.  If you use a CPAP at night, you may bring your mask/headgear for your overnight stay.   You will be asked to remove any contacts, glasses, piercing's, hearing aid's, dentures/partials prior to surgery. Please bring cases for these items if needed.    Patients discharged the day of surgery will not be allowed to drive home, and someone needs to stay with them for 24 hours.  SURGICAL WAITING ROOM VISITATION Patients may have no more than 2 support people in the waiting area - these visitors may rotate.   Pre-op nurse will coordinate an appropriate time for 1 ADULT support person, who may not rotate, to accompany patient in pre-op.  Children under the age of 59 must have an adult with them who is not the patient and must remain in the main waiting  area with an adult.  If the patient needs to stay at the hospital during part of their recovery, the visitor guidelines for inpatient rooms apply.  Please refer to the [facility] website for the visitor guidelines for any additional information.   If you received a COVID test during your pre-op visit  it is requested that you wear a mask when out in public, stay away from anyone that may not be feeling well and notify your surgeon if you develop symptoms. If you have been in contact with anyone that has tested positive in the last 10 days please notify you surgeon.      Pre-operative CHG Bathing Instructions   You can play a key role in reducing the risk of infection after surgery. Your skin needs to be as free of germs as possible. You can reduce the number of germs on your skin by washing with CHG (chlorhexidine gluconate) soap before surgery. CHG is an antiseptic soap that kills germs and continues to kill germs even after washing.   DO NOT use if you have an allergy to chlorhexidine/CHG or antibacterial soaps. If your skin becomes reddened or irritated, stop using the CHG and notify one of our RNs at [phone].              TAKE A SHOWER THE NIGHT BEFORE SURGERY AND THE DAY OF SURGERY    Please keep in mind the following:  DO NOT shave,  including legs and underarms, 48 hours prior to surgery.   You may shave your face before/day of surgery.  Place clean sheets on your bed the night before surgery Use a clean washcloth (not used since being washed) for each shower. DO NOT sleep with pet's night before surgery.  CHG Shower Instructions:  Wash your face and private area with normal soap. If you choose to wash your hair, wash first with your normal shampoo.  After you use shampoo/soap, rinse your hair and body thoroughly to remove shampoo/soap residue.  Turn the water OFF and apply half the bottle of CHG soap to a CLEAN washcloth.  Apply CHG soap ONLY FROM YOUR NECK DOWN TO YOUR  TOES (washing for 3-5 minutes)  DO NOT use CHG soap on face, private areas, open wounds, or sores.  Pay special attention to the area where your surgery is being performed.  If you are having back surgery, having someone wash your back for you may be helpful. Wait 2 minutes after CHG soap is applied, then you may rinse off the CHG soap.  Pat dry with a clean towel  Put on clean pajamas    Additional instructions for the day of surgery: DO NOT APPLY any lotions, deodorants, cologne, or perfumes.   Do not wear jewelry or makeup Do not wear nail polish, gel polish, artificial nails, or any other type of covering on natural nails (fingers and toes) Do not bring valuables to the hospital. [facility] is not responsible for valuables/personal belongings. Put on clean/comfortable clothes.  Please brush your teeth.  Ask your nurse before applying any prescription medications to the skin.

## 2023-07-02 ENCOUNTER — Ambulatory Visit (HOSPITAL_COMMUNITY)
Admission: RE | Admit: 2023-07-02 | Discharge: 2023-07-02 | Disposition: A | Discharge status: Home / Self Care | Payer: Medicaid Other | Attending: Neurosurgery | Admitting: Neurosurgery

## 2023-07-02 ENCOUNTER — Ambulatory Visit (HOSPITAL_COMMUNITY): Payer: Self-pay | Admitting: Physician Assistant

## 2023-07-02 ENCOUNTER — Ambulatory Visit (HOSPITAL_BASED_OUTPATIENT_CLINIC_OR_DEPARTMENT_OTHER): Payer: Medicaid Other | Admitting: Anesthesiology

## 2023-07-02 ENCOUNTER — Other Ambulatory Visit: Payer: Self-pay | Admitting: Neurosurgery

## 2023-07-02 ENCOUNTER — Encounter (HOSPITAL_COMMUNITY)
Admission: RE | Disposition: A | Discharge status: Home / Self Care | Payer: Self-pay | Source: Home / Self Care | Attending: Neurosurgery

## 2023-07-02 ENCOUNTER — Other Ambulatory Visit: Payer: Self-pay

## 2023-07-02 ENCOUNTER — Ambulatory Visit (HOSPITAL_COMMUNITY): Payer: Medicaid Other

## 2023-07-02 DIAGNOSIS — I10 Essential (primary) hypertension: Secondary | ICD-10-CM | POA: Insufficient documentation

## 2023-07-02 DIAGNOSIS — M5117 Intervertebral disc disorders with radiculopathy, lumbosacral region: Secondary | ICD-10-CM | POA: Insufficient documentation

## 2023-07-02 DIAGNOSIS — K219 Gastro-esophageal reflux disease without esophagitis: Secondary | ICD-10-CM | POA: Diagnosis not present

## 2023-07-02 DIAGNOSIS — M5416 Radiculopathy, lumbar region: Secondary | ICD-10-CM

## 2023-07-02 DIAGNOSIS — Z79899 Other long term (current) drug therapy: Secondary | ICD-10-CM | POA: Insufficient documentation

## 2023-07-02 DIAGNOSIS — J439 Emphysema, unspecified: Secondary | ICD-10-CM | POA: Diagnosis not present

## 2023-07-02 DIAGNOSIS — F17210 Nicotine dependence, cigarettes, uncomplicated: Secondary | ICD-10-CM | POA: Insufficient documentation

## 2023-07-02 DIAGNOSIS — J449 Chronic obstructive pulmonary disease, unspecified: Secondary | ICD-10-CM | POA: Insufficient documentation

## 2023-07-02 DIAGNOSIS — Z8249 Family history of ischemic heart disease and other diseases of the circulatory system: Secondary | ICD-10-CM | POA: Insufficient documentation

## 2023-07-02 HISTORY — PX: LUMBAR LAMINECTOMY/ DECOMPRESSION WITH MET-RX: SHX5959

## 2023-07-02 SURGERY — LUMBAR LAMINECTOMY/ DECOMPRESSION WITH MET-RX
Anesthesia: General | Site: Back | Laterality: Left

## 2023-07-02 MED ORDER — CHLORHEXIDINE GLUCONATE CLOTH 2 % EX PADS: 6.0000 | MEDICATED_PAD | Freq: Once | CUTANEOUS | Status: DC

## 2023-07-02 MED ORDER — PROPOFOL 10 MG/ML IV BOLUS
INTRAVENOUS | Status: AC
  Filled 2023-07-02: qty 20

## 2023-07-02 MED ORDER — MIDAZOLAM HCL 2 MG/2ML IJ SOLN: 0.5000 mg | Freq: Once | INTRAMUSCULAR | Status: DC | PRN

## 2023-07-02 MED ORDER — ROCURONIUM BROMIDE 10 MG/ML (PF) SYRINGE
PREFILLED_SYRINGE | INTRAVENOUS | Status: AC
  Filled 2023-07-02: qty 10

## 2023-07-02 MED ORDER — SUGAMMADEX SODIUM 200 MG/2ML IV SOLN
INTRAVENOUS | Status: DC | PRN
  Administered 2023-07-02: 200 mg via INTRAVENOUS

## 2023-07-02 MED ORDER — CHLORHEXIDINE GLUCONATE 0.12 % MT SOLN
OROMUCOSAL | Status: AC
  Administered 2023-07-02: 15 mL via OROMUCOSAL
  Filled 2023-07-02: qty 15

## 2023-07-02 MED ORDER — OXYCODONE HCL 5 MG PO TABS
5.0000 mg | ORAL_TABLET | Freq: Once | ORAL | Status: AC | PRN
  Administered 2023-07-02: 5 mg via ORAL

## 2023-07-02 MED ORDER — OXYCODONE HCL 5 MG/5ML PO SOLN: 5.0000 mg | Freq: Once | ORAL | Status: AC | PRN

## 2023-07-02 MED ORDER — ROCURONIUM BROMIDE 10 MG/ML (PF) SYRINGE
PREFILLED_SYRINGE | INTRAVENOUS | Status: DC | PRN
  Administered 2023-07-02: 60 mg via INTRAVENOUS

## 2023-07-02 MED ORDER — ACETAMINOPHEN 500 MG PO TABS
ORAL_TABLET | ORAL | Status: AC
  Filled 2023-07-02: qty 2

## 2023-07-02 MED ORDER — FENTANYL CITRATE (PF) 250 MCG/5ML IJ SOLN
INTRAMUSCULAR | Status: AC
  Filled 2023-07-02: qty 5

## 2023-07-02 MED ORDER — CEFAZOLIN SODIUM-DEXTROSE 2-4 GM/100ML-% IV SOLN
2.0000 g | INTRAVENOUS | Status: AC
  Administered 2023-07-02: 2 g via INTRAVENOUS

## 2023-07-02 MED ORDER — FENTANYL CITRATE (PF) 250 MCG/5ML IJ SOLN
INTRAMUSCULAR | Status: DC | PRN
  Administered 2023-07-02: 100 ug via INTRAVENOUS
  Administered 2023-07-02: 50 ug via INTRAVENOUS

## 2023-07-02 MED ORDER — METHYLPREDNISOLONE ACETATE 80 MG/ML IJ SUSP
INTRAMUSCULAR | Status: AC
  Filled 2023-07-02: qty 1

## 2023-07-02 MED ORDER — CEFAZOLIN SODIUM-DEXTROSE 2-4 GM/100ML-% IV SOLN
INTRAVENOUS | Status: AC
  Filled 2023-07-02: qty 100

## 2023-07-02 MED ORDER — LIDOCAINE 2% (20 MG/ML) 5 ML SYRINGE
INTRAMUSCULAR | Status: AC
  Filled 2023-07-02: qty 5

## 2023-07-02 MED ORDER — ONDANSETRON HCL 4 MG/2ML IJ SOLN
INTRAMUSCULAR | Status: DC | PRN
  Administered 2023-07-02: 4 mg via INTRAVENOUS

## 2023-07-02 MED ORDER — MIDAZOLAM HCL 2 MG/2ML IJ SOLN
INTRAMUSCULAR | Status: AC
  Filled 2023-07-02: qty 2

## 2023-07-02 MED ORDER — OXYCODONE-ACETAMINOPHEN 5-325 MG PO TABS: 1.0000 | ORAL_TABLET | ORAL | 0 refills | Status: DC | PRN

## 2023-07-02 MED ORDER — CHLORHEXIDINE GLUCONATE 0.12 % MT SOLN: 15.0000 mL | Freq: Once | OROMUCOSAL | Status: AC

## 2023-07-02 MED ORDER — EPHEDRINE SULFATE-NACL 50-0.9 MG/10ML-% IV SOSY
PREFILLED_SYRINGE | INTRAVENOUS | Status: DC | PRN
  Administered 2023-07-02: 5 mg via INTRAVENOUS

## 2023-07-02 MED ORDER — ORAL CARE MOUTH RINSE: 15.0000 mL | Freq: Once | OROMUCOSAL | Status: AC

## 2023-07-02 MED ORDER — BUPIVACAINE HCL (PF) 0.5 % IJ SOLN
INTRAMUSCULAR | Status: AC
  Filled 2023-07-02: qty 30

## 2023-07-02 MED ORDER — METHYLPREDNISOLONE ACETATE 80 MG/ML IJ SUSP
INTRAMUSCULAR | Status: DC | PRN
  Administered 2023-07-02: 40 mg

## 2023-07-02 MED ORDER — 0.9 % SODIUM CHLORIDE (POUR BTL) OPTIME
TOPICAL | Status: DC | PRN
  Administered 2023-07-02: 1000 mL

## 2023-07-02 MED ORDER — ACETAMINOPHEN 10 MG/ML IV SOLN
INTRAVENOUS | Status: DC | PRN
  Administered 2023-07-02: 1000 mg via INTRAVENOUS

## 2023-07-02 MED ORDER — METHOCARBAMOL 750 MG PO TABS: 750.0000 mg | ORAL_TABLET | Freq: Four times a day (QID) | ORAL | 0 refills | Status: AC | PRN

## 2023-07-02 MED ORDER — PHENYLEPHRINE HCL-NACL 20-0.9 MG/250ML-% IV SOLN
INTRAVENOUS | Status: DC | PRN
  Administered 2023-07-02: 25 ug/min via INTRAVENOUS

## 2023-07-02 MED ORDER — LIDOCAINE 2% (20 MG/ML) 5 ML SYRINGE
INTRAMUSCULAR | Status: DC | PRN
  Administered 2023-07-02: 100 mg via INTRAVENOUS

## 2023-07-02 MED ORDER — DEXAMETHASONE SODIUM PHOSPHATE 10 MG/ML IJ SOLN
INTRAMUSCULAR | Status: DC | PRN
  Administered 2023-07-02: 10 mg via INTRAVENOUS

## 2023-07-02 MED ORDER — DOCUSATE SODIUM 100 MG PO CAPS: 100.0000 mg | ORAL_CAPSULE | Freq: Two times a day (BID) | ORAL | 0 refills | Status: DC

## 2023-07-02 MED ORDER — MIDAZOLAM HCL 2 MG/2ML IJ SOLN
INTRAMUSCULAR | Status: DC | PRN
  Administered 2023-07-02: 2 mg via INTRAVENOUS

## 2023-07-02 MED ORDER — THROMBIN 5000 UNITS EX SOLR
CUTANEOUS | Status: AC
  Filled 2023-07-02: qty 5000

## 2023-07-02 MED ORDER — PROPOFOL 10 MG/ML IV BOLUS
INTRAVENOUS | Status: DC | PRN
  Administered 2023-07-02: 200 mg via INTRAVENOUS

## 2023-07-02 MED ORDER — HYDROMORPHONE HCL 1 MG/ML IJ SOLN
0.2500 mg | INTRAMUSCULAR | Status: DC | PRN
  Administered 2023-07-02: 0.25 mg via INTRAVENOUS

## 2023-07-02 MED ORDER — OXYCODONE HCL 5 MG PO TABS
ORAL_TABLET | ORAL | Status: AC
  Filled 2023-07-02: qty 1

## 2023-07-02 MED ORDER — THROMBIN 5000 UNITS EX SOLR: OROMUCOSAL | Status: DC | PRN

## 2023-07-02 MED ORDER — LIDOCAINE-EPINEPHRINE 1 %-1:100000 IJ SOLN
INTRAMUSCULAR | Status: DC | PRN
  Administered 2023-07-02: 3.5 mL

## 2023-07-02 MED ORDER — LACTATED RINGERS IV SOLN: INTRAVENOUS | Status: DC

## 2023-07-02 MED ORDER — MEPERIDINE HCL 25 MG/ML IJ SOLN: 6.2500 mg | INTRAMUSCULAR | Status: DC | PRN

## 2023-07-02 MED ORDER — HYDROMORPHONE HCL 1 MG/ML IJ SOLN
INTRAMUSCULAR | Status: AC
  Filled 2023-07-02: qty 1

## 2023-07-02 MED ORDER — BUPIVACAINE HCL (PF) 0.5 % IJ SOLN
INTRAMUSCULAR | Status: DC | PRN
  Administered 2023-07-02: 3.5 mL
  Administered 2023-07-02: 8 mL

## 2023-07-02 MED ORDER — LIDOCAINE-EPINEPHRINE 1 %-1:100000 IJ SOLN
INTRAMUSCULAR | Status: AC
  Filled 2023-07-02: qty 1

## 2023-07-02 SURGICAL SUPPLY — 51 items
BAG COUNTER SPONGE SURGICOUNT (BAG) ×2 IMPLANT
BAND RUBBER #18 3X1/16 STRL (MISCELLANEOUS) ×4 IMPLANT
BENZOIN TINCTURE PRP APPL 2/3 (GAUZE/BANDAGES/DRESSINGS) ×2 IMPLANT
BLADE CLIPPER SURG (BLADE) IMPLANT
BUR CARBIDE MATCH 3.0 (BURR) IMPLANT
BUR PRECISION FLUTE 5.0 (BURR) IMPLANT
BUR SURGICAL HEAD PROX 3X12.5 (BURR) IMPLANT
BURR SURGICAL HEAD PROX 3X12.5 (BURR) ×2
CANISTER SUCT 3000ML PPV (MISCELLANEOUS) ×2 IMPLANT
DERMABOND ADVANCED .7 DNX12 (GAUZE/BANDAGES/DRESSINGS) ×1 IMPLANT
DRAPE LAPAROTOMY 100X72X124 (DRAPES) ×2 IMPLANT
DRAPE MICROSCOPE SLANT 54X150 (MISCELLANEOUS) ×2 IMPLANT
DRAPE SURG 17X23 STRL (DRAPES) ×2 IMPLANT
DRESSING MEPILEX FLEX 4X4 (GAUZE/BANDAGES/DRESSINGS) IMPLANT
DRSG MEPILEX FLEX 4X4 (GAUZE/BANDAGES/DRESSINGS)
DRSG OPSITE POSTOP 3X4 (GAUZE/BANDAGES/DRESSINGS) ×2 IMPLANT
DURAPREP 26ML APPLICATOR (WOUND CARE) ×2 IMPLANT
ELECT BLADE INSULATED 4IN (ELECTROSURGICAL) ×2
ELECT REM PT RETURN 9FT ADLT (ELECTROSURGICAL) ×2
ELECTRODE BLADE INSULATED 4IN (ELECTROSURGICAL) ×1 IMPLANT
ELECTRODE REM PT RTRN 9FT ADLT (ELECTROSURGICAL) ×1 IMPLANT
GAUZE 4X4 16PLY ~~LOC~~+RFID DBL (SPONGE) IMPLANT
GLOVE BIO SURGEON STRL SZ7 (GLOVE) ×4 IMPLANT
GLOVE BIOGEL PI IND STRL 7.5 (GLOVE) ×6 IMPLANT
GLOVE ECLIPSE 7.5 STRL STRAW (GLOVE) ×2 IMPLANT
GLOVE EXAM NITRILE XL STR (GLOVE) IMPLANT
GOWN STRL REUS W/ TWL LRG LVL3 (GOWN DISPOSABLE) ×4 IMPLANT
GOWN STRL REUS W/ TWL XL LVL3 (GOWN DISPOSABLE) ×2 IMPLANT
GOWN STRL REUS W/TWL 2XL LVL3 (GOWN DISPOSABLE) IMPLANT
HEMOSTAT POWDER KIT SURGIFOAM (HEMOSTASIS) ×2 IMPLANT
KIT BASIN OR (CUSTOM PROCEDURE TRAY) ×2 IMPLANT
KIT TURNOVER KIT B (KITS) ×2 IMPLANT
NDL HYPO 22X1.5 SAFETY MO (MISCELLANEOUS) ×1 IMPLANT
NDL SPNL 18GX3.5 QUINCKE PK (NEEDLE) IMPLANT
NEEDLE HYPO 22X1.5 SAFETY MO (MISCELLANEOUS) ×2
NEEDLE SPNL 18GX3.5 QUINCKE PK (NEEDLE)
NS IRRIG 1000ML POUR BTL (IV SOLUTION) ×2 IMPLANT
PACK LAMINECTOMY NEURO (CUSTOM PROCEDURE TRAY) ×2 IMPLANT
PAD ARMBOARD 7.5X6 YLW CONV (MISCELLANEOUS) ×6 IMPLANT
SPIKE FLUID TRANSFER (MISCELLANEOUS) ×2 IMPLANT
SPONGE SURGIFOAM ABS GEL SZ50 (HEMOSTASIS) IMPLANT
SPONGE T-LAP 4X18 ~~LOC~~+RFID (SPONGE) IMPLANT
STRIP CLOSURE SKIN 1/2X4 (GAUZE/BANDAGES/DRESSINGS) ×2 IMPLANT
SUT MNCRL AB 4-0 PS2 18 (SUTURE) ×2 IMPLANT
SUT VIC AB 0 CT1 18XCR BRD8 (SUTURE) ×2 IMPLANT
SUT VIC AB 2-0 CP2 18 (SUTURE) ×2 IMPLANT
SUT VIC AB 3-0 SH 8-18 (SUTURE) ×1 IMPLANT
SYR 3ML LL SCALE MARK (SYRINGE) IMPLANT
TOWEL GREEN STERILE (TOWEL DISPOSABLE) ×2 IMPLANT
TOWEL GREEN STERILE FF (TOWEL DISPOSABLE) ×2 IMPLANT
WATER STERILE IRR 1000ML POUR (IV SOLUTION) ×2 IMPLANT

## 2023-07-02 NOTE — Therapy (Signed)
 OUTPATIENT PHYSICAL THERAPY THORACOLUMBAR EVALUATION   Patient Name: James [name] MRN: [id number] DOB:03-Jan-1976, 48 y.o., male Today's Date: 07/04/2023  END OF SESSION:  PT End of Session - 07/04/23 1435     Visit Number 1    Number of Visits 12    Date for PT Re-Evaluation 09/01/23    Authorization Type MCD    PT Start Time 1400    PT Stop Time 1445    PT Time Calculation (min) 45 min    Activity Tolerance Patient tolerated treatment well;Patient limited by pain    Behavior During Therapy [facility] for tasks assessed/performed             Past Medical History:  Diagnosis Date   Alcohol dependence with uncomplicated withdrawal (HCC) 06/07/2017   Alcohol use disorder, moderate, dependence (HCC) 04/06/2018   Asthma    Black stools    Bronchitis    11/2011   COPD (chronic obstructive pulmonary disease) (HCC)    Difficulty sleeping    Dizziness    Emphysema of lung (HCC)    ETOH abuse    Sober since 03/2023   Former smoker    GERD (gastroesophageal reflux disease)    Hypertension goal BP (blood pressure) < 140/90 12/21/2011   Hypokalemia 12/28/2019   Neurofibromatosis, type 1 (HCC)    diagnosed 2013   Neuromuscular disorder (HCC) 2014   neurofibrosis   Shortness of breath    with exertion   Vomiting blood    Past Surgical History:  Procedure Laterality Date   COLONOSCOPY  2023   Dr. Trish   ESOPHAGOGASTRODUODENOSCOPY (EGD) WITH PROPOFOL   05/14/2012   Procedure: ESOPHAGOGASTRODUODENOSCOPY (EGD) WITH PROPOFOL ;  Surgeon: Elsie Cree, MD;  Location: [facility];  Service: Endoscopy;  Laterality: N/A;   LUMBAR LAMINECTOMY/ DECOMPRESSION WITH MET-RX Left 07/02/2023   Procedure: Left Lumbar five-Sacral one Minimally Invasive Surgery microdiscectomy;  Surgeon: Debby Dorn MATSU, MD;  Location: [facility] OR;  Service: Neurosurgery;  Laterality: Left;  C3   UPPER GASTROINTESTINAL ENDOSCOPY     Patient Active Problem List   Diagnosis Date Noted   Alcohol abuse 12/05/2021    Blurred vision, bilateral 07/24/2021   Chronic tension-type headache, not intractable 09/28/2020   Depression, recurrent (HCC) 10/05/2019   Gastroesophageal reflux disease 04/06/2018   COPD, mild (HCC) 10/24/2016   Chronic cough 09/17/2014   Tobacco use disorder 05/11/2014   Thoracic spine tumor 04/13/2014   Vitamin D  insufficiency 06/25/2013   Backache 03/17/2012   Hypertension 12/21/2011   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    PERTINENT HISTORY:  L5-S1 microdiskectomy 07/01/23  PAIN:  Are you having pain? Yes: NPRS scale: 9/10 Pain location: low back Pain description: ache, sore Aggravating factors: activity   Relieving factors: meds and rest   PRECAUTIONS: Back  RED FLAGS: None   WEIGHT BEARING RESTRICTIONS: No  FALLS:  Has patient fallen in last 6 months? No  OCCUPATION: not working  PLOF: Independent  PATIENT GOALS: To manage my low back pain  NEXT MD VISIT: 07/22/23  OBJECTIVE:  Note: Objective measures were completed at Evaluation unless otherwise noted.  DIAGNOSTIC FINDINGS:  none  PATIENT SURVEYS:  Modified Oswestry 19/50 38% perceived disability   MUSCLE LENGTH: Hamstrings: B restrictions due to guarding   POSTURE: rounded shoulders, decreased lumbar lordosis, and flexed trunk   PALPATION: Deferred post op, surgical  site inspected, honeycomb dressing in place, no redness, warmth or drainage detected.  LUMBAR ROM: deferred due to post-op status and pain  AROM eval  Flexion   Extension   Right lateral flexion   Left lateral flexion   Right rotation   Left rotation    (Blank rows = not tested)  LOWER EXTREMITY ROM:   WFL for transfers, bed mobility and gait  Active  Right eval Left eval  Hip flexion    Hip extension    Hip abduction    Hip adduction    Hip internal rotation    Hip external rotation    Knee flexion    Knee extension    Ankle dorsiflexion    Ankle plantarflexion    Ankle inversion    Ankle eversion     (Blank rows = not tested)  LOWER EXTREMITY MMT:    MMT Right eval Left eval  Hip flexion    Hip extension    Hip abduction    Hip adduction    Hip internal rotation    Hip external rotation    Knee flexion    Knee extension    Ankle dorsiflexion    Ankle plantarflexion    Ankle inversion    Ankle eversion     (Blank rows = not tested)  LUMBAR SPECIAL TESTS:  Slump test: Negative  FUNCTIONAL TESTS:  30 seconds chair stand test 1 rep with UE support  GAIT: Distance walked: 44ftx2 Assistive device utilized: None Level of assistance: Complete Independence Comments: slow cadence with antalgic gait  TREATMENT DATE:  [facility] Adult PT Treatment:                                                DATE: 07/04/23 Evaluation Self Care: Discussion and demonstration of transfer technique, log roll and proper sitting posture.  PATIENT EDUCATION:  Education details: Discussed eval findings, rehab rationale and POC and patient is in agreement  Person educated: Patient Education method: Explanation Education comprehension: verbalized understanding and needs further education  HOME EXERCISE PROGRAM: TBD  ASSESSMENT:  CLINICAL  IMPRESSION: Patient is a 48 y.o. male who was seen today for physical therapy evaluation and treatment for low back pain and postural dysfunction following microdiskectomy 07/02/23. Guarding present throughout.  Scope of assessment limited by pain and respect for post-op status.  Review of proper transfer technique, log roll and proper sitting posture to minimize stress on spine.  OBJECTIVE IMPAIRMENTS: Abnormal gait, decreased activity tolerance, decreased endurance, decreased knowledge of condition, decreased mobility, difficulty walking, decreased ROM, decreased strength, impaired perceived functional ability, improper body mechanics, postural dysfunction, and pain.   ACTIVITY LIMITATIONS: carrying, lifting, bending, sitting, standing, squatting, sleeping, transfers, and bed mobility  PERSONAL FACTORS: Age, Fitness, and 1 comorbidity: COPD/Asthma  are also affecting patient's functional outcome.   REHAB POTENTIAL: Good  CLINICAL DECISION MAKING: Evolving/moderate complexity  EVALUATION COMPLEXITY: Low   GOALS: Goals reviewed with patient? No  SHORT TERM GOALS: Target date: 07/25/2023   Patient to demonstrate independence in HEP  Baseline: TBD Goal status: INITIAL  2.  Patient to perform one STS transfer w/o UE support  Baseline:  Goal status: INITIAL    LONG TERM GOALS: Target date: 08/15/2023  Patient will acknowledge 6/10 worst pain at least once during episode of care   Baseline: 9/10 Goal status: INITIAL  2.  Patient will increase 30s chair stand reps from 1 to 8 with/without arms to demonstrate and improved functional ability with less pain/difficulty as well as reduce fall risk.  Baseline: 1 Goal status: INITIAL  3.  Patient will score at least 11/50 on ODI to signify clinically meaningful improvement in functional abilities.   Baseline: 19/50 Goal status: INITIAL  4.  Patient to demonstrate 75% trunk AROM when at appropriate post-op interval.  Baseline:  Goal  status: INITIAL    PLAN:  PT FREQUENCY: 1-2x/week  PT DURATION: 6 weeks  PLANNED INTERVENTIONS: 97164- PT Re-evaluation, 97110-Therapeutic exercises, 97530- Therapeutic activity, 97112- Neuromuscular re-education, 97535- Self Care, [id number]- Manual therapy, [id number]- Gait training, and Spinal mobilization.  PLAN FOR NEXT SESSION: HEP review and update, manual techniques as appropriate, aerobic tasks, ROM and flexibility activities, strengthening and PREs, TPDN, gait and balance training as needed    For all possible CPT codes, reference the Planned Interventions line above.     Check all conditions that are expected to impact treatment: {Conditions expected to impact treatment:Respiratory disorders and Social determinants of health   If treatment provided at initial evaluation, no treatment charged due to lack of authorization.       P[place]idon Pam M Manases [place], PT 07/04/2023, 2:36 PM

## 2023-07-02 NOTE — Anesthesia Procedure Notes (Addendum)
 Procedure Name: Intubation Date/Time: 07/02/2023 3:37 PM  Performed by: Brittan Mapel J, CRNAPre-anesthesia Checklist: Patient identified, Emergency Drugs available, Suction available and Patient being monitored Patient Re-evaluated:Patient Re-evaluated prior to induction Oxygen Delivery Method: Circle System Utilized Preoxygenation: Pre-oxygenation with 100% oxygen Induction Type: IV induction Ventilation: Mask ventilation without difficulty Laryngoscope Size: Glidescope and 4 Grade View: Grade I Tube type: Oral Tube size: 7.5 mm Number of attempts: 1 Airway Equipment and Method: Stylet and Oral airway Placement Confirmation: ETT inserted through vocal cords under direct vision, positive ETCO2 and breath sounds checked- equal and bilateral Secured at: 23 cm Tube secured with: Tape Dental Injury: Teeth and Oropharynx as per pre-operative assessment

## 2023-07-02 NOTE — H&P (Signed)
 CC: left lumbar radiculopathy  HPI:     Patient is a 48 y.o. male presents with left lumbar radiculopathy refractory to medical and physical therapy.  He was found to have a large left paracentral disc herniation.    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   Past Medical History:  Diagnosis Date   Alcohol dependence with uncomplicated withdrawal (HCC) 06/07/2017   Alcohol use disorder, moderate, dependence (HCC) 04/06/2018   Asthma    Black stools    Bronchitis    11/2011   COPD (chronic obstructive pulmonary disease) (HCC)    Difficulty sleeping    Dizziness    Emphysema of lung (HCC)    ETOH abuse    Sober since 03/2023   Former smoker    GERD (gastroesophageal reflux disease)    Hypertension goal BP (blood pressure) < 140/90 12/21/2011   Hypokalemia 12/28/2019   Neurofibromatosis, type 1 (HCC)    diagnosed 2013   Neuromuscular disorder (HCC) 2014   neurofibrosis   Shortness of breath    with exertion   Vomiting blood     Past Surgical History:  Procedure Laterality Date   COLONOSCOPY  2023   Dr. Trish   ESOPHAGOGASTRODUODENOSCOPY (EGD) WITH PROPOFOL   05/14/2012   Procedure: ESOPHAGOGASTRODUODENOSCOPY (EGD) WITH PROPOFOL ;  Surgeon: Elsie Cree, MD;  Location: [facility];  Service: Endoscopy;  Laterality: N/A;   UPPER GASTROINTESTINAL ENDOSCOPY      Medications Prior to Admission  Medication Sig Dispense Refill Last Dose/Taking   amLODipine -olmesartan (AZOR) 5-20 MG  tablet Take 1 tablet by mouth daily.   07/01/2023   atorvastatin (LIPITOR) 10 MG tablet Take 10 mg by mouth daily.   07/02/2023 at  8:30 AM   cyclobenzaprine  (FLEXERIL ) 5 MG tablet Take 1 tablet at bedtime as needed for 1 week, if tolerating you can take 1 tablet twice daily as needed. (Patient taking differently: Take 5 mg by mouth daily as needed for muscle spasms. Take 1 tablet at bedtime as needed for 1 week, if tolerating you can take 1 tablet twice daily as needed.) 60 tablet 5 Past Week   gabapentin  (NEURONTIN ) 300 MG capsule Take 300mg  in the morning and 600mg  at bedtime x 1 week, then 600mg  twice daily. (Patient taking differently: Take 300 mg by mouth at bedtime.) 120 capsule 3 Past Week   ibuprofen  (ADVIL ) 600 MG tablet TAKE 1 TABLET BY MOUTH EVERY 8 HOURS AS NEEDED (Patient taking differently: Take 600 mg by mouth in the morning and at bedtime.) 60 tablet 1 Past Week   STIOLTO RESPIMAT  2.5-2.5 MCG/ACT AERS Inhale 2 puffs into the lungs daily.   07/02/2023 at  8:30 AM   VENTOLIN  HFA 108 (90 Base) MCG/ACT inhaler TAKE 2 PUFFS BY MOUTH EVERY 6 HOURS AS NEEDED FOR WHEEZE OR SHORTNESS OF BREATH 18 each 1 07/01/2023   buPROPion  (WELLBUTRIN  XL) 150 MG 24 hr tablet TAKE 1 TABLET BY MOUTH EVERY DAY (Patient not taking: Reported on 06/26/2023) 90 tablet 1 Not Taking   famotidine  (PEPCID ) 20 MG tablet Take  1 tablet (20 mg total) by mouth 2 (two) times daily. (Patient not taking: Reported on 06/26/2023) 30 tablet 0 Not Taking   Fluticasone -Umeclidin-Vilant (TRELEGY ELLIPTA ) 100-62.5-25 MCG/ACT AEPB Inhale 1 puff into the lungs daily. (Patient not taking: Reported on 06/26/2023) 60 each 5 Not Taking   ondansetron  (ZOFRAN ) 4 MG tablet Take 1 tablet (4 mg total) by mouth every 6 (six) hours. (Patient not taking: Reported on 06/26/2023) 30 tablet 0 Not Taking   No Known Allergies  Social History   Tobacco Use   Smoking status: Every Day    Current packs/day: 0.50    Average packs/day: 0.5 packs/day for 21.1 years  (10.5 ttl pk-yrs)    Types: Cigarettes    Start date: 05/28/2002   Smokeless tobacco: Never   Tobacco comments:    tobacco infor given 04/01/18  Substance Use Topics   Alcohol use: Not Currently    Comment: on special occasion    Family History  Problem Relation Age of Onset   Diabetes Mother    Hypertension Mother    Colon cancer Father    Hypertension Father    Diabetes Father    Prostate cancer Father    Neurofibromatosis Brother        deceased   Neurofibromatosis Daughter    Neurofibromatosis Daughter    Esophageal cancer Neg Hx    Rectal cancer Neg Hx    Inflammatory bowel disease Neg Hx    Liver disease Neg Hx    Pancreatic cancer Neg Hx    Stomach cancer Neg Hx    Colon polyps Neg Hx      Review of Systems Pertinent items are noted in HPI.  Objective:   Patient Vitals for the past 8 hrs:  BP Temp Temp src Pulse Resp SpO2 Height Weight  07/02/23 1137 (!) 132/97 98.9 F (37.2 C) Oral 89 18 96 % 5' 6 (1.676 m) 81.1 kg   No intake/output data recorded. No intake/output data recorded.      General : Alert, cooperative, no distress, appears stated age   Head:  Normocephalic/atraumatic    Eyes: PERRL, conjunctiva/corneas clear, EOM's intact. Fundi could not be visualized Neck: Supple Chest:  Respirations unlabored Chest wall: no tenderness or deformity Heart: Regular rate and rhythm Abdomen: Soft, nontender and nondistended Extremities: warm and well-perfused Skin: normal turgor, color and texture Neurologic:  Alert, oriented x 3.  Eyes open spontaneously. PERRL, EOMI, VFC, no facial droop. V1-3 intact.  No dysarthria, tongue protrusion symmetric.  CNII-XII intact. Normal strength, sensation and reflexes throughout.  No pronator drift, full strength in legs. L SLR positive       Data ReviewCBC:  Lab Results  Component Value Date   WBC 8.5 07/01/2023   RBC 4.80 07/01/2023   BMP:  Lab Results  Component Value Date   GLUCOSE 97 07/01/2023   CO2 24  07/01/2023   BUN 8 07/01/2023   BUN 10 07/24/2021   CREATININE 0.93 07/01/2023   CREATININE 0.90 05/03/2016   CALCIUM 9.6 07/01/2023   Radiology review:  See clinic note for details  Assessment:   Active Problems:   * No active hospital problems. *  48 yo M with left paracentral disc herniation, associated radiculopathy   Plan:   - plan for L L5-S1 MIS microdiscectomy - Risks, benefits, alternatives, and expected convalescence were discussed with her.  Risks discussed included, but were not limited to bleeding, pain, infection, scar, spinal fluid leak, neurologic deficit, instability,  damage to nearby  organs, and death.  Informed consent was obtained.

## 2023-07-02 NOTE — Transfer of Care (Signed)
 Immediate Anesthesia Transfer of Care Note  Patient: James [name]  Procedure(s) Performed: Left Lumbar five-Sacral one Minimally Invasive Surgery microdiscectomy (Left: Back)  Patient Location: PACU  Anesthesia Type:General  Level of Consciousness: awake, alert , and oriented  Airway & Oxygen Therapy: Patient Spontanous Breathing and Patient connected to face mask oxygen  Post-op Assessment: Report given to RN and Post -op Vital signs reviewed and stable  Post vital signs: Reviewed and stable  Last Vitals:  Vitals Value Taken Time  BP 126/76 07/02/23 1703  Temp 36.5 C 07/02/23 1703  Pulse 85 07/02/23 1709  Resp 24 07/02/23 1709  SpO2 93 % 07/02/23 1709  Vitals shown include unfiled device data.  Last Pain:  Vitals:   07/02/23 1224  TempSrc:   PainSc: 10-Worst pain ever      Patients Stated Pain Goal: 1 (07/02/23 1224)  Complications: No notable events documented.

## 2023-07-02 NOTE — Op Note (Signed)
 PREOP DIAGNOSIS: Herniated nucleus pulposus at L5-S1 with left radiculopathy  POSTOP DIAGNOSIS: Herniated nucleus pulposus at L5-S1 with left radiculopathy  PROCEDURE: 1. Left L5-S1  laminotomy, medial facetectomy for excision of herniated disc using tubular retractor system 2.  Use of microscope for microdissection  SURGEON: Dr. Dorn Ned, MD  ASSISTANT: Camie Pickle, PA.   Please note, there were no qualified trainees available to assist with the procedure.  An assistant was required for aid in retraction of the neural elements.   ANESTHESIA: General Endotracheal  EBL: 25 ml  SPECIMENS: None  DRAINS: None  COMPLICATIONS: none  CONDITION: Stable to PCAU  HISTORY: James [name] is a 48 y.o. male who initially presented to the outpatient clinic with lumbar radiculopathy. MRI showed a large left paracentral disc herniation.  .  The patient had failed nonsurgical management.  Therefore, microdiscectomy was offered to the patient.  Risks, benefits, alternatives, and expected convalescence were discussed.  Risks discussed included, but were not limited to, bleeding, pain, infection, scar, recurrent disc, instability, CSF leak, weakness, numbness, paralysis, and death.  T informed consent was obtained and the patient wished to proceed.  PROCEDURE IN DETAIL: The patient was brought to the operating room. After induction of general anesthesia, the patient was positioned on the operative table in the prone position on a Wilson frame with all pressure points meticulously padded. The skin of the low back was then prepped and draped in the usual sterile fashion.  Under fluoroscopy, the correct levels were identified and marked out on the skin, and after timeout was conducted, the skin was infiltrated with local anesthetic.  Paramedian skin incision was then made sharply over the affected level and the subcutaneous tissue and fascia were incised.  Initial dilator was then passed to the  interspace under fluoroscopic guidance.  The paraspinous muscle attachments were dissected from the left L5 lamina using the dilators.  Successive dilators were used until a 20 mm tubular retractor was placed under fluoroscopic guidance and locked in place with an attachment arm.  Microscope was then introduced into the field.  Small amount of remaining musculature was dissected from the lamina and the interspace was visualized as well.   High-speed drill was used to perform a laminotomy.  The ligamentum flavum was then removed with rongeurs.  The thecal sac and traversing nerve root were identified.  The epidural disc space was dissected with a 4 Penfield.  Upon retracting the nerve root medially, the large disc herniation was encountered.  The disc herniation was incised with a 15 blade and pituitary rongeur was used to remove the large disc fragment.  Smaller additional fragments were also removed.  Following removal of the herniated disc, the nerve root appeared much more relaxed.  Woodson probe was used to ensure good decompression and there was no longer significant's impingement of the nerve roots.  Meticulous hemostasis was obtained.  The wound was irrigated thoroughly with bacitracin impregnated irrigation.  Depo-Medrol  was then placed over the nerve root.  The tubular retractor was then withdrawn with hemostasis in the muscle obtained with bipolar.  The muscles were injected with half percent Marcaine .  The fascia was closed with 0 Vicryl stitches.  The dermal layer was closed with 2-0 Vicryl stitches in buried interrupted fashion.  The skin was closed with 4-0 Monocryl in subcuticular manner followed by benzoin and Steri-Strips.  A sterile dressing was placed.  Patient was then flipped supine and extubated by the anesthesia service.  All counts were  correct at the end of surgery.  No complications were noted.

## 2023-07-03 ENCOUNTER — Encounter (HOSPITAL_COMMUNITY): Payer: Self-pay | Admitting: Neurosurgery

## 2023-07-03 NOTE — Anesthesia Postprocedure Evaluation (Signed)
 Anesthesia Post Note  Patient: James [name]  Procedure(s) Performed: Left Lumbar five-Sacral one Minimally Invasive Surgery microdiscectomy (Left: Back)     Patient location during evaluation: PACU Anesthesia Type: General Level of consciousness: awake and alert Pain management: pain level controlled Vital Signs Assessment: post-procedure vital signs reviewed and stable Respiratory status: spontaneous breathing, nonlabored ventilation, respiratory function stable and patient connected to nasal cannula oxygen Cardiovascular status: blood pressure returned to baseline and stable Postop Assessment: no apparent nausea or vomiting Anesthetic complications: no   No notable events documented.  Last Vitals:  Vitals:   07/02/23 1745 07/02/23 1800  BP: 127/85 121/85  Pulse: 76 77  Resp: 13 14  Temp:  (!) 36.4 C  SpO2: 94% 95%    Last Pain:  Vitals:   07/02/23 1800  TempSrc:   PainSc: 3                  James [name]

## 2023-07-04 ENCOUNTER — Other Ambulatory Visit: Payer: Self-pay

## 2023-07-04 ENCOUNTER — Ambulatory Visit: Payer: Medicaid Other | Attending: Neurosurgery

## 2023-07-04 DIAGNOSIS — M5459 Other low back pain: Secondary | ICD-10-CM | POA: Insufficient documentation

## 2023-07-04 DIAGNOSIS — M5126 Other intervertebral disc displacement, lumbar region: Secondary | ICD-10-CM | POA: Insufficient documentation

## 2023-07-04 DIAGNOSIS — M6281 Muscle weakness (generalized): Secondary | ICD-10-CM | POA: Insufficient documentation

## 2023-07-08 NOTE — Therapy (Addendum)
 OUTPATIENT PHYSICAL THERAPY DISCHARGE SUMMARY   Patient Name: James [name] MRN: [id number] DOB:[birth year], 48 y.o., male Today's Date: 07/09/2023   Patient agrees to discharge. Patient goals were not met. Patient is being discharged due to not returning since the last visit.  END OF SESSION:  PT End of Session - 07/09/23 1424     Visit Number 2    Number of Visits 12    Date for PT Re-Evaluation 09/01/23    Authorization Type MCD    Authorization Time Period 6 visits 07/08/23-09/05/23    Authorization - Visit Number 2    Authorization - Number of Visits 6    PT Start Time 1400    PT Stop Time 1440    PT Time Calculation (min) 40 min    Activity Tolerance Patient tolerated treatment well    Behavior During Therapy [facility] for tasks assessed/performed              Past Medical History:  Diagnosis Date   Alcohol dependence with uncomplicated withdrawal (HCC) 06/07/2017   Alcohol use disorder, moderate, dependence (HCC) 04/06/2018   Asthma    Black stools    Bronchitis    11/2011   COPD (chronic obstructive pulmonary disease) (HCC)    Difficulty sleeping    Dizziness    Emphysema of lung (HCC)    ETOH abuse    Sober since 03/2023   Former smoker    GERD (gastroesophageal reflux disease)    Hypertension goal BP (blood pressure) < 140/90 12/21/2011   Hypokalemia 12/28/2019   Neurofibromatosis, type 1 (HCC)    diagnosed 2013   Neuromuscular disorder (HCC) 2014   neurofibrosis   Shortness of breath    with exertion   Vomiting blood    Past Surgical History:  Procedure Laterality Date   COLONOSCOPY  2023   Dr. Murlean Iba   ESOPHAGOGASTRODUODENOSCOPY (EGD) WITH PROPOFOL  05/14/2012   Procedure: ESOPHAGOGASTRODUODENOSCOPY (EGD) WITH PROPOFOL;  Surgeon: Willis Modena, MD;  Location: [facility];  Service: Endoscopy;  Laterality: N/A;   LUMBAR LAMINECTOMY/ DECOMPRESSION WITH MET-RX Left 07/02/2023   Procedure: Left Lumbar five-Sacral one Minimally Invasive Surgery  microdiscectomy;  Surgeon: Bedelia Person, MD;  Location: [facility] OR;  Service: Neurosurgery;  Laterality: Left;  C3   UPPER GASTROINTESTINAL ENDOSCOPY     Patient Active Problem List   Diagnosis Date Noted   Alcohol abuse 12/05/2021   Blurred vision, bilateral 07/24/2021   Chronic tension-type headache, not intractable 09/28/2020   Depression, recurrent (HCC) 10/05/2019   Gastroesophageal reflux disease 04/06/2018   COPD, mild (HCC) 10/24/2016   Chronic cough 09/17/2014   Tobacco use disorder 05/11/2014   Thoracic spine tumor 04/13/2014   Vitamin D insufficiency 06/25/2013   Backache 03/17/2012   Hypertension 12/21/2011   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  Moving easier, overall pain and guarding has eased off  PERTINENT HISTORY:  L5-S1 microdiskectomy 07/01/23  PAIN:  Are you having pain? Yes: NPRS scale: 9/10 Pain location: low back Pain description: ache, sore Aggravating factors: activity   Relieving factors: meds and rest   PRECAUTIONS: Back  RED FLAGS: None   WEIGHT BEARING RESTRICTIONS: No  FALLS:  Has patient fallen in last 6 months? No  OCCUPATION: not working  PLOF: Independent  PATIENT GOALS: To manage my low back pain  NEXT MD VISIT: 07/22/23  OBJECTIVE:  Note: Objective measures were completed at Evaluation unless otherwise  noted.  DIAGNOSTIC FINDINGS:  none  PATIENT SURVEYS:  Modified Oswestry 19/50 38% perceived disability   MUSCLE LENGTH: Hamstrings: B restrictions due to guarding   POSTURE: rounded shoulders, decreased lumbar lordosis, and flexed trunk   PALPATION: Deferred post op, surgical site inspected, honeycomb dressing in place, no redness, warmth or drainage detected.  LUMBAR ROM: deferred due to post-op status and pain  AROM eval  Flexion   Extension   Right lateral flexion   Left lateral flexion   Right rotation   Left rotation    (Blank rows = not tested)  LOWER EXTREMITY ROM:   WFL for transfers, bed mobility and gait  Active  Right eval Left eval  Hip flexion    Hip extension    Hip abduction    Hip adduction    Hip internal rotation    Hip external rotation    Knee flexion    Knee extension    Ankle dorsiflexion    Ankle plantarflexion    Ankle inversion    Ankle eversion     (Blank rows = not tested)  LOWER EXTREMITY MMT:    MMT Right eval Left eval  Hip flexion    Hip extension    Hip abduction    Hip adduction    Hip internal rotation    Hip external rotation    Knee flexion    Knee extension    Ankle dorsiflexion    Ankle plantarflexion    Ankle inversion    Ankle eversion     (Blank rows = not tested)  LUMBAR SPECIAL TESTS:  Slump test: Negative  FUNCTIONAL TESTS:  30 seconds chair stand test 1 rep with UE support  GAIT: Distance walked: 53ftx2 Assistive device utilized: None Level of assistance: Complete Independence Comments: slow cadence with antalgic gait  TREATMENT DATE:  [facility] Adult PT Treatment:                                                DATE: 07/09/23 Therapeutic Exercise: Seated hamstring stretch 30s x2 B Nustep L2 8 min Prone on elbows 2 min  Therapeutic Activity: PPT 3s 10x PPT with alt march 10/10 90/90 30s x2 DKTC over p-ball 15x STS 5x focus on neutral spine Seated lat press with breathing 10x Seated lat  press with alt FAQs 10/10  [facility] Adult PT Treatment:                                                DATE: 07/04/23 Evaluation Self Care: Discussion and demonstration of transfer technique, log roll  and proper sitting posture.                                                                                                                                PATIENT EDUCATION:  Education details: Discussed eval findings, rehab rationale and POC and patient is in agreement  Person educated: Patient Education method: Explanation Education comprehension: verbalized understanding and needs further education  HOME EXERCISE PROGRAM: Access Code: [email or web address] URL: [email or web address] Date: 07/09/2023 Prepared by: Gustavus Bryant  Exercises - Seated Table Hamstring Stretch  - 2 x daily - 5 x weekly - 1 sets - 2 reps - 30s hold - Static Prone on Elbows  - 2 x daily - 5 x weekly - 1 sets - 1 reps - 2 min hold - Sit to Stand with Arms Crossed  - 2 x daily - 5 x weekly - 1 sets - 5 reps - Supine Posterior Pelvic Tilt  - 2 x daily - 5 x weekly - 1 sets - 10 reps - 3s hold  ASSESSMENT:  CLINICAL IMPRESSION: patient returns for first f/u session.  Has obtained prescription meds and feeling much better.  Focus of today was stretching, core strengthening, aerobic work and Civil engineer, contracting.  HEP issued.  (Eval)Patient is a 48 y.o. male who was seen today for physical therapy evaluation and treatment for low back pain and postural dysfunction following microdiskectomy 07/02/23. Guarding present throughout.  Scope of assessment limited by pain and respect for post-op status.  Review of proper transfer technique, log roll and proper sitting posture to minimize stress on spine.  OBJECTIVE IMPAIRMENTS: Abnormal gait, decreased activity tolerance, decreased endurance, decreased knowledge of condition, decreased mobility, difficulty walking, decreased ROM, decreased strength, impaired perceived  functional ability, improper body mechanics, postural dysfunction, and pain.   ACTIVITY LIMITATIONS: carrying, lifting, bending, sitting, standing, squatting, sleeping, transfers, and bed mobility  PERSONAL FACTORS: Age, Fitness, and 1 comorbidity: COPD/Asthma  are also affecting patient's functional outcome.   REHAB POTENTIAL: Good  CLINICAL DECISION MAKING: Evolving/moderate complexity  EVALUATION COMPLEXITY: Low   GOALS: Goals reviewed with patient? No  SHORT TERM GOALS: Target date: 07/25/2023   Patient to demonstrate independence in HEP  Baseline: TBD; 07/09/23 [email or web address] Goal status: INITIAL  2.  Patient to perform one STS transfer w/o UE support  Baseline: 07/09/23 5x Goal status: Met    LONG TERM GOALS: Target date: 08/15/2023  Patient will acknowledge 6/10 worst pain at least once during episode of care   Baseline: 9/10 Goal status: INITIAL  2.  Patient will increase 30s chair stand reps from 1 to 8 with/without arms to demonstrate and improved functional ability with less pain/difficulty as well as reduce fall risk.  Baseline: 1 Goal status: INITIAL  3.  Patient will score at least 11/50 on ODI to signify clinically meaningful improvement in functional abilities.   Baseline: 19/50 Goal status: INITIAL  4.  Patient to demonstrate 75% trunk AROM  when at appropriate post-op interval.  Baseline:  Goal status: INITIAL    PLAN:  PT FREQUENCY: 1-2x/week  PT DURATION: 6 weeks  PLANNED INTERVENTIONS: 97164- PT Re-evaluation, 97110-Therapeutic exercises, 97530- Therapeutic activity, [id number]- Neuromuscular re-education, 97535- Self Care, 24401- Manual therapy, [id number]- Gait training, and Spinal mobilization.  PLAN FOR NEXT SESSION: HEP review and update, manual techniques as appropriate, aerobic tasks, ROM and flexibility activities, strengthening and PREs, TPDN, gait and balance training as needed    For all possible CPT codes, reference the Planned Interventions line  above.     Check all conditions that are expected to impact treatment: {Conditions expected to impact treatment:Respiratory disorders and Social determinants of health   If treatment provided at initial evaluation, no treatment charged due to lack of authorization.       Hildred Laser, PT 07/09/2023, 2:47 PM

## 2023-07-09 ENCOUNTER — Ambulatory Visit: Payer: Medicaid Other

## 2023-07-09 DIAGNOSIS — M6281 Muscle weakness (generalized): Secondary | ICD-10-CM

## 2023-07-09 DIAGNOSIS — M5459 Other low back pain: Secondary | ICD-10-CM | POA: Diagnosis not present

## 2023-07-09 DIAGNOSIS — M5126 Other intervertebral disc displacement, lumbar region: Secondary | ICD-10-CM

## 2023-07-11 ENCOUNTER — Ambulatory Visit: Payer: Medicaid Other

## 2023-07-11 NOTE — Therapy (Incomplete)
OUTPATIENT PHYSICAL THERAPY TREATMENT NOTE   Patient Name: James [name] MRN: [id number] DOB:11/02/1975, 48 y.o., male Today's Date: 07/11/2023  END OF SESSION:     Past Medical History:  Diagnosis Date   Alcohol dependence with uncomplicated withdrawal (HCC) 06/07/2017   Alcohol use disorder, moderate, dependence (HCC) 04/06/2018   Asthma    Black stools    Bronchitis    11/2011   COPD (chronic obstructive pulmonary disease) (HCC)    Difficulty sleeping    Dizziness    Emphysema of lung (HCC)    ETOH abuse    Sober since 03/2023   Former smoker    GERD (gastroesophageal reflux disease)    Hypertension goal BP (blood pressure) < 140/90 12/21/2011   Hypokalemia 12/28/2019   Neurofibromatosis, type 1 (HCC)    diagnosed 2013   Neuromuscular disorder (HCC) 2014   neurofibrosis   Shortness of breath    with exertion   Vomiting blood    Past Surgical History:  Procedure Laterality Date   COLONOSCOPY  2023   Dr. Murlean Iba   ESOPHAGOGASTRODUODENOSCOPY (EGD) WITH PROPOFOL  05/14/2012   Procedure: ESOPHAGOGASTRODUODENOSCOPY (EGD) WITH PROPOFOL;  Surgeon: Willis Modena, MD;  Location: [facility];  Service: Endoscopy;  Laterality: N/A;   LUMBAR LAMINECTOMY/ DECOMPRESSION WITH MET-RX Left 07/02/2023   Procedure: Left Lumbar five-Sacral one Minimally Invasive Surgery microdiscectomy;  Surgeon: Bedelia Person, MD;  Location: [facility] OR;  Service: Neurosurgery;  Laterality: Left;  C3   UPPER GASTROINTESTINAL ENDOSCOPY     Patient Active Problem List   Diagnosis Date Noted   Alcohol abuse 12/05/2021   Blurred vision, bilateral 07/24/2021   Chronic tension-type headache, not intractable 09/28/2020   Depression, recurrent (HCC) 10/05/2019   Gastroesophageal reflux disease 04/06/2018   COPD, mild (HCC) 10/24/2016   Chronic cough 09/17/2014   Tobacco use disorder 05/11/2014   Thoracic spine tumor 04/13/2014   Vitamin D insufficiency 06/25/2013   Backache 03/17/2012    Hypertension 12/21/2011   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  ONSET DATE: 07/02/23  SUBJECTIVE:                                                                                                                                                                                           SUBJECTIVE STATEMENT: *** Better since last session.  Moving easier, overall pain and guarding has eased off  PERTINENT  HISTORY:  L5-S1 microdiskectomy 07/01/23  PAIN:  Are you having pain? Yes: NPRS scale: 9/10 Pain location: low back Pain description: ache, sore Aggravating factors: activity   Relieving factors: meds and rest   PRECAUTIONS: Back  RED FLAGS: None   WEIGHT BEARING RESTRICTIONS: No  FALLS:  Has patient fallen in last 6 months? No  OCCUPATION: not working  PLOF: Independent  PATIENT GOALS: To manage my low back pain  NEXT MD VISIT: 07/22/23  OBJECTIVE:  Note: Objective measures were completed at Evaluation unless otherwise noted.  DIAGNOSTIC FINDINGS:  none  PATIENT SURVEYS:  Modified Oswestry 19/50 38% perceived disability   MUSCLE LENGTH: Hamstrings: B restrictions due to guarding   POSTURE: rounded shoulders, decreased lumbar lordosis, and flexed trunk   PALPATION: Deferred post op, surgical site inspected, honeycomb dressing in place, no redness, warmth or drainage detected.  LUMBAR ROM: deferred due to post-op status and pain  AROM eval  Flexion   Extension   Right lateral flexion   Left lateral flexion   Right rotation   Left rotation    (Blank rows = not tested)  LOWER EXTREMITY ROM:   WFL for transfers, bed mobility and gait  Active  Right eval Left eval  Hip flexion    Hip  extension    Hip abduction    Hip adduction    Hip internal rotation    Hip external rotation    Knee flexion    Knee extension    Ankle dorsiflexion    Ankle plantarflexion    Ankle inversion    Ankle eversion     (Blank rows = not tested)  LOWER EXTREMITY MMT:    MMT Right eval Left eval  Hip flexion    Hip extension    Hip abduction    Hip adduction    Hip internal rotation    Hip external rotation    Knee flexion    Knee extension    Ankle dorsiflexion    Ankle plantarflexion    Ankle inversion    Ankle eversion     (Blank rows = not tested)  LUMBAR SPECIAL TESTS:  Slump test: Negative  FUNCTIONAL TESTS:  30 seconds chair stand test 1 rep with UE support  GAIT: Distance walked: 50ftx2 Assistive device utilized: None Level of assistance: Complete Independence Comments: slow cadence with antalgic gait  TREATMENT DATE:  [facility] Adult PT Treatment:                                                DATE: 07/11/23 Therapeutic Exercise: Nustep L2 8 min Seated hamstring stretch 30s x2 B Prone on elbows 2 min Therapeutic Activity: PPT 3s 10x PPT with alt march 10/10 90/90 30s x2 DKTC over p-ball 15x STS 5x focus on neutral spine Seated lat press with breathing 10x Seated lat press with alt FAQs 10/10  [facility] Adult PT Treatment:                                                DATE: 07/09/23 Therapeutic Exercise: Seated hamstring stretch 30s x2 B Nustep L2 8 min Prone on elbows 2 min  Therapeutic Activity: PPT 3s 10x PPT with alt march 10/10 90/90 30s x2  DKTC over p-ball 15x STS 5x focus on neutral spine Seated lat press with breathing 10x Seated lat press with alt FAQs 10/10  [facility] Adult PT Treatment:                                                DATE: 07/04/23 Evaluation Self Care: Discussion and demonstration of transfer technique, log roll and proper sitting posture.                                                                                                                                 PATIENT EDUCATION:  Education details: Discussed eval findings, rehab rationale and POC and patient is in agreement  Person educated: Patient Education method: Explanation Education comprehension: verbalized understanding and needs further education  HOME EXERCISE PROGRAM: Access Code: [email or web address] URL: [email or web address] Date: 07/09/2023 Prepared by: Gustavus Bryant  Exercises - Seated Table Hamstring Stretch  - 2 x daily - 5 x weekly - 1 sets - 2 reps - 30s hold - Static Prone on Elbows  - 2 x daily - 5 x weekly - 1 sets - 1 reps - 2 min hold - Sit to Stand with Arms Crossed  - 2 x daily - 5 x weekly - 1 sets - 5 reps - Supine Posterior Pelvic Tilt  - 2 x daily - 5 x weekly - 1 sets - 10 reps - 3s hold  ASSESSMENT:  CLINICAL IMPRESSION:  ***  patient returns for first f/u session.  Has obtained prescription meds and feeling much better.  Focus of today was stretching, core strengthening, aerobic work and Civil engineer, contracting.  HEP issued.  (Eval)Patient is a 48 y.o. male who was seen today for physical therapy evaluation and treatment for low back pain and postural dysfunction following microdiskectomy 07/02/23. Guarding present throughout.  Scope of assessment limited by pain and respect for post-op status.  Review of proper transfer technique, log roll and proper sitting posture to minimize stress on spine.  OBJECTIVE IMPAIRMENTS: Abnormal gait, decreased activity tolerance, decreased endurance, decreased knowledge of condition, decreased mobility, difficulty walking, decreased ROM, decreased strength, impaired perceived functional ability, improper body mechanics, postural dysfunction, and pain.   ACTIVITY LIMITATIONS: carrying, lifting, bending, sitting, standing, squatting, sleeping, transfers, and bed mobility  PERSONAL FACTORS: Age, Fitness, and 1 comorbidity: COPD/Asthma  are also affecting patient's functional outcome.    REHAB POTENTIAL: Good  CLINICAL DECISION MAKING: Evolving/moderate complexity  EVALUATION COMPLEXITY: Low   GOALS: Goals reviewed with patient? No  SHORT TERM GOALS: Target date: 07/25/2023   Patient to demonstrate independence in HEP  Baseline: TBD; 07/09/23 [email or web address] Goal status: INITIAL  2.  Patient to perform one STS transfer w/o UE support  Baseline: 07/09/23 5x Goal status: Met    LONG TERM GOALS: Target date: 08/15/2023  Patient will acknowledge 6/10 worst pain at least once during episode of care   Baseline: 9/10 Goal status: INITIAL  2.  Patient will increase 30s chair stand reps from 1 to 8 with/without arms to demonstrate and improved functional ability with less pain/difficulty as well as reduce fall risk.  Baseline: 1 Goal status: INITIAL  3.  Patient will score at least 11/50 on ODI to signify clinically meaningful improvement in functional abilities.   Baseline: 19/50 Goal status: INITIAL  4.  Patient to demonstrate 75% trunk AROM when at appropriate post-op interval.  Baseline:  Goal status: INITIAL    PLAN:  PT FREQUENCY: 1-2x/week  PT DURATION: 6 weeks  PLANNED INTERVENTIONS: 97164- PT Re-evaluation, 97110-Therapeutic exercises, 97530- Therapeutic activity, 97112- Neuromuscular re-education, 97535- Self Care, [id number]- Manual therapy, [id number]- Gait training, and Spinal mobilization.  PLAN FOR NEXT SESSION: HEP review and update, manual techniques as appropriate, aerobic tasks, ROM and flexibility activities, strengthening and PREs, TPDN, gait and balance training as needed       Berta Minor, PTA 07/11/2023, 9:31 AM

## 2023-07-16 ENCOUNTER — Ambulatory Visit: Payer: Medicaid Other

## 2023-07-16 ENCOUNTER — Telehealth: Payer: Self-pay

## 2023-07-16 NOTE — Therapy (Incomplete)
OUTPATIENT PHYSICAL THERAPY TREATMENT NOTE   Patient Name: James [name] MRN: [id number] DOB:[birth year], 48 y.o., male Today's Date: 07/16/2023  END OF SESSION:     Past Medical History:  Diagnosis Date   Alcohol dependence with uncomplicated withdrawal (HCC) 06/07/2017   Alcohol use disorder, moderate, dependence (HCC) 04/06/2018   Asthma    Black stools    Bronchitis    11/2011   COPD (chronic obstructive pulmonary disease) (HCC)    Difficulty sleeping    Dizziness    Emphysema of lung (HCC)    ETOH abuse    Sober since 03/2023   Former smoker    GERD (gastroesophageal reflux disease)    Hypertension goal BP (blood pressure) < 140/90 12/21/2011   Hypokalemia 12/28/2019   Neurofibromatosis, type 1 (HCC)    diagnosed 2013   Neuromuscular disorder (HCC) 2014   neurofibrosis   Shortness of breath    with exertion   Vomiting blood    Past Surgical History:  Procedure Laterality Date   COLONOSCOPY  2023   Dr. Murlean Iba   ESOPHAGOGASTRODUODENOSCOPY (EGD) WITH PROPOFOL  05/14/2012   Procedure: ESOPHAGOGASTRODUODENOSCOPY (EGD) WITH PROPOFOL;  Surgeon: Willis Modena, MD;  Location: [facility];  Service: Endoscopy;  Laterality: N/A;   LUMBAR LAMINECTOMY/ DECOMPRESSION WITH MET-RX Left 07/02/2023   Procedure: Left Lumbar five-Sacral one Minimally Invasive Surgery microdiscectomy;  Surgeon: Bedelia Person, MD;  Location: [facility] OR;  Service: Neurosurgery;  Laterality: Left;  C3   UPPER GASTROINTESTINAL ENDOSCOPY     Patient Active Problem List   Diagnosis Date Noted   Alcohol abuse 12/05/2021   Blurred vision, bilateral 07/24/2021   Chronic tension-type headache, not intractable 09/28/2020   Depression, recurrent (HCC) 10/05/2019   Gastroesophageal reflux disease 04/06/2018   COPD, mild (HCC) 10/24/2016   Chronic cough 09/17/2014   Tobacco use disorder 05/11/2014   Thoracic spine tumor 04/13/2014   Vitamin D insufficiency 06/25/2013   Backache 03/17/2012    Hypertension 12/21/2011   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  ONSET DATE: 07/02/23  SUBJECTIVE:                                                                                                                                                                                           SUBJECTIVE STATEMENT: *** Better since last session.  Moving easier, overall pain and guarding has eased off  PERTINENT  HISTORY:  L5-S1 microdiskectomy 07/01/23  PAIN:  Are you having pain? Yes: NPRS scale: 9/10 Pain location: low back Pain description: ache, sore Aggravating factors: activity   Relieving factors: meds and rest   PRECAUTIONS: Back  RED FLAGS: None   WEIGHT BEARING RESTRICTIONS: No  FALLS:  Has patient fallen in last 6 months? No  OCCUPATION: not working  PLOF: Independent  PATIENT GOALS: To manage my low back pain  NEXT MD VISIT: 07/22/23  OBJECTIVE:  Note: Objective measures were completed at Evaluation unless otherwise noted.  DIAGNOSTIC FINDINGS:  none  PATIENT SURVEYS:  Modified Oswestry 19/50 38% perceived disability   MUSCLE LENGTH: Hamstrings: B restrictions due to guarding   POSTURE: rounded shoulders, decreased lumbar lordosis, and flexed trunk   PALPATION: Deferred post op, surgical site inspected, honeycomb dressing in place, no redness, warmth or drainage detected.  LUMBAR ROM: deferred due to post-op status and pain  AROM eval  Flexion   Extension   Right lateral flexion   Left lateral flexion   Right rotation   Left rotation    (Blank rows = not tested)  LOWER EXTREMITY ROM:   WFL for transfers, bed mobility and gait  Active  Right eval Left eval  Hip flexion    Hip  extension    Hip abduction    Hip adduction    Hip internal rotation    Hip external rotation    Knee flexion    Knee extension    Ankle dorsiflexion    Ankle plantarflexion    Ankle inversion    Ankle eversion     (Blank rows = not tested)  LOWER EXTREMITY MMT:    MMT Right eval Left eval  Hip flexion    Hip extension    Hip abduction    Hip adduction    Hip internal rotation    Hip external rotation    Knee flexion    Knee extension    Ankle dorsiflexion    Ankle plantarflexion    Ankle inversion    Ankle eversion     (Blank rows = not tested)  LUMBAR SPECIAL TESTS:  Slump test: Negative  FUNCTIONAL TESTS:  30 seconds chair stand test 1 rep with UE support  GAIT: Distance walked: 14ftx2 Assistive device utilized: None Level of assistance: Complete Independence Comments: slow cadence with antalgic gait  TREATMENT DATE:  [facility] Adult PT Treatment:                                                DATE: 07/16/23 Therapeutic Exercise: Nustep L2 8 min Seated hamstring stretch 30s x2 B Prone on elbows 2 min Therapeutic Activity: PPT 3s 10x PPT with alt march 10/10 90/90 30s x2 DKTC over p-ball 15x STS 5x focus on neutral spine Seated lat press with breathing 10x Seated lat press with alt FAQs 10/10  [facility] Adult PT Treatment:                                                DATE: 07/09/23 Therapeutic Exercise: Seated hamstring stretch 30s x2 B Nustep L2 8 min Prone on elbows 2 min  Therapeutic Activity: PPT 3s 10x PPT with alt march 10/10 90/90 30s x2  DKTC over p-ball 15x STS 5x focus on neutral spine Seated lat press with breathing 10x Seated lat press with alt FAQs 10/10  [facility] Adult PT Treatment:                                                DATE: 07/04/23 Evaluation Self Care: Discussion and demonstration of transfer technique, log roll and proper sitting posture.                                                                                                                                 PATIENT EDUCATION:  Education details: Discussed eval findings, rehab rationale and POC and patient is in agreement  Person educated: Patient Education method: Explanation Education comprehension: verbalized understanding and needs further education  HOME EXERCISE PROGRAM: Access Code: [email or web address] URL: [email or web address] Date: 07/09/2023 Prepared by: Gustavus Bryant  Exercises - Seated Table Hamstring Stretch  - 2 x daily - 5 x weekly - 1 sets - 2 reps - 30s hold - Static Prone on Elbows  - 2 x daily - 5 x weekly - 1 sets - 1 reps - 2 min hold - Sit to Stand with Arms Crossed  - 2 x daily - 5 x weekly - 1 sets - 5 reps - Supine Posterior Pelvic Tilt  - 2 x daily - 5 x weekly - 1 sets - 10 reps - 3s hold  ASSESSMENT:  CLINICAL IMPRESSION:  ***  patient returns for first f/u session.  Has obtained prescription meds and feeling much better.  Focus of today was stretching, core strengthening, aerobic work and Civil engineer, contracting.  HEP issued.  (Eval)Patient is a 48 y.o. male who was seen today for physical therapy evaluation and treatment for low back pain and postural dysfunction following microdiskectomy 07/02/23. Guarding present throughout.  Scope of assessment limited by pain and respect for post-op status.  Review of proper transfer technique, log roll and proper sitting posture to minimize stress on spine.  OBJECTIVE IMPAIRMENTS: Abnormal gait, decreased activity tolerance, decreased endurance, decreased knowledge of condition, decreased mobility, difficulty walking, decreased ROM, decreased strength, impaired perceived functional ability, improper body mechanics, postural dysfunction, and pain.   ACTIVITY LIMITATIONS: carrying, lifting, bending, sitting, standing, squatting, sleeping, transfers, and bed mobility  PERSONAL FACTORS: Age, Fitness, and 1 comorbidity: COPD/Asthma  are also affecting patient's functional outcome.    REHAB POTENTIAL: Good  CLINICAL DECISION MAKING: Evolving/moderate complexity  EVALUATION COMPLEXITY: Low   GOALS: Goals reviewed with patient? No  SHORT TERM GOALS: Target date: 07/25/2023   Patient to demonstrate independence in HEP  Baseline: TBD; 07/09/23 [email or web address] Goal status: INITIAL  2.  Patient to perform one STS transfer w/o UE support  Baseline: 07/09/23 5x Goal status: Met    LONG TERM GOALS: Target date: 08/15/2023  Patient will acknowledge 6/10 worst pain at least once during episode of care   Baseline: 9/10 Goal status: INITIAL  2.  Patient will increase 30s chair stand reps from 1 to 8 with/without arms to demonstrate and improved functional ability with less pain/difficulty as well as reduce fall risk.  Baseline: 1 Goal status: INITIAL  3.  Patient will score at least 11/50 on ODI to signify clinically meaningful improvement in functional abilities.   Baseline: 19/50 Goal status: INITIAL  4.  Patient to demonstrate 75% trunk AROM when at appropriate post-op interval.  Baseline:  Goal status: INITIAL    PLAN:  PT FREQUENCY: 1-2x/week  PT DURATION: 6 weeks  PLANNED INTERVENTIONS: 97164- PT Re-evaluation, 97110-Therapeutic exercises, 97530- Therapeutic activity, 97112- Neuromuscular re-education, 97535- Self Care, [id number]- Manual therapy, [id number]- Gait training, and Spinal mobilization.  PLAN FOR NEXT SESSION: HEP review and update, manual techniques as appropriate, aerobic tasks, ROM and flexibility activities, strengthening and PREs, TPDN, gait and balance training as needed       Berta Minor, PTA 07/16/2023, 12:03 PM

## 2023-07-16 NOTE — Telephone Encounter (Signed)
LVM regarding missed visit. Confirmed next appointment time and reminded of clinic attendance policy.   1st no-show  Berta Minor, [place] 07/16/23 2:28 PM

## 2023-07-18 ENCOUNTER — Ambulatory Visit: Payer: Medicaid Other

## 2023-07-22 NOTE — Therapy (Deleted)
 OUTPATIENT PHYSICAL THERAPY TREATMENT NOTE   Patient Name: James [name] MRN: [id number] DOB:[birth year], 48 y.o., male Today's Date: 07/22/2023  END OF SESSION:     Past Medical History:  Diagnosis Date   Alcohol dependence with uncomplicated withdrawal (HCC) 06/07/2017   Alcohol use disorder, moderate, dependence (HCC) 04/06/2018   Asthma    Black stools    Bronchitis    11/2011   COPD (chronic obstructive pulmonary disease) (HCC)    Difficulty sleeping    Dizziness    Emphysema of lung (HCC)    ETOH abuse    Sober since 03/2023   Former smoker    GERD (gastroesophageal reflux disease)    Hypertension goal BP (blood pressure) < 140/90 12/21/2011   Hypokalemia 12/28/2019   Neurofibromatosis, type 1 (HCC)    diagnosed 2013   Neuromuscular disorder (HCC) 2014   neurofibrosis   Shortness of breath    with exertion   Vomiting blood    Past Surgical History:  Procedure Laterality Date   COLONOSCOPY  2023   Dr. Murlean Iba   ESOPHAGOGASTRODUODENOSCOPY (EGD) WITH PROPOFOL  05/14/2012   Procedure: ESOPHAGOGASTRODUODENOSCOPY (EGD) WITH PROPOFOL;  Surgeon: Willis Modena, MD;  Location: [facility];  Service: Endoscopy;  Laterality: N/A;   LUMBAR LAMINECTOMY/ DECOMPRESSION WITH MET-RX Left 07/02/2023   Procedure: Left Lumbar five-Sacral one Minimally Invasive Surgery microdiscectomy;  Surgeon: Bedelia Person, MD;  Location: [facility] OR;  Service: Neurosurgery;  Laterality: Left;  C3   UPPER GASTROINTESTINAL ENDOSCOPY     Patient Active Problem List   Diagnosis Date Noted   Alcohol abuse 12/05/2021   Blurred vision, bilateral 07/24/2021   Chronic tension-type headache, not intractable 09/28/2020   Depression, recurrent (HCC) 10/05/2019   Gastroesophageal reflux disease 04/06/2018   COPD, mild (HCC) 10/24/2016   Chronic cough 09/17/2014   Tobacco use disorder 05/11/2014   Thoracic spine tumor 04/13/2014   Vitamin D insufficiency 06/25/2013   Backache 03/17/2012    Hypertension 12/21/2011   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  ONSET DATE: 07/02/23  SUBJECTIVE:                                                                                                                                                                                           SUBJECTIVE STATEMENT: *** Better since last session.  Moving easier, overall pain and guarding has eased off  PERTINENT  HISTORY:  L5-S1 microdiskectomy 07/01/23  PAIN:  Are you having pain? Yes: NPRS scale: 9/10 Pain location: low back Pain description: ache, sore Aggravating factors: activity   Relieving factors: meds and rest   PRECAUTIONS: Back  RED FLAGS: None   WEIGHT BEARING RESTRICTIONS: No  FALLS:  Has patient fallen in last 6 months? No  OCCUPATION: not working  PLOF: Independent  PATIENT GOALS: To manage my low back pain  NEXT MD VISIT: 07/22/23  OBJECTIVE:  Note: Objective measures were completed at Evaluation unless otherwise noted.  DIAGNOSTIC FINDINGS:  none  PATIENT SURVEYS:  Modified Oswestry 19/50 38% perceived disability   MUSCLE LENGTH: Hamstrings: B restrictions due to guarding   POSTURE: rounded shoulders, decreased lumbar lordosis, and flexed trunk   PALPATION: Deferred post op, surgical site inspected, honeycomb dressing in place, no redness, warmth or drainage detected.  LUMBAR ROM: deferred due to post-op status and pain  AROM eval  Flexion   Extension   Right lateral flexion   Left lateral flexion   Right rotation   Left rotation    (Blank rows = not tested)  LOWER EXTREMITY ROM:   WFL for transfers, bed mobility and gait  Active  Right eval Left eval  Hip flexion    Hip  extension    Hip abduction    Hip adduction    Hip internal rotation    Hip external rotation    Knee flexion    Knee extension    Ankle dorsiflexion    Ankle plantarflexion    Ankle inversion    Ankle eversion     (Blank rows = not tested)  LOWER EXTREMITY MMT:    MMT Right eval Left eval  Hip flexion    Hip extension    Hip abduction    Hip adduction    Hip internal rotation    Hip external rotation    Knee flexion    Knee extension    Ankle dorsiflexion    Ankle plantarflexion    Ankle inversion    Ankle eversion     (Blank rows = not tested)  LUMBAR SPECIAL TESTS:  Slump test: Negative  FUNCTIONAL TESTS:  30 seconds chair stand test 1 rep with UE support  GAIT: Distance walked: 46ftx2 Assistive device utilized: None Level of assistance: Complete Independence Comments: slow cadence with antalgic gait  TREATMENT DATE:  [facility] Adult PT Treatment:                                                DATE: 07/16/23 Therapeutic Exercise: Nustep L2 8 min Seated hamstring stretch 30s x2 B Prone on elbows 2 min Therapeutic Activity: PPT 3s 10x PPT with alt march 10/10 90/90 30s x2 DKTC over p-ball 15x STS 5x focus on neutral spine Seated lat press with breathing 10x Seated lat press with alt FAQs 10/10  [facility] Adult PT Treatment:                                                DATE: 07/09/23 Therapeutic Exercise: Seated hamstring stretch 30s x2 B Nustep L2 8 min Prone on elbows 2 min  Therapeutic Activity: PPT 3s 10x PPT with alt march 10/10 90/90 30s x2  DKTC over p-ball 15x STS 5x focus on neutral spine Seated lat press with breathing 10x Seated lat press with alt FAQs 10/10  [facility] Adult PT Treatment:                                                DATE: 07/04/23 Evaluation Self Care: Discussion and demonstration of transfer technique, log roll and proper sitting posture.                                                                                                                                 PATIENT EDUCATION:  Education details: Discussed eval findings, rehab rationale and POC and patient is in agreement  Person educated: Patient Education method: Explanation Education comprehension: verbalized understanding and needs further education  HOME EXERCISE PROGRAM: Access Code: [email or web address] URL: [email or web address] Date: 07/09/2023 Prepared by: Gustavus Bryant  Exercises - Seated Table Hamstring Stretch  - 2 x daily - 5 x weekly - 1 sets - 2 reps - 30s hold - Static Prone on Elbows  - 2 x daily - 5 x weekly - 1 sets - 1 reps - 2 min hold - Sit to Stand with Arms Crossed  - 2 x daily - 5 x weekly - 1 sets - 5 reps - Supine Posterior Pelvic Tilt  - 2 x daily - 5 x weekly - 1 sets - 10 reps - 3s hold  ASSESSMENT:  CLINICAL IMPRESSION:  ***  patient returns for first f/u session.  Has obtained prescription meds and feeling much better.  Focus of today was stretching, core strengthening, aerobic work and Civil engineer, contracting.  HEP issued.  (Eval)Patient is a 48 y.o. male who was seen today for physical therapy evaluation and treatment for low back pain and postural dysfunction following microdiskectomy 07/02/23. Guarding present throughout.  Scope of assessment limited by pain and respect for post-op status.  Review of proper transfer technique, log roll and proper sitting posture to minimize stress on spine.  OBJECTIVE IMPAIRMENTS: Abnormal gait, decreased activity tolerance, decreased endurance, decreased knowledge of condition, decreased mobility, difficulty walking, decreased ROM, decreased strength, impaired perceived functional ability, improper body mechanics, postural dysfunction, and pain.   ACTIVITY LIMITATIONS: carrying, lifting, bending, sitting, standing, squatting, sleeping, transfers, and bed mobility  PERSONAL FACTORS: Age, Fitness, and 1 comorbidity: COPD/Asthma  are also affecting patient's functional outcome.    REHAB POTENTIAL: Good  CLINICAL DECISION MAKING: Evolving/moderate complexity  EVALUATION COMPLEXITY: Low   GOALS: Goals reviewed with patient? No  SHORT TERM GOALS: Target date: 07/25/2023   Patient to demonstrate independence in HEP  Baseline: TBD; 07/09/23 [email or web address] Goal status: INITIAL  2.  Patient to perform one STS transfer w/o UE support  Baseline: 07/09/23 5x Goal status: Met    LONG TERM GOALS: Target date: 08/15/2023  Patient will acknowledge 6/10 worst pain at least once during episode of care   Baseline: 9/10 Goal status: INITIAL  2.  Patient will increase 30s chair stand reps from 1 to 8 with/without arms to demonstrate and improved functional ability with less pain/difficulty as well as reduce fall risk.  Baseline: 1 Goal status: INITIAL  3.  Patient will score at least 11/50 on ODI to signify clinically meaningful improvement in functional abilities.   Baseline: 19/50 Goal status: INITIAL  4.  Patient to demonstrate 75% trunk AROM when at appropriate post-op interval.  Baseline:  Goal status: INITIAL    PLAN:  PT FREQUENCY: 1-2x/week  PT DURATION: 6 weeks  PLANNED INTERVENTIONS: 97164- PT Re-evaluation, 97110-Therapeutic exercises, 97530- Therapeutic activity, 97112- Neuromuscular re-education, 97535- Self Care, 16109- Manual therapy, [id number]- Gait training, and Spinal mobilization.  PLAN FOR NEXT SESSION: HEP review and update, manual techniques as appropriate, aerobic tasks, ROM and flexibility activities, strengthening and PREs, TPDN, gait and balance training as needed       Hildred Laser, PT 07/22/2023, 3:55 PM

## 2023-07-23 ENCOUNTER — Telehealth: Payer: Self-pay

## 2023-07-23 ENCOUNTER — Ambulatory Visit: Payer: Medicaid Other

## 2023-07-23 NOTE — Telephone Encounter (Signed)
 TC due to missed visit.  Spoke directly to patient.  Reminded him of next appointment time as well as attendance policy.

## 2023-07-24 NOTE — Therapy (Deleted)
 OUTPATIENT PHYSICAL THERAPY TREATMENT NOTE   Patient Name: James [name] MRN: [id number] DOB:1976-02-07, 48 y.o., male Today's Date: 07/24/2023  END OF SESSION:     Past Medical History:  Diagnosis Date   Alcohol dependence with uncomplicated withdrawal (HCC) 06/07/2017   Alcohol use disorder, moderate, dependence (HCC) 04/06/2018   Asthma    Black stools    Bronchitis    11/2011   COPD (chronic obstructive pulmonary disease) (HCC)    Difficulty sleeping    Dizziness    Emphysema of lung (HCC)    ETOH abuse    Sober since 03/2023   Former smoker    GERD (gastroesophageal reflux disease)    Hypertension goal BP (blood pressure) < 140/90 12/21/2011   Hypokalemia 12/28/2019   Neurofibromatosis, type 1 (HCC)    diagnosed 2013   Neuromuscular disorder (HCC) 2014   neurofibrosis   Shortness of breath    with exertion   Vomiting blood    Past Surgical History:  Procedure Laterality Date   COLONOSCOPY  2023   Dr. Murlean Iba   ESOPHAGOGASTRODUODENOSCOPY (EGD) WITH PROPOFOL  05/14/2012   Procedure: ESOPHAGOGASTRODUODENOSCOPY (EGD) WITH PROPOFOL;  Surgeon: Willis Modena, MD;  Location: [facility];  Service: Endoscopy;  Laterality: N/A;   LUMBAR LAMINECTOMY/ DECOMPRESSION WITH MET-RX Left 07/02/2023   Procedure: Left Lumbar five-Sacral one Minimally Invasive Surgery microdiscectomy;  Surgeon: Bedelia Person, MD;  Location: [facility] OR;  Service: Neurosurgery;  Laterality: Left;  C3   UPPER GASTROINTESTINAL ENDOSCOPY     Patient Active Problem List   Diagnosis Date Noted   Alcohol abuse 12/05/2021   Blurred vision, bilateral 07/24/2021   Chronic tension-type headache, not intractable 09/28/2020   Depression, recurrent (HCC) 10/05/2019   Gastroesophageal reflux disease 04/06/2018   COPD, mild (HCC) 10/24/2016   Chronic cough 09/17/2014   Tobacco use disorder 05/11/2014   Thoracic spine tumor 04/13/2014   Vitamin D insufficiency 06/25/2013   Backache 03/17/2012    Hypertension 12/21/2011   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  ONSET DATE: 07/02/23  SUBJECTIVE:                                                                                                                                                                                           SUBJECTIVE STATEMENT: *** Better since last session.  Moving easier, overall pain and guarding has eased off  PERTINENT  HISTORY:  L5-S1 microdiskectomy 07/01/23  PAIN:  Are you having pain? Yes: NPRS scale: 9/10 Pain location: low back Pain description: ache, sore Aggravating factors: activity   Relieving factors: meds and rest   PRECAUTIONS: Back  RED FLAGS: None   WEIGHT BEARING RESTRICTIONS: No  FALLS:  Has patient fallen in last 6 months? No  OCCUPATION: not working  PLOF: Independent  PATIENT GOALS: To manage my low back pain  NEXT MD VISIT: 07/22/23  OBJECTIVE:  Note: Objective measures were completed at Evaluation unless otherwise noted.  DIAGNOSTIC FINDINGS:  none  PATIENT SURVEYS:  Modified Oswestry 19/50 38% perceived disability   MUSCLE LENGTH: Hamstrings: B restrictions due to guarding   POSTURE: rounded shoulders, decreased lumbar lordosis, and flexed trunk   PALPATION: Deferred post op, surgical site inspected, honeycomb dressing in place, no redness, warmth or drainage detected.  LUMBAR ROM: deferred due to post-op status and pain  AROM eval  Flexion   Extension   Right lateral flexion   Left lateral flexion   Right rotation   Left rotation    (Blank rows = not tested)  LOWER EXTREMITY ROM:   WFL for transfers, bed mobility and gait  Active  Right eval Left eval  Hip flexion    Hip  extension    Hip abduction    Hip adduction    Hip internal rotation    Hip external rotation    Knee flexion    Knee extension    Ankle dorsiflexion    Ankle plantarflexion    Ankle inversion    Ankle eversion     (Blank rows = not tested)  LOWER EXTREMITY MMT:    MMT Right eval Left eval  Hip flexion    Hip extension    Hip abduction    Hip adduction    Hip internal rotation    Hip external rotation    Knee flexion    Knee extension    Ankle dorsiflexion    Ankle plantarflexion    Ankle inversion    Ankle eversion     (Blank rows = not tested)  LUMBAR SPECIAL TESTS:  Slump test: Negative  FUNCTIONAL TESTS:  30 seconds chair stand test 1 rep with UE support  GAIT: Distance walked: 23ftx2 Assistive device utilized: None Level of assistance: Complete Independence Comments: slow cadence with antalgic gait  TREATMENT DATE:  [facility] Adult PT Treatment:                                                DATE: 07/16/23 Therapeutic Exercise: Nustep L2 8 min Seated hamstring stretch 30s x2 B Prone on elbows 2 min Therapeutic Activity: PPT 3s 10x PPT with alt march 10/10 90/90 30s x2 DKTC over p-ball 15x STS 5x focus on neutral spine Seated lat press with breathing 10x Seated lat press with alt FAQs 10/10  [facility] Adult PT Treatment:                                                DATE: 07/09/23 Therapeutic Exercise: Seated hamstring stretch 30s x2 B Nustep L2 8 min Prone on elbows 2 min  Therapeutic Activity: PPT 3s 10x PPT with alt march 10/10 90/90 30s x2  DKTC over p-ball 15x STS 5x focus on neutral spine Seated lat press with breathing 10x Seated lat press with alt FAQs 10/10  [facility] Adult PT Treatment:                                                DATE: 07/04/23 Evaluation Self Care: Discussion and demonstration of transfer technique, log roll and proper sitting posture.                                                                                                                                 PATIENT EDUCATION:  Education details: Discussed eval findings, rehab rationale and POC and patient is in agreement  Person educated: Patient Education method: Explanation Education comprehension: verbalized understanding and needs further education  HOME EXERCISE PROGRAM: Access Code: [email or web address] URL: [email or web address] Date: 07/09/2023 Prepared by: Gustavus Bryant  Exercises - Seated Table Hamstring Stretch  - 2 x daily - 5 x weekly - 1 sets - 2 reps - 30s hold - Static Prone on Elbows  - 2 x daily - 5 x weekly - 1 sets - 1 reps - 2 min hold - Sit to Stand with Arms Crossed  - 2 x daily - 5 x weekly - 1 sets - 5 reps - Supine Posterior Pelvic Tilt  - 2 x daily - 5 x weekly - 1 sets - 10 reps - 3s hold  ASSESSMENT:  CLINICAL IMPRESSION:  ***  patient returns for first f/u session.  Has obtained prescription meds and feeling much better.  Focus of today was stretching, core strengthening, aerobic work and Civil engineer, contracting.  HEP issued.  (Eval)Patient is a 48 y.o. male who was seen today for physical therapy evaluation and treatment for low back pain and postural dysfunction following microdiskectomy 07/02/23. Guarding present throughout.  Scope of assessment limited by pain and respect for post-op status.  Review of proper transfer technique, log roll and proper sitting posture to minimize stress on spine.  OBJECTIVE IMPAIRMENTS: Abnormal gait, decreased activity tolerance, decreased endurance, decreased knowledge of condition, decreased mobility, difficulty walking, decreased ROM, decreased strength, impaired perceived functional ability, improper body mechanics, postural dysfunction, and pain.   ACTIVITY LIMITATIONS: carrying, lifting, bending, sitting, standing, squatting, sleeping, transfers, and bed mobility  PERSONAL FACTORS: Age, Fitness, and 1 comorbidity: COPD/Asthma  are also affecting patient's functional outcome.    REHAB POTENTIAL: Good  CLINICAL DECISION MAKING: Evolving/moderate complexity  EVALUATION COMPLEXITY: Low   GOALS: Goals reviewed with patient? No  SHORT TERM GOALS: Target date: 07/25/2023   Patient to demonstrate independence in HEP  Baseline: TBD; 07/09/23 [email or web address] Goal status: INITIAL  2.  Patient to perform one STS transfer w/o UE support  Baseline: 07/09/23 5x Goal status: Met    LONG TERM GOALS: Target date: 08/15/2023  Patient will acknowledge 6/10 worst pain at least once during episode of care   Baseline: 9/10 Goal status: INITIAL  2.  Patient will increase 30s chair stand reps from 1 to 8 with/without arms to demonstrate and improved functional ability with less pain/difficulty as well as reduce fall risk.  Baseline: 1 Goal status: INITIAL  3.  Patient will score at least 11/50 on ODI to signify clinically meaningful improvement in functional abilities.   Baseline: 19/50 Goal status: INITIAL  4.  Patient to demonstrate 75% trunk AROM when at appropriate post-op interval.  Baseline:  Goal status: INITIAL    PLAN:  PT FREQUENCY: 1-2x/week  PT DURATION: 6 weeks  PLANNED INTERVENTIONS: 97164- PT Re-evaluation, 97110-Therapeutic exercises, 97530- Therapeutic activity, 97112- Neuromuscular re-education, 97535- Self Care, 16109- Manual therapy, [id number]- Gait training, and Spinal mobilization.  PLAN FOR NEXT SESSION: HEP review and update, manual techniques as appropriate, aerobic tasks, ROM and flexibility activities, strengthening and PREs, TPDN, gait and balance training as needed       Hildred Laser, PT 07/24/2023, 10:30 AM

## 2023-07-25 ENCOUNTER — Ambulatory Visit: Payer: Medicaid Other

## 2023-08-12 ENCOUNTER — Ambulatory Visit: Payer: Medicaid Other | Admitting: Pulmonary Disease

## 2023-08-12 ENCOUNTER — Encounter: Payer: Self-pay | Admitting: Pulmonary Disease

## 2023-08-12 VITALS — BP 141/89 | HR 88 | Ht 66.0 in | Wt 183.0 lb

## 2023-08-12 DIAGNOSIS — R9389 Abnormal findings on diagnostic imaging of other specified body structures: Secondary | ICD-10-CM

## 2023-08-12 DIAGNOSIS — D219 Benign neoplasm of connective and other soft tissue, unspecified: Secondary | ICD-10-CM | POA: Diagnosis not present

## 2023-08-12 NOTE — Progress Notes (Signed)
 James [name]    [id number]    February 18, 1976  Primary Care Physician:Quillen, Casimiro Needle, MD  Referring Physician: Celine Mans, MD [address] [place],  [place] [address]  Chief complaint:   Patient being seen for shortness of breath Recent chest x-ray  HPI:  Quit smoking last year  Was smoking about half a pack a day  Past history of asthma  Symptoms have been relatively stable  Continues to try to stay active  History of neurofibromatosis, with a lot of back pain and general discomfort  Activity level remains about the same, past history of hypertension, anxiety/depression,  Continues to use inhalers regularly  Abnormal CT scan of the chest showing what appears to be a fibroma, gradually increasing in size  Outpatient Encounter Medications as of 08/12/2023  Medication Sig   amLODipine-olmesartan (AZOR) 5-20 MG tablet Take 1 tablet by mouth daily.   atorvastatin (LIPITOR) 10 MG tablet Take 10 mg by mouth daily.   buPROPion (WELLBUTRIN XL) 150 MG 24 hr tablet TAKE 1 TABLET BY MOUTH EVERY DAY   docusate sodium (COLACE) 100 MG capsule Take 1 capsule (100 mg total) by mouth 2 (two) times daily.   famotidine (PEPCID) 20 MG tablet Take 1 tablet (20 mg total) by mouth 2 (two) times daily.   Fluticasone-Umeclidin-Vilant (TRELEGY ELLIPTA) 100-62.5-25 MCG/ACT AEPB Inhale 1 puff into the lungs daily.   gabapentin (NEURONTIN) 300 MG capsule Take 300mg  in the morning and 600mg  at bedtime x 1 week, then 600mg  twice daily. (Patient taking differently: Take 300 mg by mouth at bedtime.)   ibuprofen (ADVIL) 600 MG tablet TAKE 1 TABLET BY MOUTH EVERY 8 HOURS AS NEEDED (Patient taking differently: Take 600 mg by mouth in the morning and at bedtime.)   methocarbamol (ROBAXIN-750) 750 MG tablet Take 1 tablet (750 mg total) by mouth every 6 (six) hours as needed for muscle spasms.   ondansetron (ZOFRAN) 4 MG tablet Take 1 tablet (4 mg total) by mouth every 6 (six) hours.    oxyCODONE-acetaminophen (PERCOCET) 5-325 MG tablet Take 1 tablet by mouth every 4 (four) hours as needed for severe pain (pain score 7-10).   STIOLTO RESPIMAT 2.5-2.5 MCG/ACT AERS Inhale 2 puffs into the lungs daily.   VENTOLIN HFA 108 (90 Base) MCG/ACT inhaler TAKE 2 PUFFS BY MOUTH EVERY 6 HOURS AS NEEDED FOR WHEEZE OR SHORTNESS OF BREATH   No facility-administered encounter medications on file as of 08/12/2023.    Allergies as of 08/12/2023   (No Known Allergies)    Past Medical History:  Diagnosis Date   Alcohol dependence with uncomplicated withdrawal (HCC) 06/07/2017   Alcohol use disorder, moderate, dependence (HCC) 04/06/2018   Asthma    Black stools    Bronchitis    11/2011   COPD (chronic obstructive pulmonary disease) (HCC)    Difficulty sleeping    Dizziness    Emphysema of lung (HCC)    ETOH abuse    Sober since 03/2023   Former smoker    GERD (gastroesophageal reflux disease)    Hypertension goal BP (blood pressure) < 140/90 12/21/2011   Hypokalemia 12/28/2019   Neurofibromatosis, type 1 (HCC)    diagnosed 2013   Neuromuscular disorder (HCC) 2014   neurofibrosis   Shortness of breath    with exertion   Vomiting blood     Past Surgical History:  Procedure Laterality Date   COLONOSCOPY  2023   Dr. Murlean Iba   ESOPHAGOGASTRODUODENOSCOPY (EGD) WITH  PROPOFOL  05/14/2012   Procedure: ESOPHAGOGASTRODUODENOSCOPY (EGD) WITH PROPOFOL;  Surgeon: Willis Modena, MD;  Location: [facility];  Service: Endoscopy;  Laterality: N/A;   LUMBAR LAMINECTOMY/ DECOMPRESSION WITH MET-RX Left 07/02/2023   Procedure: Left Lumbar five-Sacral one Minimally Invasive Surgery microdiscectomy;  Surgeon: Bedelia Person, MD;  Location: [facility] OR;  Service: Neurosurgery;  Laterality: Left;  C3   UPPER GASTROINTESTINAL ENDOSCOPY      Family History  Problem Relation Age of Onset   Diabetes Mother    Hypertension Mother    Colon cancer Father    Hypertension Father    Diabetes Father     Prostate cancer Father    Neurofibromatosis Brother        deceased   Neurofibromatosis Daughter    Neurofibromatosis Daughter    Esophageal cancer Neg Hx    Rectal cancer Neg Hx    Inflammatory bowel disease Neg Hx    Liver disease Neg Hx    Pancreatic cancer Neg Hx    Stomach cancer Neg Hx    Colon polyps Neg Hx     Social History   Socioeconomic History   Marital status: Married    Spouse name: Asher Muir   Number of children: 3   Years of education: 11th   Highest education level: Not on file  Occupational History   Occupation: disablity  Tobacco Use   Smoking status: Former    Current packs/day: 0.50    Average packs/day: 0.5 packs/day for 21.2 years (10.6 ttl pk-yrs)    Types: Cigarettes    Start date: 05/28/2002    Passive exposure: Current   Smokeless tobacco: Never   Tobacco comments:    Quit smoking November 2024  Vaping Use   Vaping status: Never Used  Substance and Sexual Activity   Alcohol use: Not Currently    Comment: on special occasion   Drug use: No   Sexual activity: Yes  Other Topics Concern   Not on file  Social History Narrative   Unemployed. Recent patient of Quitman Livings, MD of [facility] transferred to our practice after being in jail and losing insurance.       Lives in his parents home with his wife and 2 kids. Completed some High School.    Best contact [phone] and ok to leave a message.       Quit smoking early 7/13. Quit ETOH abuse early 203 after being told he had an upset stomach lining.       Started back smoking 05/2012. 3 cigarettes daily      Caffeine use: very little      Patient is left-handed. He lives with his wife and children in a one level home. He does not regularly exercise.   Social Drivers of Corporate investment banker Strain: Not on file  Food Insecurity: Not on file  Transportation Needs: Not on file  Physical Activity: Not on file  Stress: Not on file  Social Connections: Not on file  Intimate  Partner Violence: Not on file    Review of Systems  Respiratory:  Positive for shortness of breath.     Vitals:   08/12/23 0916  BP: (!) 141/89  Pulse: 88  SpO2: 95%     Physical Exam Constitutional:      Appearance: Normal appearance.  HENT:     Head: Normocephalic.     Mouth/Throat:     Mouth: Mucous membranes are moist.  Eyes:     Pupils:  Pupils are equal, round, and reactive to light.  Cardiovascular:     Rate and Rhythm: Normal rate and regular rhythm.     Heart sounds: No murmur heard.    No friction rub.  Pulmonary:     Effort: No respiratory distress.     Breath sounds: No stridor. No wheezing or rhonchi.  Musculoskeletal:     Cervical back: No rigidity or tenderness.  Neurological:     Mental Status: He is alert.  Psychiatric:        Mood and Affect: Mood normal.     Data Reviewed: Previous spirometry from 2018 did not show obstruction  CT scan from 2017 is consistent with multiple bullae and emphysematous changes Possible fibroma left chest wall  Has a CT scan of the chest scheduled for January 24, previous alpha-1 antitrypsin levels of 122 and 148 from 5 and 10 years ago respectively  Assessment:  COPD/emphysema Has been doing relatively well -PFT with evidence of obstructive lung disease, significant bronchodilator response  Shortness of breath on exertion -Continues on current inhalers -Has been doing relatively well  Reformed smoker -Quit smoking over a year ago  Fibroma/nerve sheath tumor -Gradually increase in size -Appears asymptomatic -May be beneficial to have cardiothoracic surgery evaluate   Plan/Recommendations: Graded activities as tolerated  Continue current inhalers, continue Stiolto  Referral for evaluation by cardiothoracic surgery  Follow-up in 6 months  Encouraged to call with significant concerns   Virl Diamond MD [facility] 08/12/2023, 9:21 AM  CC: Celine Mans, MD

## 2023-08-12 NOTE — Patient Instructions (Addendum)
 Referred to cardiothoracic surgery for evaluation -Pleural-based growth -Gradually increase in size, noted on CT scan from 2013 -Likely a fibroma, nerve sheat tumor  Continue inhalers  Stay off cigarettes  Call us with significant concerns  Follow-up in 6 months  Your breathing study is consistent with your history of emphysema which we noted as we looked through your CAT scan today

## 2023-08-14 ENCOUNTER — Other Ambulatory Visit: Payer: Self-pay | Admitting: Pulmonary Disease

## 2023-08-14 ENCOUNTER — Other Ambulatory Visit: Payer: Self-pay | Admitting: Family Medicine

## 2023-08-14 DIAGNOSIS — R9389 Abnormal findings on diagnostic imaging of other specified body structures: Secondary | ICD-10-CM

## 2023-08-27 ENCOUNTER — Ambulatory Visit
Admission: RE | Admit: 2023-08-27 | Discharge: 2023-08-27 | Disposition: A | Discharge status: Home / Self Care | Source: Ambulatory Visit | Attending: Pulmonary Disease | Admitting: Pulmonary Disease

## 2023-08-27 DIAGNOSIS — R9389 Abnormal findings on diagnostic imaging of other specified body structures: Secondary | ICD-10-CM

## 2023-08-28 ENCOUNTER — Encounter: Payer: Self-pay | Admitting: Neurology

## 2023-08-28 ENCOUNTER — Ambulatory Visit: Payer: Medicaid Other | Admitting: Neurology

## 2023-08-28 VITALS — BP 129/83 | HR 99 | Ht 66.0 in | Wt 179.0 lb

## 2023-08-28 DIAGNOSIS — Q8501 Neurofibromatosis, type 1: Secondary | ICD-10-CM | POA: Diagnosis not present

## 2023-08-28 MED ORDER — GABAPENTIN 300 MG PO CAPS: 300.0000 mg | ORAL_CAPSULE | Freq: Two times a day (BID) | ORAL | 3 refills | Status: DC

## 2023-08-28 NOTE — Progress Notes (Signed)
 Follow-up Visit   Date: 08/28/23   James [name] MRN: [id number] DOB: [birth year]   Interim History: James [name] is a 48 y.o. left-handed male with COPD, GERD, prior alcohol abuse, and tobacco use returning to the clinic for follow-up of neurofibromatosis type 1.  The patient was accompanied to the clinic by self.  IMPRESSION/PLAN: Neurofibromatosis type 1 with cutaneous neurofibromas, cafe au lait spots, and neurofibromas involving the C8, T1, T12, and T3 nerve roots.  Stable on imaging from 2024.  - Continue gabapentin 300mg  twice daily  - Recommend annual eye exam  2.   Left S1 radiculopathy s/p L5-S1 laminotomy by Dr. Maisie Fus in Feb 2025 with residual numbness involving the left posterior leg.   3.  History of alcohol abuse, sober since February 2024.    4.  Tobacco cessation discussed, he stopped smoking a month ago.  Praised him for doing this and encouraged to keep it up.    Return to clinic in 1 year  ---------------------------------------------------------- History of present illness:  He was diagnosed with NF1 after evaluation for chest pain led to MRI thoracic spine showing neurofibromas involving the T2 and III nerve roots on the left. He has chronic sharp pain related to do, as if being punched.  He takes takes ibuprofen almost daily which provides intermittent relief. He intermittent numbness/tingling of the hands.   Over the past several years, there has been no new change to his back pain.  He denies any new vision changes, numbness or tingling, or weakness.  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  He has not been able to establish care with dermatology ophthalmology locally due to being on Medicaid.  He was previously evaluated by Dr. Marjory Lies in 2014 for neurofibromatosis type I.  At that time, he had new onset headaches and retro-orbital eye pain.  MRI of the brain and orbit was normal,  specifically no evidence of optic glioma.  Patient has strong family history of neurofibromatosis including his mother, brother (deceased) and 2 daughters. He has one son that has unaffected.    He was drinking a gallon liquor daily for 5 years, and cut back in January 2020 down to 2 beers daily.  He does not work and has been on disability since 2015 because of falls related to NF1.  He has been sober since 2022.  UPDATE 08/23/2021:    He is here for 1 year follow-up visit.  He continues to have achy low back pain and stiffness.  MRI lumbar spine from 2022 did not show any structural disease of the lumbar spine.  MRI thoracic spine showed stable neurofibromas at C8, T1, T2, and T3 nerve roots.  He denies shooting back pain or radiating pain down the legs.  No new visual complaints, numbness/tingling or weakness.   UPDATE 08/27/2022:  He is here for 1 year follow-up visit.  He reports having ongoing right arm burning/tingling pain which has been the same for the past several years.  No new neurological complaints, such as weakness or new paresthesias.  No interval falls or hospitalizations.  He has been sober for the past 2 months, due to pressure from his mother and children, two of which also have NF.   UPDATE 08/28/2023:  He is here for 1 year follow-up visit.  He underwent left L5-S1 laminotomy for S1 radiculopathy and back pain has improved, but continues to have numbness involving the  back of the left leg.  He denies tingling or pain involving the arms or legs.  No new neurological complaints.  He continues to remain sober and also quit smoking last month.   Medications:  Current Outpatient Medications on File Prior to Visit  Medication Sig Dispense Refill   amLODipine-olmesartan (AZOR) 5-20 MG tablet Take 1 tablet by mouth daily.     atorvastatin (LIPITOR) 10 MG tablet Take 10 mg by mouth daily.     buPROPion (WELLBUTRIN XL) 150 MG 24 hr tablet TAKE 1 TABLET BY MOUTH EVERY DAY 90 tablet 1    docusate sodium (COLACE) 100 MG capsule Take 1 capsule (100 mg total) by mouth 2 (two) times daily. 30 capsule 0   famotidine (PEPCID) 20 MG tablet Take 1 tablet (20 mg total) by mouth 2 (two) times daily. 30 tablet 0   Fluticasone-Umeclidin-Vilant (TRELEGY ELLIPTA) 100-62.5-25 MCG/ACT AEPB Inhale 1 puff into the lungs daily. 60 each 5   gabapentin (NEURONTIN) 300 MG capsule Take 300mg  in the morning and 600mg  at bedtime x 1 week, then 600mg  twice daily. (Patient taking differently: Take 300 mg by mouth at bedtime.) 120 capsule 3   ibuprofen (ADVIL) 600 MG tablet TAKE 1 TABLET BY MOUTH EVERY 8 HOURS AS NEEDED (Patient taking differently: Take 600 mg by mouth in the morning and at bedtime.) 60 tablet 1   methocarbamol (ROBAXIN-750) 750 MG tablet Take 1 tablet (750 mg total) by mouth every 6 (six) hours as needed for muscle spasms. 120 tablet 0   ondansetron (ZOFRAN) 4 MG tablet Take 1 tablet (4 mg total) by mouth every 6 (six) hours. 30 tablet 0   oxyCODONE-acetaminophen (PERCOCET) 5-325 MG tablet Take 1 tablet by mouth every 4 (four) hours as needed for severe pain (pain score 7-10). 20 tablet 0   STIOLTO RESPIMAT 2.5-2.5 MCG/ACT AERS Inhale 2 puffs into the lungs daily.     VENTOLIN HFA 108 (90 Base) MCG/ACT inhaler TAKE 2 PUFFS BY MOUTH EVERY 6 HOURS AS NEEDED FOR WHEEZE OR SHORTNESS OF BREATH 18 each 1   No current facility-administered medications on file prior to visit.    Allergies: No Known Allergies  Vital Signs:  BP 129/83   Pulse 99   Ht 5\' 6"  (1.676 m)   Wt 179 lb (81.2 kg)   SpO2 96%   BMI 28.89 kg/m   Neurological Exam: MENTAL STATUS including orientation to time, place, person, recent and remote memory, attention span and concentration, language, and fund of knowledge is normal.  Speech is not dysarthric.  CRANIAL NERVES:   Pupils equal round and reactive to light.  Normal conjugate, extra-ocular eye movements in all directions of gaze.  No ptosis.  Face is symmetric.    MOTOR:  Motor strength is 5/5 in all extremities.  No atrophy, fasciculations or abnormal movements.  No pronator drift.  Tone is normal.    MSRs:  Reflexes are 2+/4 throughout.  SENSORY:  Intact to vibration throughout.  COORDINATION/GAIT:  Normal finger-to- nose-finger.  Intact rapid alternating movements bilaterally.  Gait appears, stable, unassisted.  Data: MRI thoracic spine 01/25/2023: 1. Unchanged neurofibromas along the right C8 and T1 nerves and left T2 and T3 nerves with associated remodeling of the T2 and T3 vertebral bodies. 2. Increased left central disc protrusion at L5-S1 displacing the traversing left S1 nerve root in the subarticular zone. 3. Unchanged expansile, heterogeneous lesion in the left iliac bone, probably benign.   MRI lumbar spine wwo contrast 01/25/2023: 1. Unchanged  neurofibromas along the right C8 and T1 nerves and left T2 and T3 nerves with associated remodeling of the T2 and T3 vertebral bodies. 2. Increased left central disc protrusion at L5-S1 displacing the traversing left S1 nerve root in the subarticular zone. 3. Unchanged expansile, heterogeneous lesion in the left iliac bone, probably benign.    MRI thoracic and lumbar spine wwo contrast 1/28/20220: 1. Stable neurofibromas at the T2 and T3 nerve roots on the left and at C8 and T1 on the right. 2. Stable vertebral body erosions at T2 and T3. 3. Stable mild degenerative changes of the lumbar spine without significant spinal canal or neural foraminal stenosis. 4. Stable expansile left iliac wing lesion, probably benign.    Thank you for allowing me to participate in patient's care.  If I can answer any additional questions, I would be pleased to do so.    Sincerely,    Romone Shaff K. Allena Katz, DO

## 2023-08-28 NOTE — Patient Instructions (Signed)
Continue gabapentin 300 mg twice daily

## 2023-09-12 ENCOUNTER — Encounter: Payer: Self-pay | Admitting: Thoracic Surgery (Cardiothoracic Vascular Surgery)

## 2023-09-12 ENCOUNTER — Institutional Professional Consult (permissible substitution): Admitting: Thoracic Surgery (Cardiothoracic Vascular Surgery)

## 2023-09-12 VITALS — BP 138/91 | HR 94 | Resp 20 | Ht 66.0 in | Wt 173.4 lb

## 2023-09-12 DIAGNOSIS — J948 Other specified pleural conditions: Secondary | ICD-10-CM | POA: Diagnosis not present

## 2023-09-12 NOTE — Progress Notes (Signed)
 [address]       [place] [address]             [phone]     HPI: Mr. Pavon is sent for consultation regarding a pleural mass.  Uziah Sorter is a 48 year old man with a history of neurofibromatosis type I, ethanol abuse, tobacco abuse, COPD, and chronic back pain.  Manifestations of his neurofibromatosis include cutaneous neurofibromas, caf au lait spots, neurofibromas involving C8, T1, T2, and T3 nerve roots, and a left pleural mass.  He recently had a CT of the chest which showed no change in the smoothly marginated pleural-based mass in the left anterior lateral second interspace.  Also no change in the neurofibromas affecting C8, T1, T2, and T3 nerve roots.  He complains of severe back pain.  He had a L5-S1 laminotomy and microdiscectomy in February.  Had some pain relief initially but now says his pain is back to baseline.  Complains of pain "all up and down" his back and left leg.  Past Medical History:  Diagnosis Date   Alcohol dependence with uncomplicated withdrawal (HCC) 06/07/2017   Alcohol use disorder, moderate, dependence (HCC) 04/06/2018   Asthma    Black stools    Bronchitis    11/2011   COPD (chronic obstructive pulmonary disease) (HCC)    Difficulty sleeping    Dizziness    Emphysema of lung (HCC)    ETOH abuse    Sober since 03/2023   Former smoker    GERD (gastroesophageal reflux disease)    Hypertension goal BP (blood pressure) < 140/90 12/21/2011   Hypokalemia 12/28/2019   Neurofibromatosis, type 1 (HCC)    diagnosed 2013   Neuromuscular disorder (HCC) 2014   neurofibrosis   Shortness of breath    with exertion   Vomiting blood    Past Surgical History:  Procedure Laterality Date   COLONOSCOPY  2023   Dr. Murlean Iba   ESOPHAGOGASTRODUODENOSCOPY (EGD) WITH PROPOFOL  05/14/2012   Procedure: ESOPHAGOGASTRODUODENOSCOPY (EGD) WITH PROPOFOL;  Surgeon: Willis Modena, MD;  Location: [facility];  Service: Endoscopy;   Laterality: N/A;   LUMBAR LAMINECTOMY/ DECOMPRESSION WITH MET-RX Left 07/02/2023   Procedure: Left Lumbar five-Sacral one Minimally Invasive Surgery microdiscectomy;  Surgeon: Bedelia Person, MD;  Location: [facility];  Service: Neurosurgery;  Laterality: Left;  C3   UPPER GASTROINTESTINAL ENDOSCOPY      Current Outpatient Medications  Medication Sig Dispense Refill   amLODipine-olmesartan (AZOR) 5-20 MG tablet Take 1 tablet by mouth daily.     atorvastatin (LIPITOR) 10 MG tablet Take 10 mg by mouth daily.     buPROPion (WELLBUTRIN XL) 150 MG 24 hr tablet TAKE 1 TABLET BY MOUTH EVERY DAY 90 tablet 1   docusate sodium (COLACE) 100 MG capsule Take 1 capsule (100 mg total) by mouth 2 (two) times daily. 30 capsule 0   famotidine (PEPCID) 20 MG tablet Take 1 tablet (20 mg total) by mouth 2 (two) times daily. 30 tablet 0   Fluticasone-Umeclidin-Vilant (TRELEGY ELLIPTA) 100-62.5-25 MCG/ACT AEPB Inhale 1 puff into the lungs daily. 60 each 5   gabapentin (NEURONTIN) 300 MG capsule Take 1 capsule (300 mg total) by mouth 2 (two) times daily. 180 capsule 3   ibuprofen (ADVIL) 600 MG tablet TAKE 1 TABLET BY MOUTH EVERY 8 HOURS AS NEEDED (Patient taking differently: Take 600 mg by mouth in the morning and at bedtime.) 60 tablet 1   methocarbamol (ROBAXIN-750) 750 MG tablet Take 1  tablet (750 mg total) by mouth every 6 (six) hours as needed for muscle spasms. 120 tablet 0   ondansetron (ZOFRAN) 4 MG tablet Take 1 tablet (4 mg total) by mouth every 6 (six) hours. 30 tablet 0   oxyCODONE-acetaminophen (PERCOCET) 5-325 MG tablet Take 1 tablet by mouth every 4 (four) hours as needed for severe pain (pain score 7-10). 20 tablet 0   STIOLTO RESPIMAT 2.5-2.5 MCG/ACT AERS Inhale 2 puffs into the lungs daily.     VENTOLIN HFA 108 (90 Base) MCG/ACT inhaler TAKE 2 PUFFS BY MOUTH EVERY 6 HOURS AS NEEDED FOR WHEEZE OR SHORTNESS OF BREATH 18 each 1   No current facility-administered medications for this visit.   Review of  systems Shortness of breath at rest and with exertion Chest pain at rest and with exertion Dizziness Heartburn Left leg pain Back pain Numbness in left leg and foot Anxiety Blurry vision All other systems are negative  Physical Exam Vitals reviewed.  Constitutional:      General: He is in acute distress (Obvious discomfort with sitting up).  HENT:     Head: Normocephalic and atraumatic.  Eyes:     Extraocular Movements: Extraocular movements intact.  Cardiovascular:     Rate and Rhythm: Normal rate and regular rhythm.     Heart sounds: Normal heart sounds. No murmur heard. Pulmonary:     Effort: Pulmonary effort is normal. No respiratory distress.     Breath sounds: Normal breath sounds.  Skin:    General: Skin is warm and dry.     Comments: Multiple nodules  Neurological:     General: No focal deficit present.     Mental Status: He is alert and oriented to person, place, and time.     Cranial Nerves: No cranial nerve deficit.    Diagnostic Tests: CT CHEST WITHOUT CONTRAST   TECHNIQUE: Multidetector CT imaging of the chest was performed following the standard protocol without IV contrast.   RADIATION DOSE REDUCTION: This exam was performed according to the departmental dose-optimization program which includes automated exposure control, adjustment of the mA and/or kV according to patient size and/or use of iterative reconstruction technique.   COMPARISON:  Chest x-ray a September 23 24, CT chest June 24, 2022   FINDINGS: Cardiovascular: No significant vascular findings. Normal heart size. No pericardial effusion. No significant coronary artery calcifications. No pericardial effusion   Mediastinum/Nodes: No enlarged mediastinal or axillary lymph nodes. Thyroid gland, trachea, and esophagus demonstrate no significant findings.   Lungs/Pleura: Extensive chronic interstitial lung disease with centrilobular and paraseptal emphysema with large bullous at  the level of the right upper lobe paramediastinal distribution stable since prior examination.   Comparison with prior examinations demonstrates no significant change in the previously described ovoid pleural mass in the anterolateral left second intercostal space measuring 2.4 x 1.5 cm.   No change in the skin lesion anterior superior right chest wall measuring 1.3 by 0.7 cm unchanged since prior examination.   Several other small kidney lesions are noted within the anterior chest wall measuring a few mm and this raises the possibility of neurofibromatosis. Correlate clinically.   Upper Abdomen: Small gallstones correlate with chronic cholelithiasis.   Musculoskeletal: No chest wall mass or suspicious bone lesions identified. No change in the appearance of the upper thoracic vertebral bodies T1 and T2 as described on the prior dictation.   IMPRESSION: Stable CT chest. No significant change compared with prior examination stable left upper chest pleural lesion or benign-appearing  soft tissue mass. Findings above described may correlate with neurofibromatosis. See comments above. No acute findings.     Electronically Signed   By: Fredrich Jefferson M.D.   On: 08/28/2023 10:18 I personally reviewed the CT images.  No change in the anterior lateral left second interspace chest wall/pleural-based mass.  No change in the posterior mass or vertebral bodies.  Impression: Nigel Ericsson is a 48 year old man with a history of neurofibromatosis type I, ethanol abuse, tobacco abuse, COPD, and chronic back pain.  Manifestations of his neurofibromatosis include cutaneous neurofibromas, caf au lait spots, neurofibromas involving C8, T1, T2, and T3 nerve roots, and a left pleural mass.  Left anterior lateral second interspace chest wall/pleural mass-smoothly marginated.  No change in size over the past year.  Has been present dating back to a CT on 2013.  It has increased in size since then but only  very mild increase since 2017.  No indication for surgery.  Would plan to monitor with a CT in a year.  Neurofibromas involving C8, T1, T2, and T3 nerve roots-stable radiographically.  His pain is primarily lower thoracic and lumbar with radiation to his leg.  I doubt that these are causing any of his symptoms.  Plan: Return in a year with a CT chest  Zelphia Higashi, MD [facility] [phone]

## 2023-09-27 ENCOUNTER — Other Ambulatory Visit: Payer: Self-pay | Admitting: Family Medicine

## 2023-10-01 ENCOUNTER — Emergency Department (HOSPITAL_COMMUNITY): Admission: EM | Admit: 2023-10-01 | Discharge: 2023-10-01 | Disposition: A | Discharge status: Home / Self Care

## 2023-10-01 ENCOUNTER — Emergency Department (HOSPITAL_COMMUNITY)

## 2023-10-01 ENCOUNTER — Other Ambulatory Visit: Payer: Self-pay

## 2023-10-01 ENCOUNTER — Encounter (HOSPITAL_COMMUNITY): Payer: Self-pay | Admitting: Emergency Medicine

## 2023-10-01 DIAGNOSIS — J449 Chronic obstructive pulmonary disease, unspecified: Secondary | ICD-10-CM | POA: Diagnosis not present

## 2023-10-01 DIAGNOSIS — Z7951 Long term (current) use of inhaled steroids: Secondary | ICD-10-CM | POA: Diagnosis not present

## 2023-10-01 DIAGNOSIS — Z79899 Other long term (current) drug therapy: Secondary | ICD-10-CM | POA: Insufficient documentation

## 2023-10-01 DIAGNOSIS — R079 Chest pain, unspecified: Secondary | ICD-10-CM

## 2023-10-01 DIAGNOSIS — E876 Hypokalemia: Secondary | ICD-10-CM | POA: Insufficient documentation

## 2023-10-01 DIAGNOSIS — I10 Essential (primary) hypertension: Secondary | ICD-10-CM | POA: Diagnosis not present

## 2023-10-01 DIAGNOSIS — R0789 Other chest pain: Secondary | ICD-10-CM | POA: Diagnosis present

## 2023-10-01 LAB — BASIC METABOLIC PANEL WITH GFR
Anion gap: 14 (ref 5–15)
BUN: 6 mg/dL (ref 6–20)
CO2: 18 mmol/L — ABNORMAL LOW (ref 22–32)
Calcium: 9.1 mg/dL (ref 8.9–10.3)
Chloride: 105 mmol/L (ref 98–111)
Creatinine, Ser: 0.81 mg/dL (ref 0.61–1.24)
GFR, Estimated: >60 mL/min (ref >60)
Glucose, Bld: 102 mg/dL — ABNORMAL HIGH (ref 70–99)
Potassium: 2.9 mmol/L — ABNORMAL LOW (ref 3.5–5.1)
Sodium: 137 mmol/L (ref 135–145)

## 2023-10-01 LAB — TROPONIN I (HIGH SENSITIVITY)
Troponin I (High Sensitivity): <2 ng/L (ref <18)
Troponin I (High Sensitivity): <2 ng/L (ref <18)

## 2023-10-01 LAB — CBC
HCT: 42.2 % (ref 39.0–52.0)
Hemoglobin: 14.3 g/dL (ref 13.0–17.0)
MCH: 30.3 pg (ref 26.0–34.0)
MCHC: 33.9 g/dL (ref 30.0–36.0)
MCV: 89.4 fL (ref 80.0–100.0)
Platelets: 201 10*3/uL (ref 150–400)
RBC: 4.72 MIL/uL (ref 4.22–5.81)
RDW: 12.1 % (ref 11.5–15.5)
WBC: 9.8 10*3/uL (ref 4.0–10.5)
nRBC: 0 % (ref 0.0–0.2)

## 2023-10-01 LAB — D-DIMER, QUANTITATIVE: D-Dimer, Quant: <0.27 ug{FEU}/mL (ref 0.00–0.50)

## 2023-10-01 MED ORDER — POTASSIUM CHLORIDE CRYS ER 20 MEQ PO TBCR
40.0000 meq | EXTENDED_RELEASE_TABLET | Freq: Once | ORAL | Status: AC
  Administered 2023-10-01: 40 meq via ORAL
  Filled 2023-10-01: qty 2

## 2023-10-01 MED ORDER — KETOROLAC TROMETHAMINE 15 MG/ML IJ SOLN
15.0000 mg | Freq: Once | INTRAMUSCULAR | Status: AC
  Administered 2023-10-01: 15 mg via INTRAVENOUS
  Filled 2023-10-01: qty 1

## 2023-10-01 MED ORDER — IPRATROPIUM-ALBUTEROL 0.5-2.5 (3) MG/3ML IN SOLN
3.0000 mL | Freq: Once | RESPIRATORY_TRACT | Status: AC
  Administered 2023-10-01: 3 mL via RESPIRATORY_TRACT
  Filled 2023-10-01: qty 3

## 2023-10-01 NOTE — ED Provider Notes (Signed)
 [facility] EMERGENCY DEPARTMENT AT [facility] Provider Note   CSN: [id number] Arrival date & time: 10/01/23  0800     History  Chief Complaint  Patient presents with   Chest Pain    TRUMAINE [name] is a 48 y.o. male.  48 year old male presenting emergency department for right-sided chest pain.  Symptoms started yesterday.  Sharp, pleuritic type pain.  Has a history of COPD not on oxygen.  He is not currently short of breath.  No known trauma or injury.  Chest pain does not radiate.  No nausea vomiting.  No palpitations.  Reportedly had spinal surgery a few months ago, but otherwise low risk for PE based on Wells criteria   Chest Pain      Home Medications Prior to Admission medications   Medication Sig Start Date End Date Taking? Authorizing Provider  amLODipine -olmesartan (AZOR) 5-20 MG tablet Take 1 tablet by mouth daily. 11/02/22  Yes [provider]  atorvastatin (LIPITOR) 10 MG tablet Take 10 mg by mouth daily. 05/10/23  Yes [provider]  famotidine  (PEPCID ) 20 MG tablet Take 1 tablet (20 mg total) by mouth 2 (two) times daily. Patient taking differently: Take 20 mg by mouth daily. 11/08/22  Yes Everlyn Hockey, PA-C  gabapentin  (NEURONTIN ) 300 MG capsule Take 1 capsule (300 mg total) by mouth 2 (two) times daily. 08/28/23  Yes Patel, Donika K, DO  ibuprofen  (ADVIL ) 600 MG tablet TAKE 1 TABLET BY MOUTH EVERY 8 HOURS AS NEEDED Patient taking differently: Take 600 mg by mouth in the morning and at bedtime. 01/26/22  Yes Joelle Musca, MD  methocarbamol  (ROBAXIN -750) 750 MG tablet Take 1 tablet (750 mg total) by mouth every 6 (six) hours as needed for muscle spasms. 07/02/23  Yes Easter Golden Caylin, PA-C  nicotine  (NICODERM CQ  - DOSED IN MG/24 HOURS) 21 mg/24hr patch Place 21 mg onto the skin daily. 08/12/23  Yes [provider]  STIOLTO RESPIMAT  2.5-2.5 MCG/ACT AERS Inhale 2 puffs into the lungs daily. 06/12/23  Yes [provider]   VENTOLIN  HFA 108 (90 Base) MCG/ACT inhaler TAKE 2 PUFFS BY MOUTH EVERY 6 HOURS AS NEEDED FOR WHEEZE OR SHORTNESS OF BREATH 08/15/23  Yes Ivin Marrow, MD  Cholecalciferol (VITAMIN D3) 1.25 MG (50000 UT) CAPS Take 1 capsule by mouth once a week. Patient not taking: Reported on 10/01/2023 08/06/23   [provider]  Fluticasone -Umeclidin-Vilant (TRELEGY ELLIPTA ) 100-62.5-25 MCG/ACT AEPB Inhale 1 puff into the lungs daily. Patient not taking: Reported on 10/01/2023 01/18/23   Antonio Baumgarten, NP      Allergies    Patient has no known allergies.    Review of Systems   Review of Systems  Cardiovascular:  Positive for chest pain.    Physical Exam Updated Vital Signs BP (!) 144/105   Pulse 89   Temp 98.4 F (36.9 C)   Resp (!) 24   Ht 5\' 6"  (1.676 m)   Wt 69.9 kg   SpO2 96%   BMI 24.86 kg/m  Physical Exam Vitals and nursing note reviewed.  Constitutional:      General: He is not in acute distress.    Appearance: He is not toxic-appearing.  Cardiovascular:     Rate and Rhythm: Normal rate and regular rhythm.     Heart sounds: Normal heart sounds.  Pulmonary:     Effort: Pulmonary effort is normal.     Breath sounds: Normal breath sounds.  Chest:     Chest  wall: No tenderness.  Musculoskeletal:        General: Normal range of motion.     Cervical back: Normal range of motion.  Skin:    General: Skin is warm and dry.     Capillary Refill: Capillary refill takes less than 2 seconds.  Neurological:     General: No focal deficit present.     Mental Status: He is alert.  Psychiatric:        Mood and Affect: Mood normal.        Behavior: Behavior normal.     ED Results / Procedures / Treatments   Labs (all labs ordered are listed, but only abnormal results are displayed) Labs Reviewed  BASIC METABOLIC PANEL WITH GFR - Abnormal; Notable for the following components:      Result Value   Potassium 2.9 (*)    CO2 18 (*)    Glucose, Bld 102 (*)    All other  components within normal limits  CBC  D-DIMER, QUANTITATIVE  TROPONIN I (HIGH SENSITIVITY)  TROPONIN I (HIGH SENSITIVITY)    EKG EKG Interpretation Date/Time:  Tuesday Oct 01 2023 08:05:18 EDT Ventricular Rate:  87 PR Interval:  166 QRS Duration:  84 QT Interval:  346 QTC Calculation: 417 R Axis:   51  Text Interpretation: Sinus rhythm Ventricular premature complex Confirmed by Elise Guile [phone]) on 10/01/2023 11:45:06 AM  Radiology DG Chest 2 View Result Date: 10/01/2023 CLINICAL DATA:  Chest pain EXAM: CHEST - 2 VIEW COMPARISON:  Chest x-ray performed February 18, 2023 FINDINGS: Interstitial markings are present with apical scarring and pleuroparenchymal thickening noted. Heart mediastinum are not significantly changed. Stable appearance of left-sided pleural based mass. IMPRESSION: 1. Imaging features suggestive of chronic lung disease/emphysema with right apical scarring. 2. Stable appearance of left pleural based mass. Electronically Signed   By: Reagan Camera M.D.   On: 10/01/2023 08:43    Procedures Procedures    Medications Ordered in ED Medications  ipratropium-albuterol  (DUONEB) 0.5-2.5 (3) MG/3ML nebulizer solution 3 mL (3 mLs Nebulization Given 10/01/23 0951)  potassium chloride  SA (KLOR-CON  M) CR tablet 40 mEq (40 mEq Oral Given 10/01/23 1032)  ketorolac  (TORADOL ) 15 MG/ML injection 15 mg (15 mg Intravenous Given 10/01/23 1154)    ED Course/ Medical Decision Making/ A&P Clinical Course as of 10/01/23 1603  Tue Oct 01, 2023  0936 D-Dimer, Quant: <0.27 PE unlikely [TY]  1144 Troponin I (High Sensitivity): <2 Negative x 2.  ACS unlikely [TY]  1144 CBC No leukocytosis to suggest systemic infection.  No anemia. [TY]  1144 Basic metabolic panel(!) Low potassium.  Was repleted.  Appears that has been low in the past, possibly further lowered by breathing treatment today.  Patient will follow-up with primary doctor [TY]  1144 DG Chest 2 View No pneumonia pneumothorax on  my independent interpretation. [TY]  1145 EKG 12-Lead On my interpretation normal sinus rhythm, PVC noted.  No ST segment changes to indicate ischemia.  Normal intervals.  QTc 417. [TY]  1145 Given patient's reassuring workup feel that he is stable for discharge with outpatient follow-up.  Suspect symptoms secondary to pleuritits.  Given Toradol .  Discussed over-the-counter medications Tylenol  alternating with ibuprofen .  Given return precautions. [TY]    Clinical Course User Index [TY] Rolinda Climes, DO                                 Medical Decision  Making 48 year old male presenting emergency department for chest pain.  History of COPD not on oxygen, hypertension neurofibromatosis and alcohol use disorder.  He is afebrile nontachycardic, slightly hypertensive.  Does not appear to be in overt respiratory distress.  His lungs are largely clear.  EKG on my independent interpretation does not have ST segment changes to indicate ischemia.  Will get cardiac screening labs.  Does have recent surgery in February, but otherwise low risk for PE.  Will get D-dimer to evaluate.  Will trial DuoNeb in case there is a reactive airway component.  Chest x-ray on my independent review without pneumothorax, no obvious pneumonia.  Amount and/or Complexity of Data Reviewed External Data Reviewed:     Details: Does not appear to have any cardiac workup. Labs: ordered. Decision-making details documented in ED Course.    Details: See ED course Radiology: ordered and independent interpretation performed. Decision-making details documented in ED Course. ECG/medicine tests: independent interpretation performed. Decision-making details documented in ED Course.  Risk Prescription drug management. Decision regarding hospitalization. Diagnosis or treatment significantly limited by social determinants of health.          Final Clinical Impression(s) / ED Diagnoses Final diagnoses:  Chest pain, unspecified  type  Hypokalemia    Rx / DC Orders ED Discharge Orders     None         Rolinda Climes, DO 10/01/23 1603

## 2023-10-01 NOTE — Discharge Instructions (Signed)
 You may take Tylenol  alternating with ibuprofen  with dosages as directed on the packaging every 4 hours for pain.  Please follow-up with your primary doctor for further testing and evaluation.  It does appear that your potassium is low, again please follow-up with your primary doctor for retesting.  Return immediately if develop fevers, chills, worsening chest pain, shortness of breath, palpitations, passout, severe pain, nausea vomiting or any new or worsening symptoms that are concerning to you.

## 2023-10-01 NOTE — ED Triage Notes (Signed)
 Pt BIB by EMS for right-sided chest discomfort that worsens with breathing. Hx of COPD, not on any baseline oxygen requirements. Pt denies any trauma or injury. Pain does not move or radiate. Took OTC medications prior to arrival with no relief. Denies any SHOB at this time.  EMS VS: 138/100 100 98% RA

## 2023-10-02 ENCOUNTER — Ambulatory Visit
Admission: RE | Admit: 2023-10-02 | Discharge: 2023-10-02 | Disposition: A | Discharge status: Home / Self Care | Source: Ambulatory Visit | Attending: Physician Assistant | Admitting: Physician Assistant

## 2023-10-02 ENCOUNTER — Other Ambulatory Visit: Payer: Self-pay | Admitting: Physician Assistant

## 2023-10-02 DIAGNOSIS — R0781 Pleurodynia: Secondary | ICD-10-CM

## 2023-11-14 ENCOUNTER — Other Ambulatory Visit: Payer: Self-pay | Admitting: Family Medicine

## 2023-12-31 ENCOUNTER — Other Ambulatory Visit: Payer: Self-pay | Admitting: Neurology

## 2024-01-01 NOTE — Telephone Encounter (Signed)
 Clarification is Gabapentin  300mg  1 tab po bid is the correct dose, thank you

## 2024-03-16 ENCOUNTER — Other Ambulatory Visit: Payer: Self-pay | Admitting: Family Medicine

## 2024-05-23 ENCOUNTER — Emergency Department (HOSPITAL_COMMUNITY): Admission: EM | Admit: 2024-05-23 | Discharge: 2024-05-24

## 2024-05-23 ENCOUNTER — Other Ambulatory Visit: Payer: Self-pay

## 2024-05-23 ENCOUNTER — Encounter (HOSPITAL_COMMUNITY): Payer: Self-pay

## 2024-05-23 ENCOUNTER — Emergency Department (HOSPITAL_COMMUNITY)

## 2024-05-23 DIAGNOSIS — Z5321 Procedure and treatment not carried out due to patient leaving prior to being seen by health care provider: Secondary | ICD-10-CM | POA: Insufficient documentation

## 2024-05-23 DIAGNOSIS — R519 Headache, unspecified: Secondary | ICD-10-CM | POA: Insufficient documentation

## 2024-05-23 LAB — BASIC METABOLIC PANEL WITH GFR
Anion gap: 11 (ref 5–15)
BUN: 8 mg/dL (ref 6–20)
CO2: 25 mmol/L (ref 22–32)
Calcium: 9.1 mg/dL (ref 8.9–10.3)
Chloride: 105 mmol/L (ref 98–111)
Creatinine, Ser: 0.84 mg/dL (ref 0.61–1.24)
GFR, Estimated: >60 mL/min
Glucose, Bld: 145 mg/dL — ABNORMAL HIGH (ref 70–99)
Potassium: 3.3 mmol/L — ABNORMAL LOW (ref 3.5–5.1)
Sodium: 141 mmol/L (ref 135–145)

## 2024-05-23 LAB — CBC
HCT: 40.9 % (ref 39.0–52.0)
Hemoglobin: 14 g/dL (ref 13.0–17.0)
MCH: 31.3 pg (ref 26.0–34.0)
MCHC: 34.2 g/dL (ref 30.0–36.0)
MCV: 91.5 fL (ref 80.0–100.0)
Platelets: 226 K/uL (ref 150–400)
RBC: 4.47 MIL/uL (ref 4.22–5.81)
RDW: 11.8 % (ref 11.5–15.5)
WBC: 8.8 K/uL (ref 4.0–10.5)
nRBC: 0 % (ref 0.0–0.2)

## 2024-05-23 MED ORDER — IOHEXOL 350 MG/ML SOLN
75.0000 mL | Freq: Once | INTRAVENOUS | Status: AC | PRN
  Administered 2024-05-23: 75 mL via INTRAVENOUS

## 2024-05-23 NOTE — ED Triage Notes (Signed)
 Pt gives verbal consent for mse

## 2024-05-23 NOTE — ED Provider Triage Note (Signed)
 Emergency Medicine Provider Triage Evaluation Note  James [name] , a 48 y.o. male  was evaluated in triage.  Pt complains of headache.  Describes right sided headache for 2 weeks that is pulsating and associated with blurred vision.  Denies any known trigger/injury/inciting event.  No upper/lower extremity weakness/numbness/tingling, no nausea/vomiting.  No history of any neurologic conditions.  Has tried ibuprofen  at home with no relief of symptoms.  No fever.  Review of Systems  Positive: As above Negative: As above  Physical Exam  BP (!) 162/109 (BP Location: Right Arm)   Pulse 96   Temp (!) 97.5 F (36.4 C)   Resp 19   SpO2 97%  Gen:   Awake, no distress   Resp:  Normal effort  MSK:   Moves extremities without difficulty  Other:  No focal deficits on exam  Medical Decision Making  Medically screening exam initiated at 5:56 PM.  Appropriate orders placed.  James [name] was informed that the remainder of the evaluation will be completed by another provider, this initial triage assessment does not replace that evaluation, and the importance of remaining in the ED until their evaluation is complete.     James [name], [place] 05/23/24 1757

## 2024-05-23 NOTE — ED Triage Notes (Signed)
 Quick triage note: Pt reports HA x 2 weeks.

## 2024-05-24 NOTE — ED Notes (Signed)
 Patient left AMA and IV taken out

## 2024-05-24 NOTE — ED Notes (Signed)
 PT left MIA

## 2024-09-02 ENCOUNTER — Ambulatory Visit: Admitting: Neurology
